# Patient Record
Sex: Female | Born: 1965 | Race: White | Hispanic: No | Marital: Single | Smoking: Never smoker
Health system: Southern US, Community
[De-identification: ages and names within clinical notes are randomized; demographics above are authoritative.]

## PROBLEM LIST (undated history)

## (undated) DIAGNOSIS — C50919 Malignant neoplasm of unspecified site of unspecified female breast: Secondary | ICD-10-CM

## (undated) DIAGNOSIS — Z9289 Personal history of other medical treatment: Secondary | ICD-10-CM

## (undated) DIAGNOSIS — E039 Hypothyroidism, unspecified: Secondary | ICD-10-CM

## (undated) DIAGNOSIS — M7120 Synovial cyst of popliteal space [Baker], unspecified knee: Secondary | ICD-10-CM

## (undated) DIAGNOSIS — Z923 Personal history of irradiation: Secondary | ICD-10-CM

## (undated) DIAGNOSIS — Z9221 Personal history of antineoplastic chemotherapy: Secondary | ICD-10-CM

## (undated) HISTORY — DX: Personal history of other medical treatment: Z92.89

## (undated) HISTORY — DX: Synovial cyst of popliteal space (Baker), unspecified knee: M71.20

## (undated) HISTORY — PX: BREAST LUMPECTOMY: SHX2

## (undated) HISTORY — PX: REDUCTION MAMMAPLASTY: SUR839

## (undated) HISTORY — PX: BREAST BIOPSY: SHX20

## (undated) HISTORY — PX: TONSILLECTOMY: SUR1361

---

## 2004-11-30 ENCOUNTER — Ambulatory Visit: Payer: Self-pay | Admitting: Internal Medicine

## 2004-12-07 ENCOUNTER — Ambulatory Visit: Payer: Self-pay | Admitting: Internal Medicine

## 2005-09-16 ENCOUNTER — Ambulatory Visit: Payer: Self-pay | Admitting: Internal Medicine

## 2005-09-23 ENCOUNTER — Ambulatory Visit: Payer: Self-pay | Admitting: Internal Medicine

## 2005-11-21 ENCOUNTER — Ambulatory Visit: Payer: Self-pay | Admitting: Internal Medicine

## 2006-01-23 ENCOUNTER — Ambulatory Visit: Payer: Self-pay | Admitting: Internal Medicine

## 2006-05-08 ENCOUNTER — Ambulatory Visit: Payer: Self-pay | Admitting: Internal Medicine

## 2006-05-09 ENCOUNTER — Encounter: Admission: RE | Admit: 2006-05-09 | Discharge: 2006-05-09 | Payer: Self-pay | Admitting: Internal Medicine

## 2007-04-16 ENCOUNTER — Ambulatory Visit: Payer: Self-pay | Admitting: Internal Medicine

## 2007-04-17 LAB — CONVERTED CEMR LAB
CO2: 26 meq/L (ref 19–32)
Chloride: 111 meq/L (ref 96–112)
Creatinine, Ser: 0.8 mg/dL (ref 0.4–1.2)
GFR calc non Af Amer: 84 mL/min
Glucose, Bld: 91 mg/dL (ref 70–99)
Sodium: 141 meq/L (ref 135–145)

## 2007-05-23 ENCOUNTER — Ambulatory Visit: Payer: Self-pay | Admitting: Endocrinology

## 2007-06-08 ENCOUNTER — Encounter: Admission: RE | Admit: 2007-06-08 | Discharge: 2007-06-08 | Payer: Self-pay | Admitting: Endocrinology

## 2007-06-14 ENCOUNTER — Telehealth: Payer: Self-pay | Admitting: *Deleted

## 2009-01-29 ENCOUNTER — Ambulatory Visit (HOSPITAL_COMMUNITY): Admission: RE | Admit: 2009-01-29 | Discharge: 2009-01-29 | Payer: Self-pay | Admitting: Obstetrics and Gynecology

## 2010-12-05 ENCOUNTER — Encounter: Payer: Self-pay | Admitting: Internal Medicine

## 2011-03-29 NOTE — Consult Note (Signed)
[facility]                          ENDOCRINOLOGY CONSULTATION   Janet King, Janet King                      MRN:          [id number]  DATE:05/23/2007                            DOB:          05-10-1966    ENDOCRINOLOGY CONSULTATION REPORT:   REFERRING PHYSICIAN:  Dr. Neta Mends. Panosh.   REASON FOR REFERRAL:  This is a 45 year old woman who reports a 3-year  history of hypothyroidism.  Her Synthroid has been slowly increased to  125 mcg a day.  In June of 2008, she had a normal TSH despite missing  Synthroid about 10 times over the previous month.  Since then, she  states she has taken it daily, as prescribed.  When she missed her  Synthroid, she had several weeks of slight fatigue and associated slight  tremor of the hands, but these symptoms have now resolved.   PAST MEDICAL HISTORY:  Otherwise healthy.  No medications.   SOCIAL HISTORY:  She is single.  She works as an TEFL teacher.   FAMILY HISTORY:  Multiple family members have thyroid problems.   REVIEW OF SYSTEMS:  Denies palpitations and she denies weight gain or  weight loss.   PHYSICAL EXAMINATION:  VITAL SIGNS:  Blood pressure 137/89, heart rate  is 90, temperature is 99 degrees.  Weight is 252.  GENERAL:  No distress.  SKIN:  No rash. Not diaphoretic.  HEENT:  Eyes - no proptosis.  NECK:  Thyroid is two times normal size on the left, and three times  normal size on the right.  CHEST:  Clear to auscultation, no respiratory distress.  CARDIOVASCULAR:  No JVD, no edema, regular rate and rhythm, no murmur.  NEUROLOGIC:  Alert and oriented, does not appear anxious nor depressed  and there is no tremor.   LABORATORY STUDIES FORWARD BY DR. PANOSH:  On April 17, 2007 TSH 3.77.  Thyroid ultrasound on May 09, 2006 shows a small multinodular goiter.  TSH on May 23, 2007 is normal at 1.85.   IMPRESSION:  1. 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  The fact that      her TSH is  normal consistently taking this dosage of Synthroid      suggests that this should be her dosage for now.  2. Multinodular goiter.  It seems much bigger on examination today      than is described on the ultrasound last year, especially the right      lobe.  Symptoms of fatigue and tremor, not thyroid related.   PLAN:  1. Check thyroid ultrasound.  2. Same Synthroid.  3. If there is no significant change in thyroid, I have asked her to      return in a year.     Sean A. Everardo All, MD  Electronically Signed    SAE/MedQ  DD: 05/24/2007  DT: 05/25/2007  Job #: [id number]   cc:   Neta Mends. Fabian Sharp, MD

## 2012-04-11 ENCOUNTER — Other Ambulatory Visit (HOSPITAL_COMMUNITY): Payer: Self-pay | Admitting: Obstetrics and Gynecology

## 2012-04-11 DIAGNOSIS — E049 Nontoxic goiter, unspecified: Secondary | ICD-10-CM

## 2012-04-13 ENCOUNTER — Ambulatory Visit (HOSPITAL_COMMUNITY)
Admission: RE | Admit: 2012-04-13 | Discharge: 2012-04-13 | Disposition: A | Discharge status: Home / Self Care | Payer: BC Managed Care – PPO | Source: Ambulatory Visit | Attending: Obstetrics and Gynecology | Admitting: Obstetrics and Gynecology

## 2012-04-13 DIAGNOSIS — E049 Nontoxic goiter, unspecified: Secondary | ICD-10-CM | POA: Insufficient documentation

## 2012-06-05 ENCOUNTER — Other Ambulatory Visit: Payer: Self-pay | Admitting: Endocrinology

## 2012-06-05 DIAGNOSIS — E049 Nontoxic goiter, unspecified: Secondary | ICD-10-CM

## 2012-11-19 ENCOUNTER — Ambulatory Visit
Admission: RE | Admit: 2012-11-19 | Discharge: 2012-11-19 | Disposition: A | Discharge status: Home / Self Care | Payer: BC Managed Care – PPO | Source: Ambulatory Visit | Attending: Endocrinology | Admitting: Endocrinology

## 2012-11-19 DIAGNOSIS — E049 Nontoxic goiter, unspecified: Secondary | ICD-10-CM

## 2013-06-20 ENCOUNTER — Other Ambulatory Visit: Payer: Self-pay | Admitting: Endocrinology

## 2013-06-20 DIAGNOSIS — E010 Iodine-deficiency related diffuse (endemic) goiter: Secondary | ICD-10-CM

## 2013-11-20 ENCOUNTER — Ambulatory Visit
Admission: RE | Admit: 2013-11-20 | Discharge: 2013-11-20 | Disposition: A | Discharge status: Home / Self Care | Payer: Commercial Managed Care - PPO | Source: Ambulatory Visit | Attending: Endocrinology | Admitting: Endocrinology

## 2013-11-20 DIAGNOSIS — E010 Iodine-deficiency related diffuse (endemic) goiter: Secondary | ICD-10-CM

## 2014-12-10 ENCOUNTER — Other Ambulatory Visit: Payer: Self-pay | Admitting: Endocrinology

## 2014-12-10 DIAGNOSIS — E049 Nontoxic goiter, unspecified: Secondary | ICD-10-CM

## 2015-01-05 ENCOUNTER — Ambulatory Visit
Admission: RE | Admit: 2015-01-05 | Discharge: 2015-01-05 | Disposition: A | Discharge status: Home / Self Care | Payer: Commercial Managed Care - PPO | Source: Ambulatory Visit | Attending: Endocrinology | Admitting: Endocrinology

## 2015-01-05 DIAGNOSIS — E049 Nontoxic goiter, unspecified: Secondary | ICD-10-CM

## 2015-12-28 ENCOUNTER — Other Ambulatory Visit: Payer: Self-pay | Admitting: Obstetrics and Gynecology

## 2015-12-28 DIAGNOSIS — R928 Other abnormal and inconclusive findings on diagnostic imaging of breast: Secondary | ICD-10-CM

## 2016-01-18 ENCOUNTER — Other Ambulatory Visit: Payer: Self-pay | Admitting: Obstetrics and Gynecology

## 2016-01-18 ENCOUNTER — Ambulatory Visit
Admission: RE | Admit: 2016-01-18 | Discharge: 2016-01-18 | Disposition: A | Discharge status: Home / Self Care | Payer: Commercial Managed Care - PPO | Source: Ambulatory Visit | Attending: Obstetrics and Gynecology | Admitting: Obstetrics and Gynecology

## 2016-01-18 DIAGNOSIS — N6310 Unspecified lump in the right breast, unspecified quadrant: Secondary | ICD-10-CM

## 2016-01-18 DIAGNOSIS — R928 Other abnormal and inconclusive findings on diagnostic imaging of breast: Secondary | ICD-10-CM

## 2016-01-21 ENCOUNTER — Ambulatory Visit
Admission: RE | Admit: 2016-01-21 | Discharge: 2016-01-21 | Disposition: A | Discharge status: Home / Self Care | Payer: Commercial Managed Care - PPO | Source: Ambulatory Visit | Attending: Obstetrics and Gynecology | Admitting: Obstetrics and Gynecology

## 2016-01-21 ENCOUNTER — Other Ambulatory Visit: Payer: Self-pay | Admitting: Obstetrics and Gynecology

## 2016-01-21 DIAGNOSIS — N6310 Unspecified lump in the right breast, unspecified quadrant: Secondary | ICD-10-CM

## 2016-01-29 ENCOUNTER — Ambulatory Visit
Admission: RE | Admit: 2016-01-29 | Discharge: 2016-01-29 | Disposition: A | Discharge status: Home / Self Care | Payer: Commercial Managed Care - PPO | Source: Ambulatory Visit | Attending: General Surgery | Admitting: General Surgery

## 2016-01-29 ENCOUNTER — Other Ambulatory Visit: Payer: Self-pay | Admitting: General Surgery

## 2016-01-29 DIAGNOSIS — C50211 Malignant neoplasm of upper-inner quadrant of right female breast: Secondary | ICD-10-CM

## 2016-01-29 MED ORDER — GADOBENATE DIMEGLUMINE 529 MG/ML IV SOLN
20.0000 mL | Freq: Once | INTRAVENOUS | Status: AC | PRN
  Administered 2016-01-29: 20 mL via INTRAVENOUS

## 2016-02-01 ENCOUNTER — Telehealth: Payer: Self-pay | Admitting: Hematology and Oncology

## 2016-02-01 NOTE — Telephone Encounter (Signed)
new breast appt-patient scheduled for 03/21 @ 3:45 w/Dr. Lindi Adie. Staff message sent to [place] of appt d/t.  Referral scan.

## 2016-02-02 ENCOUNTER — Encounter: Payer: Self-pay | Admitting: Hematology and Oncology

## 2016-02-02 ENCOUNTER — Encounter: Payer: Self-pay | Admitting: *Deleted

## 2016-02-02 ENCOUNTER — Ambulatory Visit (HOSPITAL_BASED_OUTPATIENT_CLINIC_OR_DEPARTMENT_OTHER): Payer: Self-pay | Admitting: Genetic Counselor

## 2016-02-02 ENCOUNTER — Other Ambulatory Visit: Payer: Commercial Managed Care - PPO

## 2016-02-02 ENCOUNTER — Ambulatory Visit (HOSPITAL_BASED_OUTPATIENT_CLINIC_OR_DEPARTMENT_OTHER): Payer: Commercial Managed Care - PPO | Admitting: Hematology and Oncology

## 2016-02-02 VITALS — BP 127/87 | HR 106 | Temp 98.3°F | Resp 18 | Wt 258.6 lb

## 2016-02-02 DIAGNOSIS — Z800 Family history of malignant neoplasm of digestive organs: Secondary | ICD-10-CM

## 2016-02-02 DIAGNOSIS — Z809 Family history of malignant neoplasm, unspecified: Secondary | ICD-10-CM

## 2016-02-02 DIAGNOSIS — Z170 Estrogen receptor positive status [ER+]: Secondary | ICD-10-CM | POA: Diagnosis not present

## 2016-02-02 DIAGNOSIS — C50411 Malignant neoplasm of upper-outer quadrant of right female breast: Secondary | ICD-10-CM

## 2016-02-02 DIAGNOSIS — Z803 Family history of malignant neoplasm of breast: Secondary | ICD-10-CM

## 2016-02-02 DIAGNOSIS — Z315 Encounter for genetic counseling: Secondary | ICD-10-CM

## 2016-02-02 DIAGNOSIS — C50211 Malignant neoplasm of upper-inner quadrant of right female breast: Secondary | ICD-10-CM | POA: Insufficient documentation

## 2016-02-02 NOTE — Assessment & Plan Note (Signed)
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 T2 N0 stage II a clinical stage Mammogram 01/14/2016: 3 x 4 x 5 mm area of irregular mass right breast 1:00 position  Pathology and radiology review:Discussed with the patient, the details of pathology including the type of breast cancer,the clinical staging, the significance of ER, PR and HER-2/neu receptors and the implications for treatment. After reviewing the pathology in detail, we proceeded to discuss the different treatment options between surgery, radiation, chemotherapy, antiestrogen therapies.  Recommendation: 1. Breast conserving surgery 2. Followed by adjuvant systemic chemotherapy with Herceptin. Depending on the final tumor size, she may receive Abraxane Herceptin weekly 12 followed by Herceptin maintenance or she may get TCH 6 cycles followed by Herceptin maintenance or she may also get TCH Perjeta 6 cycles followed by Herceptin maintenance (the final tumor size greater than 2 cm) 3. Followed by adjuvant radiation therapy 4. Followed by adjuvant antiestrogen therapy  Chemotherapy counseling: I briefly discussed the risks and benefits of chemotherapy not going to do much detail because it is unclear what the final chemotherapy would be a cause of the variability in the tumor size with a mammogram to the breast MRI. We will finalize a treatment plan based upon the final pathology report.  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 This would require significant changes in her lifestyle to go through these treatments. Our goal is to make sure that she can maintain her employment throughout her treatment  plan. Return to clinic after surgery to discuss the final pathology report and to finalize adjuvant treatment plan. I discussed with her that I would request Dr. Wakefield to place a port.     

## 2016-02-02 NOTE — Progress Notes (Signed)
[place] NOTE  Consult requested by Dr. Donne Hazel CHIEF COMPLAINTS/PURPOSE OF CONSULTATION:  Newly diagnosed breast cancer  HISTORY OF PRESENTING ILLNESS:  Janet King 50 y.o. female is here because of recent diagnosis of right breast cancer. She had routine screening mammogram that revealed an abnormality in the right breast measuring 5 mm in size. She subsequently underwent ultrasound and ultrasound-guided biopsy. Final pathology came back as invasive lobular cancer that was ER positive PR positive and HER-2 positive. She saw Dr. Donne Hazel who obtained a breast MRI on 02/01/2016. MRI revealed a 2.5 cm area of postbiopsy hematoma. It is unclear how much of that is truly breast cancer. 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 She is single and does not have any children. She is here today by herself.  I reviewed her records extensively and collaborated the history with the patient.  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 T2 N0 stage II a clinical stage    In terms of breast cancer risk profile:  She menarched at early age of 18  She had no pregnancies  She has not received birth control pills.  She was never exposed to fertility medications or hormone replacement therapy.  She has  family history of Breast/GYN/GI cancer She has a mother age 75 with breast cancer.  MEDICAL HISTORY: Hypothyroidism and Baker's cyst of the knee  SURGICAL HISTORY: Tonsillectomy  SOCIAL HISTORY: Denies any tobacco alcohol or recreational drug use.  She has not been married. 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  FAMILY HISTORY: Mother breast cancer at 71  ALLERGIES:  is allergic to amoxicillin and penicillins.  MEDICATIONS: Synthroid 112 g daily, vitamin D 50,000 weekly, fish oil occasionally  REVIEW OF SYSTEMS:   Constitutional: Denies fevers, chills or abnormal night sweats Eyes: Denies blurriness of vision, double vision or watery eyes Ears, nose, mouth, throat, and face: Denies mucositis or sore throat Respiratory: Denies cough, dyspnea or wheezes Cardiovascular: Denies palpitation, chest discomfort or lower extremity swelling Gastrointestinal:  Denies nausea, heartburn or change in bowel habits Skin: Denies abnormal skin rashes Lymphatics: Denies new lymphadenopathy or easy bruising Neurological:Denies numbness, tingling or new weaknesses Behavioral/Psych: Mood is stable, no new changes  Breast:  Denies any palpable lumps or discharge All other systems were reviewed with the patient and are negative.  PHYSICAL EXAMINATION: ECOG PERFORMANCE STATUS: 0 - Asymptomatic  Filed Vitals:   02/02/16 1600  BP: 127/87  Pulse: 106  Temp: 98.3 F (36.8 C)  Resp: 18   Filed Weights   02/02/16 1600  Weight: 258 lb 9.6 oz (117.3 kg)    GENERAL:alert, no distress and comfortable SKIN: skin color, texture, turgor are normal, no rashes or significant lesions EYES: normal, conjunctiva are pink and non-injected, sclera clear OROPHARYNX:no exudate, no erythema and lips, buccal mucosa, and tongue normal  NECK: supple, thyroid normal size, non-tender, without nodularity LYMPH:  no palpable lymphadenopathy in the cervical, axillary or inguinal LUNGS: clear to auscultation and percussion with normal breathing effort HEART: regular  rate & rhythm and no murmurs and no lower extremity edema ABDOMEN:abdomen soft, non-tender and normal bowel  sounds Musculoskeletal:no cyanosis of digits and no clubbing  PSYCH: alert & oriented x 3 with fluent speech NEURO: no focal motor/sensory deficits BREAST: No palpable nodules in breast. 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 T2 N0 stage II a clinical stage Mammogram 01/14/2016: 3 x 4 x 5 mm area of irregular mass right breast 1:00 position  Pathology and radiology review:Discussed with the patient, the details of pathology including the type of breast cancer,the clinical staging, the significance of ER, PR and HER-2/neu receptors and the implications for treatment. After reviewing the pathology in detail, we proceeded to discuss the different treatment options between surgery, radiation, chemotherapy, antiestrogen therapies.  Recommendation: 1. Breast conserving surgery 2. Followed by adjuvant systemic chemotherapy with Herceptin. Depending on the final tumor size, she may receive Abraxane Herceptin weekly 12 followed by Herceptin maintenance or she may get Enders 6 cycles followed by Herceptin maintenance or she may also get [facility] Perjeta 6 cycles followed by Herceptin maintenance (the final tumor size greater than 2 cm) 3. Followed by adjuvant radiation therapy 4. Followed by adjuvant antiestrogen therapy  Chemotherapy counseling: I briefly discussed the risks and  benefits of chemotherapy not going to do much detail because it is unclear what the final chemotherapy would be a cause of the variability in the tumor size with a mammogram to the breast MRI. We will finalize a treatment plan based upon the final pathology report.  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 This would require significant changes in her lifestyle to go through these treatments. Our goal is to make sure that she can maintain her employment throughout her treatment plan.  Return to clinic after surgery to discuss the final pathology report and to finalize adjuvant treatment plan. I discussed with her that I would request Dr. Donne Hazel to place a port.  All questions were answered. The patient knows to call the clinic with any problems, questions or concerns.    Rulon Eisenmenger, MD 02/02/2016

## 2016-02-03 ENCOUNTER — Encounter: Payer: Self-pay | Admitting: Radiation Oncology

## 2016-02-03 ENCOUNTER — Encounter: Payer: Self-pay | Admitting: Genetic Counselor

## 2016-02-03 DIAGNOSIS — Z803 Family history of malignant neoplasm of breast: Secondary | ICD-10-CM | POA: Insufficient documentation

## 2016-02-03 DIAGNOSIS — Z800 Family history of malignant neoplasm of digestive organs: Secondary | ICD-10-CM | POA: Insufficient documentation

## 2016-02-03 NOTE — Progress Notes (Signed)
Location of Breast Cancer: right breast cancer  Histology per Pathology Report:   01/21/16 Diagnosis Breast, right, needle core biopsy, medial central breast INVASIVE LOBULAR CARCINOMA  Receptor Status: ER(95%), PR (95%), Her2-neu (positive)  Did patient present with symptoms (if so, please note symptoms) or was this found on screening mammography?: screening mammography  Past/Anticipated interventions by surgeon, if any: will be seeing Dr. Donne Hazel tomorrow morning.  Past/Anticipated interventions by medical oncology, if any: Dr. Lindi Adie is recommending "1. Breast conserving surgery 2. Followed by adjuvant systemic chemotherapy with Herceptin. Depending on the final tumor size, she may receive Abraxane Herceptin weekly 12 followed by Herceptin maintenance or she may get Natalia 6 cycles followed by Herceptin maintenance or she may also get [facility] Perjeta 6 cycles followed by Herceptin maintenance (the final tumor size greater than 2 cm) 3. Followed by adjuvant radiation therapy 4. Followed by adjuvant antiestrogen therapy."  Lymphedema issues, if any:  no  Pain issues, if any:  no   OB GYN history: She menarched at age of 56. She had no pregnancies. She has not received birth control pills. She was never exposed to fertility medications or hormone replacement therapy. She has family history of Breast/GYN/GI cancer She has a mother age 24 with breast cancer.  SAFETY ISSUES:  Prior radiation? no  Pacemaker/ICD? no  Possible current pregnancy?no  Is the patient on methotrexate? no  Current Complaints / other details:   BP 131/82 mmHg  Pulse 101  Temp(Src) 98.2 F (36.8 C) (Oral)  Resp 16  Ht 5' 6.5" (1.689 m)  Wt 258 lb 11.2 oz (117.346 kg)  BMI 41.13 kg/m2  LMP 01/25/2016    Janet King, Craige Cotta, RN 02/03/2016,9:19 AM

## 2016-02-03 NOTE — Progress Notes (Signed)
REFERRING PROVIDER: Rolm Bookbinder, MD  OTHER PROVIDER(S): Nicholas Lose, MD  PRIMARY PROVIDER:  No primary care provider on file.  PRIMARY REASON FOR VISIT:  1. Breast cancer of upper-outer quadrant of right female breast ([place])   2. Family history of breast cancer in mother   57. Family history of colon cancer   4. Family history of cancer      HISTORY OF PRESENT ILLNESS:   Janet King, a 50 y.o. female, was seen for a [place] cancer genetics consultation at the request of Dr. Donne Hazel due to a personal history of breast cancer at 75 and family history of breast, colon, and other cancers.  Janet King presents to clinic today to discuss the possibility of a hereditary predisposition to cancer, genetic testing, and to further clarify her future cancer risks, as well as potential cancer risks for family members.   In March 2017, at the age of 10, Janet King was diagnosed with invasive lobular carcinoma of the right breast. Hormone receptor status was triple positive.  Genetic testing will help inform surgical and treatment decisions.    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 T2 N0 stage II a clinical stage     HORMONAL RISK FACTORS:  Menarche was at age 72.  First live birth - no children.  OCP use for approximately 0 years.  Ovaries intact: yes.  Hysterectomy: no.  Menopausal status: premenopausal.  HRT use: 0 years. Colonoscopy: n/a; not examined. Mammogram within the last year: yes. Number of breast biopsies: 1. Up to date with pelvic exams:  yes. Any excessive radiation exposure in the past:  no, but does report a history of secondhand smoke  exposure when she was younger  Past Medical History  Diagnosis Date  . Breast cancer ([place])     right    History reviewed. No pertinent past surgical history.  Social History   Social History  . Marital Status: Single    Spouse Name: N/A  . Number of Children: N/A  . Years of Education: N/A   Social History Main Topics  . Smoking status: Never Smoker   . Smokeless tobacco: Never Used  . Alcohol Use: Yes     Comment: occ - once every 3 mos  . Drug Use: None  . Sexual Activity: Not Asked   Other Topics Concern  . None   Social History Narrative     FAMILY HISTORY:  We obtained a detailed, 4-generation family history.  Significant diagnoses are listed below: Family History  Problem Relation Age of Onset  . Breast cancer Mother 93  . Heart disease Maternal Grandfather   . Colon cancer Paternal Grandmother     dx. 50s  . Cancer Other     maternal great aunt (MGM's sister) dx. with NOS cancer at older age  . Breast cancer Other     maternal great aunt (MGF's sister) dx. in her 84s; s/p mastectomy  . Cancer Other     paternal great aunt (PGM's sister) dx. with NOS cancer at older age    Janet King has on full sister who is currently 75 and who has never had cancer.  Her sister also has  no children.  Janet King mother is currently 63; she has a history of breast cancer which was diagnosed at 16.  Janet King father is currently 42 and he has never had cancer.  Janet King mother is an only child.  Janet King maternal grandmother died in her 28s-80s, but never had cancer.  She had two full sisters; one of these sisters had a history of an unspecified type of cancer diagnosed at an "older" age.  Janet King maternal grandfather died of heart disease in his 10s.  He had one full brother and sister; his sister died at a later age, but had a history of breast cancer in her 31s for which she underwent a mastectomy.    Janet King father has one full sister who is  currently 60 and has never had cancer.  She has no children.  Janet King paternal grandmother died of colon cancer in her 87s.  She had two full sisters and one full brother; one sister was diagnosed with an unspecified type of cancer at an "older" age.  Janet King paternal grandfather died in his 1s.  Janet King reports not additional known familial cancer history.  She is unaware of any previous familial genetic testing for hereditary cancers.  Patient's maternal ancestors are of Caucasian descent, and paternal ancestors are of Caucasian/German descent. There is no reported Ashkenazi Jewish ancestry. There is no known consanguinity.  GENETIC COUNSELING ASSESSMENT: Janet King is a 50 y.o. female with a personal and family history of breast cancer which is somewhat suggestive of a hereditary breast cancer syndrome and predisposition to cancer. We, therefore, discussed and recommended the following at today's visit.   DISCUSSION: We reviewed the characteristics, features and inheritance patterns of hereditary cancer syndromes, particularly those caused by mutations within the BRCA1/2, CHEK2, and PALB2 genes. We also discussed genetic testing, including the appropriate family members to test, the process of testing, insurance coverage and turn-around-time for results. We discussed the implications of a negative, positive and/or variant of uncertain significant result. We recommended Janet King pursue genetic testing for the 20-gene Breast/Ovarian Cancer Panel through [facility].  The Breast/Ovarian Cancer Panel offered by [facility] Hope Pigeon, MD) includes sequencing and deletion/duplication analysis for the following 19 genes:  ATM, BARD1, BRCA1, BRCA2, BRIP1, CDH1, CHEK2, FANCC, MLH1, MSH2, MSH6, NBN, PALB2, PMS2, PTEN, RAD51C, RAD51D, TP53, and XRCC2.  This panel also includes deletion/duplication analysis (without sequencing) for one gene, EPCAM.   Based on Janet King's  personal and family history of cancer, she meets medical criteria for genetic testing. Despite that she meets criteria, she may still have an out of pocket cost. We discussed that if her out of pocket cost for testing is over $100, the laboratory will call and confirm whether she wants to proceed with testing.  If the out of pocket cost of testing is less than $100 she will be billed by the genetic testing laboratory.   PLAN: After considering the risks, benefits, and limitations, Janet King  provided informed consent to pursue genetic testing and the blood sample was sent to [facility] for analysis of the 20-gene Breast/Ovarian Cancer Panel. Results should be available within approximately 2 weeks' time, at which point they will be disclosed by telephone to Ms. Stallings, as will any additional recommendations warranted by these results. Ms. Karn will receive a summary of her genetic counseling visit and a copy of her results once available. This information will also be available in Epic. We encouraged  Ms. Rinke to remain in contact with cancer genetics annually so that we can continuously update the family history and inform her of any changes in cancer genetics and testing that may be of benefit for her family. Ms. Reinhold questions were answered to her satisfaction today. Our contact information was provided should additional questions or concerns arise.  Thank you for the referral and allowing [place] to share in the care of your patient.   Jeanine Luz, MS, [facility] Certified Genetic Counselor [email or web address] Phone: [phone]  The patient was seen for a total of 50 minutes in face-to-face genetic counseling.  This patient was discussed with Drs. Magrinat, Lindi Adie and/or Burr Medico who agrees with the above.    _______________________________________________________________________ For Office Staff:  Number of people involved in session: 1 Was an Intern/ student involved with case:  no

## 2016-02-04 ENCOUNTER — Encounter: Payer: Self-pay | Admitting: Radiation Oncology

## 2016-02-04 ENCOUNTER — Ambulatory Visit
Admission: RE | Admit: 2016-02-04 | Discharge: 2016-02-04 | Disposition: A | Discharge status: Home / Self Care | Payer: Commercial Managed Care - PPO | Source: Ambulatory Visit | Attending: Radiation Oncology | Admitting: Radiation Oncology

## 2016-02-04 VITALS — BP 131/82 | HR 101 | Temp 98.2°F | Resp 16 | Ht 66.5 in | Wt 258.7 lb

## 2016-02-04 DIAGNOSIS — C50411 Malignant neoplasm of upper-outer quadrant of right female breast: Secondary | ICD-10-CM

## 2016-02-04 DIAGNOSIS — C50211 Malignant neoplasm of upper-inner quadrant of right female breast: Secondary | ICD-10-CM

## 2016-02-04 DIAGNOSIS — Z170 Estrogen receptor positive status [ER+]: Secondary | ICD-10-CM | POA: Insufficient documentation

## 2016-02-04 DIAGNOSIS — E039 Hypothyroidism, unspecified: Secondary | ICD-10-CM | POA: Diagnosis not present

## 2016-02-04 DIAGNOSIS — C50911 Malignant neoplasm of unspecified site of right female breast: Secondary | ICD-10-CM | POA: Insufficient documentation

## 2016-02-04 HISTORY — DX: Hypothyroidism, unspecified: E03.9

## 2016-02-04 HISTORY — DX: Malignant neoplasm of unspecified site of unspecified female breast: C50.919

## 2016-02-04 NOTE — Progress Notes (Signed)
Please see the Nurse Progress Note in the MD Initial Consult Encounter for this patient. 

## 2016-02-04 NOTE — Progress Notes (Signed)
Radiation Oncology         [phone] ________________________________  Initial Outpatient Consultation  Name: Janet King MRN: [id number]  Date: 02/04/2016  DOB: 1966-09-26  CC:No primary care provider on file.  Rolm Bookbinder, MD   REFERRING PHYSICIAN: Rolm Bookbinder, MD  DIAGNOSIS: T2 N0 stage II a clinical stage Invasive lobular carcinoma of the right breast    Breast cancer of upper-inner quadrant of right female breast ([place])   01/21/2016 Initial Diagnosis Right breast biopsy: Invasive lobular cancer, ER 95%, PR 95%, Ki-67 5%, HER-2 positive ratio 2.52   02/01/2016 Breast MRI Right breast: Post biopsy hematoma measuring 1.9 x 1.3 x 2.5 cm surrounding ring of reactive enhancement, no lymphadenopathy. T2 N0 stage II a clinical stage   HISTORY OF PRESENT ILLNESS:: Janet King is a 50 y.o. female who is seen Out of the courtesy of Dr. Donne Hazel  for an initial consultation visit regarding the patient's diagnosis of breast cancer.  The patient was found to have suspicious findings within the right breast on initial mammogram. The patient has not had symptoms prior to this study, this was found on screening mammogram. A diagnostic mammogram and breast ultrasound confirmed this finding. On ultrasound, the tumor measured 5 mm and was present in the upper inner quadrant.  A biopsy was performed. This revealed invasive lobular carcinoma. Receptors studies were completed and indicate that the tumor is estrogen receptor positive, progesterone receptor positive, and Her-2/neu positive.   MRI scan of the breasts was performed on 01/29/16, this confirmed invasive lobular carcinoma present in the posterior upper inner right breast. There was post-biopsy hematoma noted. No evidence of multifocal disease, no disease found in the left breast  OB GYN History: She menarched at age of 75. She had no pregnancies. She has not received birth control pills. She was never exposed to fertility  medications or hormone replacement therapy. She has family history of Breast/GYN/GI cancer  She has a mother age 56 with breast cancer.  PREVIOUS RADIATION THERAPY: No  PAST MEDICAL HISTORY:  has a past medical history of Breast cancer ([place]) and Hypothyroidism.    PAST SURGICAL HISTORY:No past surgical history on file.  FAMILY HISTORY: family history includes Breast cancer in her other; Breast cancer (age of onset: 5) in her mother; Cancer in her other and other; Colon cancer in her paternal grandmother; Heart disease in her maternal grandfather.  SOCIAL HISTORY:  reports that she has never smoked. She has never used smokeless tobacco. She reports that she drinks alcohol.  ALLERGIES: Amoxicillin and Penicillins  MEDICATIONS:  Current Outpatient Prescriptions  Medication Sig Dispense Refill  . SYNTHROID 112 MCG tablet   0  . Vitamin D, Ergocalciferol, (DRISDOL) 50000 units CAPS capsule Take 50,000 Units by mouth once a week.  0   No current facility-administered medications for this encounter.    REVIEW OF SYSTEMS:  A 15 point review of systems is documented in the electronic medical record. This was obtained by the nursing staff. However, I reviewed this with the patient to discuss relevant findings and make appropriate changes.  Pertinent items are noted in HPI.   PHYSICAL EXAM:  height is 5' 6.5" (1.689 m) and weight is 258 lb 11.2 oz (117.346 kg). Her oral temperature is 98.2 F (36.8 C). Her blood pressure is 131/82 and her pulse is 101. Her respiration is 16.   General: Alert and oriented, in no acute distress HEENT: Head is normocephalic. Extraocular movements are intact. Oropharynx is clear.  Neck: Neck is supple, no palpable cervical or supraclavicular lymphadenopathy.  Heart: Regular in rate and rhythm with no murmurs, rubs, or gallops. Chest: Clear to auscultation bilaterally, with no rhonchi, wheezes, or rales. Abdomen: Soft, nontender, nondistended, with no rigidity or  guarding. Extremities: No cyanosis or edema. Lymphatics: see Neck Exam Skin: No concerning lesions. Musculoskeletal: symmetric strength and muscle tone throughout. Neurologic: Cranial nerves II through XII are grossly intact. No obvious focalities. Speech is fluent. Coordination is intact. Psychiatric: Judgment and insight are intact. Affect is appropriate. Breast: An area of bruising in the upper inner quadrant of the right breast. No palpable mass appreciated. No nipple discharge or bleeding. Left breast is unremarkable. Both breasts are large and pendulous  ECOG = 0  LABORATORY DATA:  No results found for: WBC, HGB, HCT, MCV, PLT, NEUTROABS Lab Results  Component Value Date   NA 141 04/17/2007   K 4.1 04/17/2007   CL 111 04/17/2007   CO2 26 04/17/2007   GLUCOSE 91 04/17/2007   CREATININE 0.8 04/17/2007   CALCIUM 9.1 04/17/2007      RADIOGRAPHY: Mr Breast Bilateral W Wo Contrast  02/01/2016  CLINICAL DATA:  Newly diagnosed invasive lobular carcinoma of the upper inner right breast. Staging exam. LABS:  Creatinine, 0.8 mg/dl drawn on 01/27/2016 EXAM: BILATERAL BREAST MRI WITH AND WITHOUT CONTRAST TECHNIQUE: Multiplanar, multisequence MR images of both breasts were obtained prior to and following the intravenous administration of 20 ml of MultiHance. THREE-DIMENSIONAL MR IMAGE RENDERING ON INDEPENDENT WORKSTATION: Three-dimensional MR images were rendered by post-processing of the original MR data on an independent workstation. The three-dimensional MR images were interpreted, and findings are reported in the following complete MRI report for this study. Three dimensional images were evaluated at the independent DynaCad workstation COMPARISON:  Previous mammograms and breast ultrasound. FINDINGS: Breast composition: c. Heterogeneous fibroglandular tissue. Background parenchymal enhancement: Marked Right breast: There is a post biopsy hematoma in the posterior upper outer right breast  measuring 19 x 13 x 25 mm. There is a surrounding ring of reactive enhancement. Clip susceptibility artifact lies along the posterior margin of the biopsy hematoma. There are no masses or other areas of abnormal enhancement within the right breast. Left breast: No mass or abnormal enhancement. Lymph nodes: No abnormal appearing lymph nodes. Ancillary findings:  None. IMPRESSION: 1. Newly diagnosed invasive lobular carcinoma of the posterior upper inner right breast reflected by a post biopsy hematoma with surrounding reactive enhancement. 2. No evidence of multifocal or multicentric disease. No mass or abnormal enhancement in the left breast to suggest contralateral disease. RECOMMENDATION: Treatment as planned for the known right breast carcinoma. BI-RADS CATEGORY  6: Known biopsy-proven malignancy. Electronically Signed   By: Lajean Manes M.D.   On: 02/01/2016 09:29   US Breast Ltd Uni Right Inc Axilla  01/18/2016  CLINICAL DATA:  Patient was called back from screening mammogram for a right breast mass. Family history of breast cancer. The patient mother was diagnosed with breast cancer at the age of 65. EXAM: DIGITAL DIAGNOSTIC RIGHT MAMMOGRAM WITH 3D TOMOSYNTHESIS ULTRASOUND RIGHT BREAST COMPARISON:  Previous exam(s). ACR Breast Density Category c: The breast tissue is heterogeneously dense, which may obscure small masses. FINDINGS: Spot compression views of the upper-inner quadrant of the right breast was performed. There is persistence of a 5 mm spiculated mass in the posterior third of the upper inner quadrant. On physical exam, I do not palpate a mass in the upper-inner quadrant of the left breast. Targeted ultrasound  is performed, showing a hypoechoic irregular mass in the right breast 1 o'clock 7 cm from the nipple measuring 3 x 4 x 5 mm. Sonographic evaluation the right axilla does not show any enlarged adenopathy. IMPRESSION: Suspicious mass in the upper-inner quadrant of the right breast.  RECOMMENDATION: Right breast mass better seen mammographically. Stereotactic biopsy is recommended. This will be scheduled at the patient's convenience. I have discussed the findings and recommendations with the patient. Results were also provided in writing at the conclusion of the visit. If applicable, a reminder letter will be sent to the patient regarding the next appointment. BI-RADS CATEGORY  4: Suspicious. Electronically Signed   By: Lillia Mountain M.D.   On: 01/18/2016 10:36   Mm Diag Breast Tomo Uni Right  01/21/2016  CLINICAL DATA:  Evaluate clip placement. EXAM: DIAGNOSTIC RIGHT MAMMOGRAM POST STEREOTACTIC BIOPSY COMPARISON:  Previous exam(s). FINDINGS: Mammographic images were obtained following stereotactic guided biopsy of a medial right breast mass. The X shaped biopsy clip migrated 3.9 cm superior to the expected location of the mass. IMPRESSION: Biopsy clip placement as described above. 3.9 cm of superior migration identified. Final Assessment: Post Procedure Mammograms for Marker Placement Electronically Signed   By: Dorise Bullion III M.D   On: 01/21/2016 10:37   Mm Diag Breast Tomo Uni Right  01/18/2016  CLINICAL DATA:  Patient was called back from screening mammogram for a right breast mass. Family history of breast cancer. The patient mother was diagnosed with breast cancer at the age of 2. EXAM: DIGITAL DIAGNOSTIC RIGHT MAMMOGRAM WITH 3D TOMOSYNTHESIS ULTRASOUND RIGHT BREAST COMPARISON:  Previous exam(s). ACR Breast Density Category c: The breast tissue is heterogeneously dense, which may obscure small masses. FINDINGS: Spot compression views of the upper-inner quadrant of the right breast was performed. There is persistence of a 5 mm spiculated mass in the posterior third of the upper inner quadrant. On physical exam, I do not palpate a mass in the upper-inner quadrant of the left breast. Targeted ultrasound is performed, showing a hypoechoic irregular mass in the right breast 1 o'clock 7  cm from the nipple measuring 3 x 4 x 5 mm. Sonographic evaluation the right axilla does not show any enlarged adenopathy. IMPRESSION: Suspicious mass in the upper-inner quadrant of the right breast. RECOMMENDATION: Right breast mass better seen mammographically. Stereotactic biopsy is recommended. This will be scheduled at the patient's convenience. I have discussed the findings and recommendations with the patient. Results were also provided in writing at the conclusion of the visit. If applicable, a reminder letter will be sent to the patient regarding the next appointment. BI-RADS CATEGORY  4: Suspicious. Electronically Signed   By: Lillia Mountain M.D.   On: 01/18/2016 10:36   Mm Rt Breast Bx W Loc Dev 1st Lesion Image Bx Spec Stereo Guide  01/25/2016  ADDENDUM REPORT: 01/25/2016 14:33 ADDENDUM: Pathology revealed invasive lobular carcinoma in the right breast. This was found to be concordant by Dr. Dorise Bullion. Pathology results were discussed with the patient by telephone. The patient reported doing well after the biopsy with tenderness at the site. Post biopsy instructions and care were reviewed and questions were answered. The patient was encouraged to call [facility] for any additional concerns. Surgical consultation has been arranged with Dr. Rolm Bookbinder at [facility] on January 29, 2016. The patient was encouraged to come to [facility] for educational materials. Bilateral breast MRI could be useful with  the diagnosis of invasive lobular carcinoma and will be left to the discretion of the surgeon. Pathology results reported by Susa Raring RN, BSN on 01/25/2016. Electronically Signed   By: Dorise Bullion III M.D   On: 01/25/2016 14:33  01/25/2016  CLINICAL DATA:  Medial right breast mass EXAM: RIGHT BREAST STEREOTACTIC CORE NEEDLE BIOPSY COMPARISON:  Previous exams. FINDINGS: The patient and I discussed the procedure  of stereotactic-guided biopsy including benefits and alternatives. We discussed the high likelihood of a successful procedure. We discussed the risks of the procedure including infection, bleeding, tissue injury, clip migration, and inadequate sampling. Informed written consent was given. The usual time out protocol was performed immediately prior to the procedure. Using sterile technique and 1% Lidocaine as local anesthetic, under stereotactic guidance, a 9 gauge vacuum assisted device was used to perform core needle biopsy of a mass in the medial central right breast using a superior approach. No specimen radiograph was obtained. At the conclusion of the procedure, a X shaped tissue marker clip was deployed into the biopsy cavity. Follow-up 2-view mammogram was performed and dictated separately. IMPRESSION: Stereotactic-guided biopsy of a medial right breast mass. No apparent complications. Electronically Signed: By: Dorise Bullion III M.D On: 01/21/2016 10:36   IMPRESSION: The patient has been diagnosed with T2 N0 stage II a clinical stage invasive lobular carcinoma of the right breast. The patient would be a good candidate to receive radiation treatment to the right breast as part of breast conservation therapy. She is unsure at this time whether she wants a lumpectomy or mastectomy. She is meeting with Dr.Wakefield tomorrow. The patient's mother did have bilateral mastectomies as part of the management of her breast cancer and the patient is aware of whats involved with this surgery and implications of the mastectomy.  PLAN: We discussed the possible side effects and risks of treatment in addition to the possible benefits of treatment. We discussed the protocol for radiation treatment.  All of the patient's questions were answered.   Recommendations: 1. Breast conserving surgery or mastectomy if the patient's so chooses 2 Adjuvant systemic chemotherapy with Herceptin  per Dr. Geralyn Flash  recommendation 3. Followed by adjuvant radiation therapy if breast conserving surgery 4. Follow by adjuvant antiestrogen therapy     ------------------------------------------------  Blair Promise, PhD, MD  This document serves as a record of services personally performed by Gery Pray, MD. It was created on his behalf by Derek Mound, a trained medical scribe. The creation of this record is based on the scribe's personal observations and the provider's statements to them. This document has been checked and approved by the attending provider.

## 2016-02-09 ENCOUNTER — Other Ambulatory Visit: Payer: Self-pay | Admitting: General Surgery

## 2016-02-09 DIAGNOSIS — C50211 Malignant neoplasm of upper-inner quadrant of right female breast: Secondary | ICD-10-CM

## 2016-02-11 ENCOUNTER — Telehealth: Payer: Self-pay | Admitting: Genetic Counselor

## 2016-02-11 ENCOUNTER — Other Ambulatory Visit: Payer: Self-pay | Admitting: General Surgery

## 2016-02-11 DIAGNOSIS — C50211 Malignant neoplasm of upper-inner quadrant of right female breast: Secondary | ICD-10-CM

## 2016-02-11 NOTE — Telephone Encounter (Signed)
Ordered genetic testing in two steps (High/Moderate Risk breast genes first) with testing of remaining 12 breast cancer risk related genes second, because we had thought that Janet King's surgery would be on March 31st.   Luckily her surgery is not until 4/18, but the first part of her test result (8 genes including the BRCA1/2 genes) is back and is negative for any pathogenic mutations or uncertain changes.  Discussed that we may still get a positive result in one of the remaining genes, but that this is reassuring news so far.  I will call Janet King as soon as the final test result is back, and that result should definitely be back prior to her surgery on 4/18.  She is welcome to call me with any questions in the meantime.

## 2016-02-12 ENCOUNTER — Telehealth: Payer: Self-pay | Admitting: Hematology and Oncology

## 2016-02-12 ENCOUNTER — Encounter: Payer: Self-pay | Admitting: *Deleted

## 2016-02-12 NOTE — Telephone Encounter (Signed)
Left message to inform patient of fu visit 4/25 per 3/31 pof

## 2016-02-19 ENCOUNTER — Encounter (HOSPITAL_BASED_OUTPATIENT_CLINIC_OR_DEPARTMENT_OTHER): Payer: Self-pay | Admitting: *Deleted

## 2016-02-19 NOTE — H&P (Signed)
  Subjective:    Patient ID: Janet King is a 50 y.o. female.  HPI  Patient of Drs. Neta Ehlers, and Kinard here for consultation for breast reconstruction. Presented following screening MMG that with an abnormality in the right breast measuring 5 mm in size. US showed an irregular mass in the right breast 1 o'clock 7 cm from the nipple measuring 3 x 4 x 5 mm. Axilla normal. Biopsy showed an invasive lobular cancer ER/PR +, HER-2 +. MRI oevealed a 2.5 cm area of postbiopsy hematoma. Breast conservation planned as well as port placement. Referred for evaluation oncoplastic reconstruction. States though she has large breasts with associated shoulder and neck pain, would never have considered reduction as she is nervous/afraid of surgery.  Genetics pending, so far negative but complete panel results pending. Mother with history breast cancer and underwent bilateral mastectomies with implant reconstruction, reports she had several problems with multiple surgeries.  Current 42 D/DD Wt stable  Review of Systems     Objective:   Physical Exam  Constitutional: She is oriented to person, place, and time.  Cardiovascular: Normal rate.  Pulmonary/Chest: Effort normal.  Lymphadenopathy:  She has no axillary adenopathy.  Neurological: She is alert and oriented to person, place, and time.  Skin:  Fitzpatrick 2   R>L volume, grade 3 ptosis on right, grade 2 on left   SN to nipple R38 L 35 cm BW R 20 L 17 cm Nipple to IMF R 15 L 14 cm  Assessment:     Right breast cancer    Plan:     Plan oncoplastic reconstruction on right with left breast reduction for symmetry. I have discussed case with Dr. Donne Hazel and he feels given size, appropriate for single stage surgery. Reviewed reduction with anchor type scars, drains, post operative visits and limitations, recovery. She has active job with lots of lifting and recommend at least 3 weeks off, states she can do some work from  home. Diminished sensation nipple and breast skin, risk of nipple loss, wound healing problems, asymmetry. She will require XRT and smaller breast size may aid with this. Discussed will have some contraction of breast volume and increased firmness with radiation, less ptosis with aging. Discussed changes with wt gain, loss, aging. If she pursues partial mastectomy, highly encourage her to pursue breast reduction prior to XRT as risk of complications post would be very high. Counseled I cannot assure her bra size.  Reviewed [employer]. Pictures taken. Plan to make tentative surgery schedule but plan to wait for result of genetics. If genetics with abnormality, she will need to return to discuss post mastectomy reconstruction. Reviewed in absence of genetic finding, no proven benefit prophylactic mastectomy.  Irene Limbo, MD [facility] Plastic & Reconstructive Surgery [phone]

## 2016-02-22 ENCOUNTER — Telehealth: Payer: Self-pay | Admitting: Genetic Counselor

## 2016-02-26 ENCOUNTER — Other Ambulatory Visit: Payer: Self-pay | Admitting: General Surgery

## 2016-02-26 ENCOUNTER — Ambulatory Visit
Admission: RE | Admit: 2016-02-26 | Discharge: 2016-02-26 | Disposition: A | Discharge status: Home / Self Care | Payer: Commercial Managed Care - PPO | Source: Ambulatory Visit | Attending: General Surgery | Admitting: General Surgery

## 2016-02-26 ENCOUNTER — Ambulatory Visit: Payer: Self-pay | Admitting: Genetic Counselor

## 2016-02-26 DIAGNOSIS — C50211 Malignant neoplasm of upper-inner quadrant of right female breast: Secondary | ICD-10-CM

## 2016-02-26 DIAGNOSIS — Z800 Family history of malignant neoplasm of digestive organs: Secondary | ICD-10-CM

## 2016-02-26 DIAGNOSIS — Z1379 Encounter for other screening for genetic and chromosomal anomalies: Secondary | ICD-10-CM

## 2016-02-26 DIAGNOSIS — Z803 Family history of malignant neoplasm of breast: Secondary | ICD-10-CM

## 2016-02-26 NOTE — Telephone Encounter (Signed)
Discussed with Janet King that her genetic test result was negative for any known pathogenic mutations within any of 20 genes on the Breast/Ovarian Cancer Panel that would increase her genetic risk for breast, ovarian, or other related cancers.  One uncertain change was found in one copy of the PMS2 gene we discussed that we treat this just like a negative result and reviewed why we do that.  Encouraged Janet King to keep her phone number up-to-date with [place], so that we can call and let her know if this result gets updated in the future.  Discussed that women int the immediate family are at an increased risk for breast cancer based on the history.  Janet King sister may be eligible for additional breast cancer screening, since now her mother and her sister have both had breast cancer.  She should speak with her primary doctor about this option.  Janet King knows she is welcome to call me with any further questions.

## 2016-03-01 ENCOUNTER — Ambulatory Visit (HOSPITAL_BASED_OUTPATIENT_CLINIC_OR_DEPARTMENT_OTHER): Payer: Commercial Managed Care - PPO | Admitting: Anesthesiology

## 2016-03-01 ENCOUNTER — Ambulatory Visit (HOSPITAL_COMMUNITY): Payer: Commercial Managed Care - PPO

## 2016-03-01 ENCOUNTER — Ambulatory Visit
Admission: RE | Admit: 2016-03-01 | Discharge: 2016-03-01 | Disposition: A | Discharge status: Home / Self Care | Payer: Commercial Managed Care - PPO | Source: Ambulatory Visit | Attending: General Surgery | Admitting: General Surgery

## 2016-03-01 ENCOUNTER — Ambulatory Visit (HOSPITAL_COMMUNITY)
Admission: RE | Admit: 2016-03-01 | Discharge: 2016-03-01 | Disposition: A | Discharge status: Home / Self Care | Payer: Commercial Managed Care - PPO | Source: Ambulatory Visit | Attending: General Surgery | Admitting: General Surgery

## 2016-03-01 ENCOUNTER — Ambulatory Visit (HOSPITAL_BASED_OUTPATIENT_CLINIC_OR_DEPARTMENT_OTHER)
Admission: RE | Admit: 2016-03-01 | Discharge: 2016-03-01 | Disposition: A | Discharge status: Home / Self Care | Payer: Commercial Managed Care - PPO | Source: Ambulatory Visit | Attending: General Surgery | Admitting: General Surgery

## 2016-03-01 ENCOUNTER — Encounter (HOSPITAL_BASED_OUTPATIENT_CLINIC_OR_DEPARTMENT_OTHER): Payer: Self-pay | Admitting: *Deleted

## 2016-03-01 ENCOUNTER — Encounter (HOSPITAL_BASED_OUTPATIENT_CLINIC_OR_DEPARTMENT_OTHER)
Admission: RE | Disposition: A | Discharge status: Home / Self Care | Payer: Self-pay | Source: Ambulatory Visit | Attending: General Surgery

## 2016-03-01 DIAGNOSIS — N651 Disproportion of reconstructed breast: Secondary | ICD-10-CM | POA: Insufficient documentation

## 2016-03-01 DIAGNOSIS — Z6841 Body Mass Index (BMI) 40.0 and over, adult: Secondary | ICD-10-CM | POA: Insufficient documentation

## 2016-03-01 DIAGNOSIS — E039 Hypothyroidism, unspecified: Secondary | ICD-10-CM | POA: Diagnosis not present

## 2016-03-01 DIAGNOSIS — C50911 Malignant neoplasm of unspecified site of right female breast: Secondary | ICD-10-CM | POA: Insufficient documentation

## 2016-03-01 DIAGNOSIS — Z95828 Presence of other vascular implants and grafts: Secondary | ICD-10-CM

## 2016-03-01 DIAGNOSIS — C50211 Malignant neoplasm of upper-inner quadrant of right female breast: Secondary | ICD-10-CM

## 2016-03-01 HISTORY — PX: PERIPHERAL VASCULAR CATHETERIZATION: SHX172C

## 2016-03-01 HISTORY — PX: RADIOACTIVE SEED GUIDED PARTIAL MASTECTOMY WITH AXILLARY SENTINEL LYMPH NODE BIOPSY: SHX6520

## 2016-03-01 HISTORY — PX: BREAST RECONSTRUCTION: SHX9

## 2016-03-01 SURGERY — RADIOACTIVE SEED GUIDED PARTIAL MASTECTOMY WITH AXILLARY SENTINEL LYMPH NODE BIOPSY
Anesthesia: General | Site: Chest | Laterality: Right

## 2016-03-01 MED ORDER — BUPIVACAINE HCL (PF) 0.25 % IJ SOLN
INTRAMUSCULAR | Status: AC
  Filled 2016-03-01: qty 30

## 2016-03-01 MED ORDER — FENTANYL CITRATE (PF) 100 MCG/2ML IJ SOLN
INTRAMUSCULAR | Status: AC
  Filled 2016-03-01: qty 2

## 2016-03-01 MED ORDER — HYDROMORPHONE HCL 1 MG/ML IJ SOLN
0.2500 mg | INTRAMUSCULAR | Status: DC | PRN
  Administered 2016-03-01 (×2): 0.5 mg via INTRAVENOUS

## 2016-03-01 MED ORDER — FENTANYL CITRATE (PF) 100 MCG/2ML IJ SOLN
50.0000 ug | INTRAMUSCULAR | Status: AC | PRN
  Administered 2016-03-01 (×4): 50 ug via INTRAVENOUS
  Administered 2016-03-01: 25 ug via INTRAVENOUS
  Administered 2016-03-01 (×4): 50 ug via INTRAVENOUS
  Administered 2016-03-01 (×2): 25 ug via INTRAVENOUS
  Administered 2016-03-01: 100 ug via INTRAVENOUS
  Administered 2016-03-01: 25 ug via INTRAVENOUS

## 2016-03-01 MED ORDER — CIPROFLOXACIN IN D5W 400 MG/200ML IV SOLN
INTRAVENOUS | Status: AC
  Filled 2016-03-01: qty 200

## 2016-03-01 MED ORDER — OXYCODONE HCL 5 MG PO TABS: 5.0000 mg | ORAL_TABLET | ORAL | Status: DC | PRN

## 2016-03-01 MED ORDER — HYDROMORPHONE HCL 1 MG/ML IJ SOLN
INTRAMUSCULAR | Status: AC
  Filled 2016-03-01: qty 1

## 2016-03-01 MED ORDER — BUPIVACAINE-EPINEPHRINE (PF) 0.25% -1:200000 IJ SOLN
INTRAMUSCULAR | Status: AC
  Filled 2016-03-01: qty 30

## 2016-03-01 MED ORDER — SODIUM CHLORIDE 0.9 % IJ SOLN
INTRAMUSCULAR | Status: AC
  Filled 2016-03-01: qty 10

## 2016-03-01 MED ORDER — MIDAZOLAM HCL 2 MG/2ML IJ SOLN
1.0000 mg | INTRAMUSCULAR | Status: DC | PRN
Start: 2016-03-01 — End: 2016-03-01
  Administered 2016-03-01: 2 mg via INTRAVENOUS

## 2016-03-01 MED ORDER — BUPIVACAINE HCL (PF) 0.5 % IJ SOLN
INTRAMUSCULAR | Status: DC | PRN
  Administered 2016-03-01: 30 mL

## 2016-03-01 MED ORDER — ACETAMINOPHEN 10 MG/ML IV SOLN
INTRAVENOUS | Status: DC | PRN
  Administered 2016-03-01: 1000 mg via INTRAVENOUS

## 2016-03-01 MED ORDER — LACTATED RINGERS IV SOLN
INTRAVENOUS | Status: DC
  Administered 2016-03-01 (×4): via INTRAVENOUS

## 2016-03-01 MED ORDER — HEPARIN SOD (PORK) LOCK FLUSH 100 UNIT/ML IV SOLN
INTRAVENOUS | Status: AC
  Filled 2016-03-01: qty 5

## 2016-03-01 MED ORDER — ONDANSETRON HCL 4 MG/2ML IJ SOLN
INTRAMUSCULAR | Status: AC
  Filled 2016-03-01: qty 2

## 2016-03-01 MED ORDER — OXYCODONE HCL 5 MG PO TABS: 5.0000 mg | ORAL_TABLET | Freq: Once | ORAL | Status: DC | PRN

## 2016-03-01 MED ORDER — HEPARIN (PORCINE) IN NACL 2-0.9 UNIT/ML-% IJ SOLN
INTRAMUSCULAR | Status: DC | PRN
  Administered 2016-03-01: 1 via INTRAVENOUS

## 2016-03-01 MED ORDER — METHYLENE BLUE 0.5 % INJ SOLN
INTRAVENOUS | Status: AC
  Filled 2016-03-01: qty 10

## 2016-03-01 MED ORDER — SCOPOLAMINE 1 MG/3DAYS TD PT72: 1.0000 | MEDICATED_PATCH | Freq: Once | TRANSDERMAL | Status: DC | PRN

## 2016-03-01 MED ORDER — SUCCINYLCHOLINE CHLORIDE 20 MG/ML IJ SOLN
INTRAMUSCULAR | Status: DC | PRN
  Administered 2016-03-01: 100 mg via INTRAVENOUS

## 2016-03-01 MED ORDER — PROPOFOL 10 MG/ML IV BOLUS
INTRAVENOUS | Status: DC | PRN
  Administered 2016-03-01: 100 mg via INTRAVENOUS
  Administered 2016-03-01: 200 mg via INTRAVENOUS
  Administered 2016-03-01: 80 mg via INTRAVENOUS

## 2016-03-01 MED ORDER — PHENYLEPHRINE HCL 10 MG/ML IJ SOLN
INTRAMUSCULAR | Status: DC | PRN
  Administered 2016-03-01 (×2): 80 ug via INTRAVENOUS

## 2016-03-01 MED ORDER — HEPARIN SOD (PORK) LOCK FLUSH 100 UNIT/ML IV SOLN
INTRAVENOUS | Status: DC | PRN
  Administered 2016-03-01: 500 [IU] via INTRAVENOUS

## 2016-03-01 MED ORDER — MIDAZOLAM HCL 2 MG/2ML IJ SOLN
INTRAMUSCULAR | Status: AC
  Filled 2016-03-01: qty 2

## 2016-03-01 MED ORDER — GLYCOPYRROLATE 0.2 MG/ML IJ SOLN: 0.2000 mg | Freq: Once | INTRAMUSCULAR | Status: DC | PRN

## 2016-03-01 MED ORDER — BUPIVACAINE HCL (PF) 0.25 % IJ SOLN
INTRAMUSCULAR | Status: DC | PRN
  Administered 2016-03-01: 26 mL

## 2016-03-01 MED ORDER — HEPARIN (PORCINE) IN NACL 2-0.9 UNIT/ML-% IJ SOLN
INTRAMUSCULAR | Status: AC
  Filled 2016-03-01: qty 500

## 2016-03-01 MED ORDER — LIDOCAINE HCL (CARDIAC) 20 MG/ML IV SOLN
INTRAVENOUS | Status: DC | PRN
  Administered 2016-03-01: 70 mg via INTRAVENOUS

## 2016-03-01 MED ORDER — ACETAMINOPHEN 10 MG/ML IV SOLN
INTRAVENOUS | Status: AC
  Filled 2016-03-01: qty 100

## 2016-03-01 MED ORDER — 0.9 % SODIUM CHLORIDE (POUR BTL) OPTIME
TOPICAL | Status: DC | PRN
  Administered 2016-03-01: 1000 mL

## 2016-03-01 MED ORDER — OXYCODONE HCL 5 MG/5ML PO SOLN: 5.0000 mg | Freq: Once | ORAL | Status: DC | PRN

## 2016-03-01 MED ORDER — DEXAMETHASONE SODIUM PHOSPHATE 10 MG/ML IJ SOLN
INTRAMUSCULAR | Status: AC
  Filled 2016-03-01: qty 1

## 2016-03-01 MED ORDER — EPHEDRINE SULFATE 50 MG/ML IJ SOLN
INTRAMUSCULAR | Status: DC | PRN
  Administered 2016-03-01: 10 mg via INTRAVENOUS

## 2016-03-01 MED ORDER — ONDANSETRON HCL 4 MG/2ML IJ SOLN
INTRAMUSCULAR | Status: DC | PRN
  Administered 2016-03-01: 4 mg via INTRAVENOUS

## 2016-03-01 MED ORDER — TECHNETIUM TC 99M SULFUR COLLOID FILTERED
1.0000 | Freq: Once | INTRAVENOUS | Status: AC | PRN
  Administered 2016-03-01: 1 via INTRADERMAL

## 2016-03-01 MED ORDER — CIPROFLOXACIN IN D5W 400 MG/200ML IV SOLN
400.0000 mg | INTRAVENOUS | Status: AC
  Administered 2016-03-01: 400 mg via INTRAVENOUS

## 2016-03-01 MED ORDER — PROMETHAZINE HCL 25 MG/ML IJ SOLN: 6.2500 mg | INTRAMUSCULAR | Status: DC | PRN

## 2016-03-01 MED ORDER — DEXAMETHASONE SODIUM PHOSPHATE 4 MG/ML IJ SOLN
INTRAMUSCULAR | Status: DC | PRN
  Administered 2016-03-01: 10 mg via INTRAVENOUS

## 2016-03-01 SURGICAL SUPPLY — 77 items
APPLIER CLIP 9.375 MED OPEN (MISCELLANEOUS) ×5
APR CLP MED 9.3 20 MLT OPN (MISCELLANEOUS) ×3
BAG DECANTER FOR FLEXI CONT (MISCELLANEOUS) ×5 IMPLANT
BANDAGE ACE 6X5 VEL STRL LF (GAUZE/BANDAGES/DRESSINGS) IMPLANT
BINDER BREAST XXLRG (GAUZE/BANDAGES/DRESSINGS) ×2 IMPLANT
BLADE SURG 10 STRL SS (BLADE) ×22 IMPLANT
BLADE SURG 11 STRL SS (BLADE) ×2 IMPLANT
BLADE SURG 15 STRL LF DISP TIS (BLADE) ×3 IMPLANT
BLADE SURG 15 STRL SS (BLADE) ×10
BNDG GAUZE ELAST 4 BULKY (GAUZE/BANDAGES/DRESSINGS) ×10 IMPLANT
CANISTER SUCT 1200ML W/VALVE (MISCELLANEOUS) ×5 IMPLANT
CHLORAPREP W/TINT 26ML (MISCELLANEOUS) ×10 IMPLANT
CLIP APPLIE 9.375 MED OPEN (MISCELLANEOUS) ×3 IMPLANT
CLOSURE WOUND 1/2 X4 (GAUZE/BANDAGES/DRESSINGS) ×1
COVER BACK TABLE 60X90IN (DRAPES) ×5 IMPLANT
COVER MAYO STAND STRL (DRAPES) ×8 IMPLANT
COVER PROBE W GEL 5X96 (DRAPES) ×5 IMPLANT
DEVICE DUBIN W/COMP PLATE 8390 (MISCELLANEOUS) ×5 IMPLANT
DRAIN CHANNEL 15F RND FF W/TCR (WOUND CARE) ×7 IMPLANT
DRAPE C-ARM 42X72 X-RAY (DRAPES) ×2 IMPLANT
DRAPE LAPAROSCOPIC ABDOMINAL (DRAPES) ×5 IMPLANT
DRAPE U-SHAPE 76X120 STRL (DRAPES) ×4 IMPLANT
DRAPE UTILITY XL STRL (DRAPES) ×13 IMPLANT
DRSG PAD ABDOMINAL 8X10 ST (GAUZE/BANDAGES/DRESSINGS) ×16 IMPLANT
ELECT BLADE 4.0 EZ CLEAN MEGAD (MISCELLANEOUS)
ELECT COATED BLADE 2.86 ST (ELECTRODE) ×10 IMPLANT
ELECT REM PT RETURN 9FT ADLT (ELECTROSURGICAL) ×5
ELECTRODE BLDE 4.0 EZ CLN MEGD (MISCELLANEOUS) ×3 IMPLANT
ELECTRODE REM PT RTRN 9FT ADLT (ELECTROSURGICAL) ×6 IMPLANT
EVACUATOR SILICONE 100CC (DRAIN) ×8 IMPLANT
GLOVE BIO SURGEON STRL SZ 6 (GLOVE) ×14 IMPLANT
GLOVE BIO SURGEON STRL SZ 6.5 (GLOVE) ×2 IMPLANT
GLOVE BIO SURGEON STRL SZ7 (GLOVE) ×10 IMPLANT
GLOVE BIO SURGEONS STRL SZ 6.5 (GLOVE) ×2
GLOVE BIOGEL PI IND STRL 7.5 (GLOVE) ×3 IMPLANT
GLOVE BIOGEL PI INDICATOR 7.5 (GLOVE) ×2
GOWN STRL REUS W/ TWL LRG LVL3 (GOWN DISPOSABLE) ×12 IMPLANT
GOWN STRL REUS W/TWL LRG LVL3 (GOWN DISPOSABLE) ×20
KIT MARKER MARGIN INK (KITS) ×5 IMPLANT
KIT PORT POWER 8FR ISP CVUE (Catheter) ×2 IMPLANT
LIQUID BAND (GAUZE/BANDAGES/DRESSINGS) ×12 IMPLANT
NDL HYPO 25X1 1.5 SAFETY (NEEDLE) ×2 IMPLANT
NEEDLE HYPO 25X1 1.5 SAFETY (NEEDLE) ×5 IMPLANT
NS IRRIG 1000ML POUR BTL (IV SOLUTION) ×2 IMPLANT
PACK BASIN DAY SURGERY FS (CUSTOM PROCEDURE TRAY) ×7 IMPLANT
PEN SKIN MARKING BROAD TIP (MISCELLANEOUS) ×2 IMPLANT
PENCIL BUTTON HOLSTER BLD 10FT (ELECTRODE) ×10 IMPLANT
PIN SAFETY STERILE (MISCELLANEOUS) ×5 IMPLANT
SHEET MEDIUM DRAPE 40X70 STRL (DRAPES) ×3 IMPLANT
SLEEVE SCD COMPRESS KNEE MED (MISCELLANEOUS) ×8 IMPLANT
SPONGE LAP 18X18 X RAY DECT (DISPOSABLE) ×18 IMPLANT
SPONGE LAP 4X18 X RAY DECT (DISPOSABLE) ×5 IMPLANT
STAPLER VISISTAT 35W (STAPLE) ×7 IMPLANT
STRIP CLOSURE SKIN 1/2X4 (GAUZE/BANDAGES/DRESSINGS) ×4 IMPLANT
SUT ETHILON 2 0 FS 18 (SUTURE) ×7 IMPLANT
SUT MNCRL AB 4-0 PS2 18 (SUTURE) ×22 IMPLANT
SUT PROLENE 2 0 SH DA (SUTURE) ×2 IMPLANT
SUT SILK 2 0 SH (SUTURE) ×2 IMPLANT
SUT VIC AB 2-0 SH 27 (SUTURE) ×15
SUT VIC AB 2-0 SH 27XBRD (SUTURE) ×3 IMPLANT
SUT VIC AB 3-0 PS1 18 (SUTURE) ×60
SUT VIC AB 3-0 PS1 18XBRD (SUTURE) ×20 IMPLANT
SUT VIC AB 3-0 SH 27 (SUTURE) ×25
SUT VIC AB 3-0 SH 27X BRD (SUTURE) ×7 IMPLANT
SUT VICRYL 4-0 PS2 18IN ABS (SUTURE) ×11 IMPLANT
SYR 50ML LL SCALE MARK (SYRINGE) IMPLANT
SYR 5ML LUER SLIP (SYRINGE) ×3 IMPLANT
SYR BULB IRRIGATION 50ML (SYRINGE) ×3 IMPLANT
SYR CONTROL 10ML LL (SYRINGE) ×5 IMPLANT
TAPE MEASURE VINYL STERILE (MISCELLANEOUS) ×2 IMPLANT
TOWEL OR 17X24 6PK STRL BLUE (TOWEL DISPOSABLE) ×15 IMPLANT
TOWEL OR NON WOVEN STRL DISP B (DISPOSABLE) ×5 IMPLANT
TRAY FOLEY CATH SILVER 16FR (SET/KITS/TRAYS/PACK) ×2 IMPLANT
TUBE CONNECTING 20'X1/4 (TUBING) ×1
TUBE CONNECTING 20X1/4 (TUBING) ×4 IMPLANT
UNDERPAD 30X30 (UNDERPADS AND DIAPERS) ×16 IMPLANT
YANKAUER SUCT BULB TIP NO VENT (SUCTIONS) ×7 IMPLANT

## 2016-03-01 NOTE — Anesthesia Preprocedure Evaluation (Signed)
Anesthesia Evaluation  Patient identified by MRN, date of birth, ID band Patient awake    Reviewed: Allergy & Precautions, NPO status , Patient's Chart, lab work & pertinent test results  Airway Mallampati: II  TM Distance: <3 FB Neck ROM: Full    Dental no notable dental hx.    Pulmonary neg pulmonary ROS,    Pulmonary exam normal breath sounds clear to auscultation       Cardiovascular negative cardio ROS Normal cardiovascular exam Rhythm:Regular Rate:Normal     Neuro/Psych negative neurological ROS  negative psych ROS   GI/Hepatic negative GI ROS, Neg liver ROS,   Endo/Other  Hypothyroidism Morbid obesity  Renal/GU negative Renal ROS  negative genitourinary   Musculoskeletal negative musculoskeletal ROS (+)   Abdominal   Peds negative pediatric ROS (+)  Hematology negative hematology ROS (+)   Anesthesia Other Findings   Reproductive/Obstetrics negative OB ROS                             Anesthesia Physical Anesthesia Plan  ASA: II  Anesthesia Plan: General   Post-op Pain Management: GA combined w/ Regional for post-op pain   Induction: Intravenous  Airway Management Planned: LMA  Additional Equipment:   Intra-op Plan:   Post-operative Plan: Extubation in OR  Informed Consent: I have reviewed the patients History and Physical, chart, labs and discussed the procedure including the risks, benefits and alternatives for the proposed anesthesia with the patient or authorized representative who has indicated his/her understanding and acceptance.   Dental advisory given  Plan Discussed with: CRNA and Surgeon  Anesthesia Plan Comments:         Anesthesia Quick Evaluation

## 2016-03-01 NOTE — Op Note (Signed)
Operative Note   DATE OF OPERATION: 4.18.17  LOCATION: [place] outpatient  SURGICAL DIVISION: Plastic Surgery  PREOPERATIVE DIAGNOSES:  1. Right breast cancer  POSTOPERATIVE DIAGNOSES:  same  PROCEDURE:  1. Right oncoplastic breast reconstruction 2. Left breast reduction for symmetry  SURGEON: Irene Limbo MD MBA  ASSISTANT: none  ANESTHESIA:  General.   EBL: 300 ml for entire case  COMPLICATIONS: None immediate.   INDICATIONS FOR PROCEDURE:  The patient, Janet King, is a 50 y.o. female born on [birth year], is here for right lumpectomy and sentinel node. She will undergo immediate reconstruction with breast reduction.    FINDINGS: right lumpectomy 60 g, right breast reduction 490 g, left breast reduction 490 g  DESCRIPTION OF PROCEDURE:  The patient was marked standing in the preoperative area to mark sternal notch, chest midline, anterior axillary lines, inframammary folds. The location of new nipple areolar complex was marked at level of on inframammary fold on anterior surface breast by palpation. This was marked symmetric over bilateral breasts. With aid of Wise pattern marker, location of new nipple areolar complex and vertical limbs (8 cm) were marked. The patient was taken to the operating room. SCDs were placed and IV antibiotics were given. The patient's operative site was prepped and draped in a sterile fashion. A time out was performed and all information was confirmed to be correct.   Over left breast, superomedial pedicle marked and nipple areolar complex marked with 50 mm diameter marker. Pedicle deepithlialized and developedto 5-6 cm thickness and dissected toward chest wall until tension free rotation of pedicle achieved. Inferior pole breast tissue resected as well as in area of planne inset of nipple areola superiorly. Medial and lateral flaps developed. Breast tailor tacked closed.  I then directed attention to right breast where superomedial  pedicle designed. Lumpectomy cavity was over upper inner quadrant down to muscle. The pedicle was deepithelialized and developed to similar thickness. Inferior pole breast tissue excised. TSkin and soft tissue flaps developed until able to be redraped over pedicle without tension. Patient brought to upright sitting position and assessed for symmetry. Patent returned to supine position and breast cavities irrigated and hemostasis obtained. 15 Fr JP placed in each breast and secured with 2-0 nylon. Additional tissue marked by tailor tacking over over lateral chest and breast excised. Over right breast the distal extent of pedicle was rotated superomedially into lumpectomy cavity defect and secured to chest wall and surrounding superficial fascia with 3-0 vicryl. Closure completed with 3-0 vicryl to approximate dermis along inframammary fold and vertical limb. NAC inset with 4-0 vicryl in dermis. Skin closure completed with 4-0 monocryl subcuticular throughout.Tissue adhesive applied.  The patient was allowed to wake from anesthesia, extubated and taken to the recovery room in satisfactory condition.   SPECIMENS: Right and left breast reduction  DRAINS: 15 Fr JP in right and left breast  Irene Limbo, MD [facility] Plastic & Reconstructive Surgery [phone]

## 2016-03-01 NOTE — Transfer of Care (Signed)
Immediate Anesthesia Transfer of Care Note  Patient: Janet King  Procedure(s) Performed: Procedure(s): RADIOACTIVE SEED GUIDED PARTIAL MASTECTOMY WITH AXILLARY SENTINEL LYMPH NODE BIOPSY (Right) RIGHT ONCOPLASTIC BREAST RECONSTRUCTION WITH LEFT REDUCTION FOR SYMMETRY (Bilateral) PORTA CATH INSERTION (Right)  Patient Location: PACU  Anesthesia Type:GA combined with regional for post-op pain  Level of Consciousness: sedated  Airway & Oxygen Therapy: Patient Spontanous Breathing and Patient connected to face mask oxygen  Post-op Assessment: Report given to RN and Post -op Vital signs reviewed and stable  Post vital signs: Reviewed and stable  Last Vitals:  Filed Vitals:   03/01/16 1135 03/01/16 1140  BP: 130/78 131/81  Pulse: 79 81  Temp:    Resp: 17 12    Complications: No apparent anesthesia complications

## 2016-03-01 NOTE — Anesthesia Procedure Notes (Addendum)
Anesthesia Regional Block:  Pectoralis block  Pre-Anesthetic Checklist: ,, timeout performed, Correct Patient, Correct Site, Correct Laterality, Correct Procedure, Correct Position, site marked, Risks and benefits discussed,  Surgical consent,  Pre-op evaluation,  At surgeon's request and post-op pain management  Laterality: Right  Prep: chloraprep       Needles:  Injection technique: Single-shot  Needle Type: Echogenic Needle     Needle Length: 9cm 9 cm Needle Gauge: 21 and 21 G    Additional Needles:  Procedures: ultrasound guided (picture in chart) Pectoralis block Narrative:  Injection made incrementally with aspirations every 5 mL.  Performed by: Personally   Additional Notes: Patient tolerated the procedure well without complications   Procedure Name: LMA Insertion Date/Time: 03/01/2016 11:54 AM Performed by: Maryella Shivers Pre-anesthesia Checklist: Patient identified, Emergency Drugs available, Suction available and Patient being monitored Patient Re-evaluated:Patient Re-evaluated prior to inductionOxygen Delivery Method: Circle System Utilized Preoxygenation: Pre-oxygenation with 100% oxygen Intubation Type: IV induction Ventilation: Mask ventilation without difficulty LMA: LMA inserted LMA Size: 4.0 Number of attempts: 1 Airway Equipment and Method: Bite block Placement Confirmation: positive ETCO2 Tube secured with: Tape Dental Injury: Teeth and Oropharynx as per pre-operative assessment     Procedure Name: Intubation Date/Time: 03/01/2016 1:57 PM Performed by: Maryella Shivers Pre-anesthesia Checklist: Patient identified, Emergency Drugs available, Suction available and Patient being monitored Patient Re-evaluated:Patient Re-evaluated prior to inductionOxygen Delivery Method: Circle System Utilized Preoxygenation: Pre-oxygenation with 100% oxygen Intubation Type: IV induction Ventilation: Mask ventilation without difficulty Laryngoscope Size: Mac  and 3 Grade View: Grade II Tube type: Oral Tube size: 7.0 mm Number of attempts: 1 Airway Equipment and Method: Stylet and Oral airway Placement Confirmation: ETT inserted through vocal cords under direct vision,  positive ETCO2 and breath sounds checked- equal and bilateral Secured at: 20 cm Tube secured with: Tape Dental Injury: Teeth and Oropharynx as per pre-operative assessment

## 2016-03-01 NOTE — Discharge Instructions (Signed)
About my Jackson-Pratt Bulb Drain ° °What is a Jackson-Pratt bulb? °A Jackson-Pratt is a soft, round device used to collect drainage. It is connected to a long, thin drainage catheter, which is held in place by one or two small stiches near your surgical incision site. When the bulb is squeezed, it forms a vacuum, forcing the drainage to empty into the bulb. ° °Emptying the Jackson-Pratt bulb- °To empty the bulb: °1. Release the plug on the top of the bulb. °2. Pour the bulb's contents into a measuring container which your nurse will provide. °3. Record the time emptied and amount of drainage. Empty the drain(s) as often as your     doctor or nurse recommends. ° °Date                  Time                    Amount (Drain 1)                 Amount (Drain 2) ° °_____________________________________________________________________ ° °_____________________________________________________________________ ° °_____________________________________________________________________ ° °_____________________________________________________________________ ° °_____________________________________________________________________ ° °_____________________________________________________________________ ° °_____________________________________________________________________ ° °_____________________________________________________________________ ° °Squeezing the Jackson-Pratt Bulb- °To squeeze the bulb: °1. Make sure the plug at the top of the bulb is open. °2. Squeeze the bulb tightly in your fist. You will hear air squeezing from the bulb. °3. Replace the plug while the bulb is squeezed. °4. Use a safety pin to attach the bulb to your clothing. This will keep the catheter from     pulling at the bulb insertion site. ° °When to call your doctor- °Call your doctor if: °· Drain site becomes red, swollen or hot. °· You have a fever greater than 101 degrees F. °· There is oozing at the drain site. °· Drain falls out (apply a guaze  bandage over the drain hole and secure it with tape). °· Drainage increases daily not related to activity patterns. (You will usually have more drainage when you are active than when you are resting.) °· Drainage has a bad odor. ° ° °Post Anesthesia Home Care Instructions ° °Activity: °Get plenty of rest for the remainder of the day. A responsible adult should stay with you for 24 hours following the procedure.  °For the next 24 hours, DO NOT: °-Drive a car °-Operate machinery °-Drink alcoholic beverages °-Take any medication unless instructed by your physician °-Make any legal decisions or sign important papers. ° °Meals: °Start with liquid foods such as gelatin or soup. Progress to regular foods as tolerated. Avoid greasy, spicy, heavy foods. If nausea and/or vomiting occur, drink only clear liquids until the nausea and/or vomiting subsides. Call your physician if vomiting continues. ° °Special Instructions/Symptoms: °Your throat may feel dry or sore from the anesthesia or the breathing tube placed in your throat during surgery. If this causes discomfort, gargle with warm salt water. The discomfort should disappear within 24 hours. ° °If you had a scopolamine patch placed behind your ear for the management of post- operative nausea and/or vomiting: ° °1. The medication in the patch is effective for 72 hours, after which it should be removed.  Wrap patch in a tissue and discard in the trash. Wash hands thoroughly with soap and water. °2. You may remove the patch earlier than 72 hours if you experience unpleasant side effects which may include dry mouth, dizziness or visual disturbances. °3. Avoid touching the patch. Wash your hands with soap and water after contact with the   patch. °  ° ° °

## 2016-03-01 NOTE — Interval H&P Note (Signed)
History and Physical Interval Note:  03/01/2016 6:51 AM  Janet King  has presented today for surgery, with the diagnosis of right breast cancer  The various methods of treatment have been discussed with the patient and family. After consideration of risks, benefits and other options for treatment, the patient has consented to  Procedure(s): RADIOACTIVE SEED GUIDED PARTIAL MASTECTOMY WITH AXILLARY SENTINEL LYMPH NODE BIOPSY (Right) RIGHT ONCOPLASTIC BREAST RECONSTRUCTION WITH LEFT REDUCTION FOR SYMMETRY (Bilateral) as a surgical intervention .  The patient's history has been reviewed, patient examined, no change in status, stable for surgery.  I have reviewed the patient's chart and labs.  Questions were answered to the patient's satisfaction.     [name]

## 2016-03-01 NOTE — Progress Notes (Signed)
Nuc med staff performed injection, pt tol well (see VS), no additional sedation required. Updated family (in lobby). Pt wishes to go to OR now rather than have family back for quick visit.

## 2016-03-01 NOTE — Progress Notes (Signed)
Assisted Dr. Rose with right, ultrasound guided, pectoralis block. Side rails up, monitors on throughout procedure. See vital signs in flow sheet. Tolerated Procedure well. 

## 2016-03-01 NOTE — Interval H&P Note (Signed)
History and Physical Interval Note:  03/01/2016 11:26 AM  [name]  has presented today for surgery, with the diagnosis of right breast cancer  The various methods of treatment have been discussed with the patient and family. After consideration of risks, benefits and other options for treatment, the patient has consented to  Procedure(s): RADIOACTIVE SEED GUIDED PARTIAL MASTECTOMY WITH AXILLARY SENTINEL LYMPH NODE BIOPSY (Right) RIGHT ONCOPLASTIC BREAST RECONSTRUCTION WITH LEFT REDUCTION FOR SYMMETRY (Bilateral) as a surgical intervention .  The patient's history has been reviewed, patient examined, no change in status, stable for surgery.  I have reviewed the patient's chart and labs.  Questions were answered to the patient's satisfaction.     Azie Mcconahy

## 2016-03-01 NOTE — Op Note (Signed)
Preoperative diagnosis: clinical stage I right breast cancer, her 2 positive Postoperative diagnosis: same as above Procedure: right ij US guided powerport insertion, right breast radioactive seed guided lumpectomy, right axillary sentinel node biopsy Surgeon: Dr Serita Grammes EBL: 50 cc Anes: general  Specimens right breast tissue marked with paint, additional posterior and superior margins marked short superior, long lateral, double deep, right axillary sentinel nodes with highest count 163 Complications none Drains none Sponge count correct Dispo to pacu stable  Indications: This is a 59 yof with newly diagnosed clinical stage I right breast cancer that is her2 positive. She was seen in multidisciplinary clinic and elected to undergo bct. She will also undergo reduction at same time.  We discussed port placement as well.   Procedure: After informed consent was obtained the patient was taken to the operating room. She was given antibiotics. Sequential compression devices were on her legs. She was then placed under general anesthesia with an LMA. Then she was then prepped and draped in the standard sterile surgical fashion. Surgical timeout was then performed.  I used the ultrasound to identify the right internal jugular vein. I then accessed the vein using the ultrasound. This aspirated blood. I then placed the wire.this was confirmed by fluoroscopy to be in the correct position. I then made a pocket below her clavicle on the right side and I tunneled the line between the 2 sites. I then dilated the tract and placed the dilator assembly with the sheath. This was done under fluoroscopy. I then removed the sheath and dilator. The wire was also removed. The line was then pulled back to be in the distal cava. I hooked this up to the port. I sutured this into place with 2-0 Prolene in 2 places. Fluoroscopy then showed this appeared a little deep so I pulled back some more and resutured. This  aspirated blood and flushed easily.This was confirmed with a final fluoroscopy. I then closed this with 2-0 Vicryl and 4-0 Monocryl. Dermabond was placed on both the incisions.. This aspirated blood and I flushed with heparin.  I then made an incision after patient was marked by Dr Iran Planas for her reduction. The seed was located and I made an incision in superio breast.   I then removed the seed with an attempt to get a clear margin.  I did confirm removal of the clip and seed with mammography.I removed some additional posterior and superior tissue to clear this margin as it appeared close.The posterior margin is the muscle. I placed clips in the cavity. I then packed this for Dr Iran Planas. I made an axillary incision.  I went through the axillary fascia. I was able to locate what appeared to be a sentinel node with highest count listed above. There were no more palpable or radioactive nodes present. I obtained hemostasis. I then closed the fascia with 2-0 vicryl The skin was closed with 3-0 vicryl and 4-0 monocryl. I then turned the case over to Dr Iran Planas for completion.

## 2016-03-01 NOTE — Anesthesia Postprocedure Evaluation (Signed)
Anesthesia Post Note  Patient: Janet King  Procedure(s) Performed: Procedure(s) (LRB): RADIOACTIVE SEED GUIDED PARTIAL MASTECTOMY WITH AXILLARY SENTINEL LYMPH NODE BIOPSY (Right) RIGHT ONCOPLASTIC BREAST RECONSTRUCTION WITH LEFT REDUCTION FOR SYMMETRY (Bilateral) PORTA CATH INSERTION (Right)  Patient location during evaluation: PACU Anesthesia Type: General and Regional Level of consciousness: awake and alert Pain management: pain level controlled Vital Signs Assessment: post-procedure vital signs reviewed and stable Respiratory status: spontaneous breathing, nonlabored ventilation, respiratory function stable and patient connected to nasal cannula oxygen Cardiovascular status: blood pressure returned to baseline and stable Postop Assessment: no signs of nausea or vomiting Anesthetic complications: no    Last Vitals:  Filed Vitals:   03/01/16 1624 03/01/16 1630  BP: 149/73 150/90  Pulse: 135 114  Temp:    Resp: 14 18    Last Pain: There were no vitals filed for this visit.               Zenaida Deed

## 2016-03-01 NOTE — H&P (Signed)
Janet King is an 50 y.o. female.   Chief Complaint: breast cancer HPI: 73 yof who has a family history of breast cancer underwent a screening mm that shows density c breasts. she has a right upper inner quadrant mass that is 5 mm spiculated. a targeted US shows a 3x4x5 mm mass in right breast at 1 oclock 7 cm from the nipple. [place] axilla is negative. the biopsy clip is 3.9 cm superior to the expected location of the mass. a right breast stereo core biopsy was done. the pathology is an invasive lobular carcinoma. er and pr positive at 95%. Ki67 is 5%. her 2 is positive. Genetics is negative. she also has mri that shows only hematoma at biopsy site, no other abnormalities. the size of the cancer on core is 7 mm   Past Medical History  Diagnosis Date  . Breast cancer ([facility])     right  . Hypothyroidism   . Baker's cyst of knee   . H/O bone density study     Past Surgical History  Procedure Laterality Date  . Tonsillectomy      Family History  Problem Relation Age of Onset  . Breast cancer Mother 67  . Heart disease Maternal Grandfather   . Colon cancer Paternal Grandmother     dx. 57s  . Cancer Other     maternal great aunt (MGM's sister) dx. with NOS cancer at older age  . Breast cancer Other     maternal great aunt (MGF's sister) dx. in her 77s; s/p mastectomy  . Cancer Other     paternal great aunt (PGM's sister) dx. with NOS cancer at older age   Social History:  reports that she has never smoked. She has never used smokeless tobacco. She reports that she drinks alcohol. She reports that she does not use illicit drugs.  Allergies: No Known Allergies  No prescriptions prior to admission    No results found for this or any previous visit (from the past 48 hour(s)). No results found.  ROS Negative  Height 5' 6.5" (1.689 m), weight 117.028 kg (258 lb), last menstrual period 01/25/2016. Physical Exam  Vitals Weight: 261 lb Height: 66in Body Surface Area:  2.24 m Body Mass Index: 42.13 kg/m  Temp.: 13F(Temporal)  Pulse: 79 (Regular)  BP: 130/80 (Sitting, Left Arm, Standard) Physical Exam  General Mental Status-Alert. Orientation-Oriented X3. Chest and Lung Exam Chest and lung exam reveals -on auscultation, normal breath sounds, no adventitious sounds and normal vocal resonance. Breast Nipples-No Discharge. Breast Lump-No Palpable Breast Mass. Cardiovascular Cardiovascular examination reveals -normal heart sounds, regular rate and rhythm with no murmurs. Lymphatic Head & Neck General Head & Neck Lymphatics: Bilateral - Description - Normal. Axillary General Axillary Region: Bilateral - Description - Normal. Note: no [place] adenopathy  Assessment/Plan Assessment & Plan Rolm Bookbinder MD; 02/05/2016 8:56 AM) BREAST CANCER OF UPPER-INNER QUADRANT OF RIGHT FEMALE BREAST (C50.211) we discussed options again today. we have decided to proceed with port placement (her 2 positive 7 mm tumor), sentinel node biopsy. we discussed all options for breast tumor and we have decided to consider reduction lumpectomy along with sentinel node biopsy  Chavela Justiniano, MD 03/01/2016, 7:14 AM

## 2016-03-02 ENCOUNTER — Encounter (HOSPITAL_BASED_OUTPATIENT_CLINIC_OR_DEPARTMENT_OTHER): Payer: Self-pay | Admitting: General Surgery

## 2016-03-07 ENCOUNTER — Other Ambulatory Visit: Payer: Self-pay | Admitting: General Surgery

## 2016-03-08 ENCOUNTER — Ambulatory Visit (HOSPITAL_BASED_OUTPATIENT_CLINIC_OR_DEPARTMENT_OTHER): Payer: Commercial Managed Care - PPO | Admitting: Hematology and Oncology

## 2016-03-08 ENCOUNTER — Encounter (HOSPITAL_BASED_OUTPATIENT_CLINIC_OR_DEPARTMENT_OTHER): Payer: Self-pay | Admitting: *Deleted

## 2016-03-08 ENCOUNTER — Encounter: Payer: Self-pay | Admitting: Hematology and Oncology

## 2016-03-08 ENCOUNTER — Telehealth: Payer: Self-pay | Admitting: Hematology and Oncology

## 2016-03-08 VITALS — BP 134/81 | HR 123 | Temp 97.9°F | Resp 17 | Wt 252.0 lb

## 2016-03-08 DIAGNOSIS — C50211 Malignant neoplasm of upper-inner quadrant of right female breast: Secondary | ICD-10-CM | POA: Diagnosis not present

## 2016-03-08 NOTE — Assessment & Plan Note (Signed)
Right lumpectomy 03/01/2016: ILC, grade 2, 0.8 cm, ALH, additional superior margin : ILC grade to 0.3 cm , positive superior margin , 0/1 LN neg, mammoplasty bil : benign, ER 95%, PR 95%, HER-2 pos on biopsy and negative on final pathology ratio 1.55, gene copy number is 2.25, Ki-67 5%, T1bN0 stage IA  HER-2: I discussed with the patient in the final HER-2 testing did not show any amplification. We will once again review the biopsy HER-2 and a final HER-2 at the tumor conference. I strongly suspect that the breast cancer is truly HER-2 negative.  Recommendation: 1. Reexcision of the superior margin 2. Oncotype DX testing to determine if chemotherapy would be of any benefit 3. Adjuvant radiation therapy followed by 4. Adjuvant antiestrogen therapy  We may remove the port if Oncotype DX testing shows low risk.

## 2016-03-08 NOTE — Telephone Encounter (Signed)
appt made and avs printed °

## 2016-03-08 NOTE — Progress Notes (Signed)
No care team member to display  SUMMARY OF ONCOLOGIC HISTORY:   Breast cancer of upper-inner quadrant of right female breast ([place])   01/21/2016 Initial Diagnosis Right breast biopsy: Invasive lobular cancer, ER 95%, PR 95%, Ki-67 5%, HER-2 positive ratio 2.52   02/01/2016 Breast MRI Right breast: Post biopsy hematoma measuring 1.9 x 1.3 x 2.5 cm surrounding ring of reactive enhancement, no lymphadenopathy. T2 N0 stage II a clinical stage   03/01/2016 Surgery Right lumpectomy: ILC, grade 2, 0.8 cm, ALH, additional superior margin : ILC grade to 0.3 cm , positive superior margin , 0/1 LN neg, mammoplasty bil : benign, ER 95%, PR 95%, HER-2 pos, Ki-67 5%, T1bN0 stage IA    CHIEF COMPLIANT: Follow-up after right lumpectomy  INTERVAL HISTORY: Janet King is a 50 year old with above-mentioned history of right breast invasive lobular cancer who underwent lumpectomy and is here today to discuss pathology report. She is recovering very well from surgery and does not have any complaints or concerns. She is here today accompanied by her mother.  REVIEW OF SYSTEMS:   Constitutional: Denies fevers, chills or abnormal weight loss Eyes: Denies blurriness of vision Ears, nose, mouth, throat, and face: Denies mucositis or sore throat Respiratory: Denies cough, dyspnea or wheezes Cardiovascular: Denies palpitation, chest discomfort Gastrointestinal:  Denies nausea, heartburn or change in bowel habits Skin: Denies abnormal skin rashes Lymphatics: Denies new lymphadenopathy or easy bruising Neurological:Denies numbness, tingling or new weaknesses Behavioral/Psych: Mood is stable, no new changes  Extremities: No lower extremity edema Breast: Recent breast surgery All other systems were reviewed with the patient and are negative.  I have reviewed the past medical history, past surgical history, social history and family history with the patient and they are unchanged from previous note.  ALLERGIES:   has No Known Allergies.  MEDICATIONS:  Current Outpatient Prescriptions  Medication Sig Dispense Refill  . Omega-3 Fatty Acids (OMEGA 3 PO) Take by mouth.    . oxyCODONE (ROXICODONE) 5 MG immediate release tablet Take 1-2 tablets (5-10 mg total) by mouth every 4 (four) hours as needed for severe pain. 40 tablet 0  . SYNTHROID 112 MCG tablet   0  . Vitamin D, Ergocalciferol, (DRISDOL) 50000 units CAPS capsule Take 50,000 Units by mouth once a week.  0   No current facility-administered medications for this visit.    PHYSICAL EXAMINATION: ECOG PERFORMANCE STATUS: 1 - Symptomatic but completely ambulatory  Filed Vitals:   03/08/16 1147  BP: 134/81  Pulse: 123  Temp: 97.9 F (36.6 C)  Resp: 17   Filed Weights   03/08/16 1147  Weight: 252 lb (114.306 kg)    GENERAL:alert, no distress and comfortable SKIN: skin color, texture, turgor are normal, no rashes or significant lesions EYES: normal, Conjunctiva are pink and non-injected, sclera clear OROPHARYNX:no exudate, no erythema and lips, buccal mucosa, and tongue normal  NECK: supple, thyroid normal size, non-tender, without nodularity LYMPH:  no palpable lymphadenopathy in the cervical, axillary or inguinal LUNGS: clear to auscultation and percussion with normal breathing effort HEART: regular rate & rhythm and no murmurs and no lower extremity edema ABDOMEN:abdomen soft, non-tender and normal bowel sounds MUSCULOSKELETAL:no cyanosis of digits and no clubbing  NEURO: alert & oriented x 3 with fluent speech, no focal motor/sensory deficits EXTREMITIES: No lower extremity edema  LABORATORY DATA:  I have reviewed the data as listed   Chemistry      Component Value Date/Time   NA 141 04/17/2007 0925   K  4.1 04/17/2007 0925   CL 111 04/17/2007 0925   CO2 26 04/17/2007 0925   BUN 12 04/17/2007 0925   CREATININE 0.8 04/17/2007 0925      Component Value Date/Time   CALCIUM 9.1 04/17/2007 0925       No results found for:  WBC, HGB, HCT, MCV, PLT, NEUTROABS   ASSESSMENT & PLAN:  Breast cancer of upper-inner quadrant of right female breast ([place]) Right lumpectomy 03/01/2016: ILC, grade 2, 0.8 cm, ALH, additional superior margin : ILC grade to 0.3 cm , positive superior margin , 0/1 LN neg, mammoplasty bil : benign, ER 95%, PR 95%, HER-2 pos on biopsy and negative on final pathology ratio 1.55, gene copy number is 2.25, Ki-67 5%, T1bN0 stage IA  HER-2: I discussed with the patient in the final HER-2 testing did not show any amplification. We will once again review the biopsy HER-2 and a final HER-2 at the tumor conference. I strongly suspect that the breast cancer is truly HER-2 negative.  Recommendation: 1. Reexcision of the superior margin 2. Oncotype DX testing to determine if chemotherapy would be of any benefit 3. Adjuvant radiation therapy followed by 4. Adjuvant antiestrogen therapy  No orders of the defined types were placed in this encounter.   The patient has a good understanding of the overall plan. she agrees with it. she will call with any problems that may develop before the next visit here.   Rulon Eisenmenger, MD 03/08/2016

## 2016-03-09 ENCOUNTER — Telehealth: Payer: Self-pay | Admitting: Hematology and Oncology

## 2016-03-09 ENCOUNTER — Ambulatory Visit (HOSPITAL_BASED_OUTPATIENT_CLINIC_OR_DEPARTMENT_OTHER): Payer: Commercial Managed Care - PPO | Admitting: Hematology and Oncology

## 2016-03-09 ENCOUNTER — Encounter: Payer: Self-pay | Admitting: Hematology and Oncology

## 2016-03-09 DIAGNOSIS — C50211 Malignant neoplasm of upper-inner quadrant of right female breast: Secondary | ICD-10-CM | POA: Diagnosis not present

## 2016-03-09 NOTE — Telephone Encounter (Signed)
appts made and avs printed °

## 2016-03-09 NOTE — Progress Notes (Signed)
Unable to get in to exam room prior to MD.  No assessment performed.  

## 2016-03-09 NOTE — Progress Notes (Signed)
No care team member to display  SUMMARY OF ONCOLOGIC HISTORY:   Breast cancer of upper-inner quadrant of right female breast ([place])   01/21/2016 Initial Diagnosis Right breast biopsy: Invasive lobular cancer, ER 95%, PR 95%, Ki-67 5%, HER-2 positive ratio 2.52   02/01/2016 Breast MRI Right breast: Post biopsy hematoma measuring 1.9 x 1.3 x 2.5 cm surrounding ring of reactive enhancement, no lymphadenopathy. T2 N0 stage II a clinical stage   03/01/2016 Surgery Right lumpectomy: ILC, grade 2, 0.8 cm, ALH, additional superior margin : ILC grade to 0.3 cm , positive superior margin , 0/1 LN neg, mammoplasty bil : benign, ER 95%, PR 95%, HER-2 pos, Ki-67 5%, T1bN0 stage IA    CHIEF COMPLIANT: Patient is here to follow-up on discussion regarding the HER-2 receptors   INTERVAL HISTORY: CLEMIE GENERAL is a 50 year old with above-mentioned history of right breast cancer underwent lumpectomy and I've seen her yesterday to discuss the pathology report and she is back here today to discuss the report and some additional detail with some information that was missing yesterday. Originally I felt that the HER-2 was negative on the primary tumor you in the biopsy was HER-2 positive. It appears that the HER-2 negative result was off of the second tumor that was resected during the surgery. The primary tumor itself is still HER-2 positive.  REVIEW OF SYSTEMS:   Constitutional: Denies fevers, chills or abnormal weight loss Eyes: Denies blurriness of vision Ears, nose, mouth, throat, and face: Denies mucositis or sore throat Respiratory: Denies cough, dyspnea or wheezes Cardiovascular: Denies palpitation, chest discomfort Gastrointestinal:  Denies nausea, heartburn or change in bowel habits Skin: Denies abnormal skin rashes Lymphatics: Denies new lymphadenopathy or easy bruising Neurological:Denies numbness, tingling or new weaknesses Behavioral/Psych: Mood is stable, no new changes  Extremities: No lower  extremity edema Breast: Healed very well from recent surgery  All other systems were reviewed with the patient and are negative.  I have reviewed the past medical history, past surgical history, social history and family history with the patient and they are unchanged from previous note.  ALLERGIES:  has No Known Allergies.  MEDICATIONS:  Current Outpatient Prescriptions  Medication Sig Dispense Refill  . Omega-3 Fatty Acids (OMEGA 3 PO) Take by mouth.    . oxyCODONE (ROXICODONE) 5 MG immediate release tablet Take 1-2 tablets (5-10 mg total) by mouth every 4 (four) hours as needed for severe pain. 40 tablet 0  . SYNTHROID 112 MCG tablet   0  . Vitamin D, Ergocalciferol, (DRISDOL) 50000 units CAPS capsule Take 50,000 Units by mouth once a week.  0   No current facility-administered medications for this visit.    PHYSICAL EXAMINATION: ECOG PERFORMANCE STATUS: 0 - Asymptomatic  Filed Vitals:   03/09/16 1304  BP: 127/91  Pulse: 111  Temp: 98.3 F (36.8 C)  Resp: 18   Filed Weights   03/09/16 1304  Weight: 252 lb 3.2 oz (114.397 kg)    GENERAL:alert, no distress and comfortable SKIN: skin color, texture, turgor are normal, no rashes or significant lesions EYES: normal, Conjunctiva are pink and non-injected, sclera clear OROPHARYNX:no exudate, no erythema and lips, buccal mucosa, and tongue normal  NECK: supple, thyroid normal size, non-tender, without nodularity LYMPH:  no palpable lymphadenopathy in the cervical, axillary or inguinal LUNGS: clear to auscultation and percussion with normal breathing effort HEART: regular rate & rhythm and no murmurs and no lower extremity edema ABDOMEN:abdomen soft, non-tender and normal bowel sounds MUSCULOSKELETAL:no cyanosis of  digits and no clubbing  NEURO: alert & oriented x 3 with fluent speech, no focal motor/sensory deficits EXTREMITIES: No lower extremity edema  LABORATORY DATA:  I have reviewed the data as listed   Chemistry       Component Value Date/Time   NA 141 04/17/2007 0925   K 4.1 04/17/2007 0925   CL 111 04/17/2007 0925   CO2 26 04/17/2007 0925   BUN 12 04/17/2007 0925   CREATININE 0.8 04/17/2007 0925      Component Value Date/Time   CALCIUM 9.1 04/17/2007 0925     No results found for: WBC, HGB, HCT, MCV, PLT, NEUTROABS   ASSESSMENT & PLAN:  Breast cancer of upper-inner quadrant of right female breast ([place]) Right lumpectomy 03/01/2016: ILC, grade 2, 0.8 cm, ALH, additional superior margin : ILC grade to 0.3 cm , positive superior margin , 0/1 LN neg, mammoplasty bil : benign, ER 95%, PR 95%, HER-2 positive on primary tumor and negative on secondary mass which had a ratio of 1.55, gene copy number is 2.25, Ki-67 5%, T1bN0 stage IA  HER-2: I discussed with the patient that the HER-2 was positive in the original tumor and the HER-2 on the second excision was negative. I was able to get this clarification from pathology. So the treatment plan is once again changed.  Recommendation: 1. Reexcision of superior margin 2. Adjuvant therapy with Taxol and Herceptin weekly 12 followed by Herceptin maintenance every 3 weeks 1 year 3. Follow-up adjuvant radiation followed by 4. Antiestrogen therapy  Chemotherapy counseling: I discussed with some benefits of chemotherapy including the risk of hair loss, nausea, decrease in blood counts, allergy reaction to Taxol, risk of infection, the risk of neuropathy, and the risk of cardiac dysfunction related Herceptin. Patient understands his risks and is willing to proceed.  We will need an echocardiogram, chemotherapy class prior to starting treatment. I plan to start chemotherapy on May 17. This would be 2 weeks after her surgery. Return to clinic on May 17 for follow-up.   Orders Placed This Encounter  Procedures  . ECHOCARDIOGRAM COMPLETE    Standing Status: Future     Number of Occurrences:      Standing Expiration Date: 06/08/2017    Order Specific  Question:  Where should this test be performed    Answer:  [facility]    Order Specific Question:  Complete or Limited study?    Answer:  Complete    Order Specific Question:  With Image Enhancing Agent or without Image Enhancing Agent?    Answer:  With Image Enhancing Agent    Order Specific Question:  Reason for exam-Echo    Answer:  Chemo  V67.2 / Z09   The patient has a good understanding of the overall plan. she agrees with it. she will call with any problems that may develop before the next visit here.   Rulon Eisenmenger, MD 03/09/2016

## 2016-03-09 NOTE — Assessment & Plan Note (Signed)
Right lumpectomy 03/01/2016: ILC, grade 2, 0.8 cm, ALH, additional superior margin : ILC grade to 0.3 cm , positive superior margin , 0/1 LN neg, mammoplasty bil : benign, ER 95%, PR 95%, HER-2 positive on primary tumor and negative on secondary mass which had a ratio of 1.55, gene copy number is 2.25, Ki-67 5%, T1bN0 stage IA  HER-2: I discussed with the patient that the HER-2 was positive in the original tumor and the HER-2 on the second excision was negative. I was able to get this clarification from pathology. So the treatment plan is once again changed.  Recommendation: 1. Reexcision of superior margin 2. Adjuvant therapy with Taxol and Herceptin weekly 12 followed by Herceptin maintenance every 3 weeks 1 year 3. Follow-up adjuvant radiation followed by 4. Antiestrogen therapy  We will need an echocardiogram, chemotherapy class prior to starting treatment. I plan to start chemotherapy on May 17. This would be 2 weeks after her surgery. Return to clinic on May 17 for follow-up.

## 2016-03-09 NOTE — Telephone Encounter (Signed)
appt made and avs printed °

## 2016-03-10 ENCOUNTER — Other Ambulatory Visit: Payer: Self-pay

## 2016-03-10 DIAGNOSIS — C50211 Malignant neoplasm of upper-inner quadrant of right female breast: Secondary | ICD-10-CM

## 2016-03-10 NOTE — Progress Notes (Signed)
Echo appt cancelled with CV and order cancelled.  Echo order entered to be performed at [facility].  Inbasket sent to [facility] requesting referral MD and notifying her of the echo order.   Pt notified 4/28 appt cancelled and we will call her with new Echo appt.  Pt voiced understanding.

## 2016-03-11 ENCOUNTER — Other Ambulatory Visit: Payer: Commercial Managed Care - PPO

## 2016-03-11 ENCOUNTER — Encounter: Payer: Self-pay | Admitting: *Deleted

## 2016-03-11 ENCOUNTER — Other Ambulatory Visit (HOSPITAL_COMMUNITY): Payer: Commercial Managed Care - PPO

## 2016-03-15 ENCOUNTER — Encounter (HOSPITAL_BASED_OUTPATIENT_CLINIC_OR_DEPARTMENT_OTHER): Payer: Self-pay | Admitting: *Deleted

## 2016-03-15 ENCOUNTER — Ambulatory Visit (HOSPITAL_BASED_OUTPATIENT_CLINIC_OR_DEPARTMENT_OTHER): Payer: Commercial Managed Care - PPO | Admitting: Anesthesiology

## 2016-03-15 ENCOUNTER — Ambulatory Visit (HOSPITAL_BASED_OUTPATIENT_CLINIC_OR_DEPARTMENT_OTHER)
Admission: RE | Admit: 2016-03-15 | Discharge: 2016-03-15 | Disposition: A | Discharge status: Home / Self Care | Payer: Commercial Managed Care - PPO | Source: Ambulatory Visit | Attending: General Surgery | Admitting: General Surgery

## 2016-03-15 ENCOUNTER — Encounter (HOSPITAL_BASED_OUTPATIENT_CLINIC_OR_DEPARTMENT_OTHER)
Admission: RE | Disposition: A | Discharge status: Home / Self Care | Payer: Self-pay | Source: Ambulatory Visit | Attending: General Surgery

## 2016-03-15 DIAGNOSIS — Z170 Estrogen receptor positive status [ER+]: Secondary | ICD-10-CM | POA: Diagnosis not present

## 2016-03-15 DIAGNOSIS — Z803 Family history of malignant neoplasm of breast: Secondary | ICD-10-CM | POA: Insufficient documentation

## 2016-03-15 DIAGNOSIS — C50911 Malignant neoplasm of unspecified site of right female breast: Secondary | ICD-10-CM | POA: Diagnosis present

## 2016-03-15 DIAGNOSIS — C50211 Malignant neoplasm of upper-inner quadrant of right female breast: Secondary | ICD-10-CM | POA: Insufficient documentation

## 2016-03-15 DIAGNOSIS — Z6841 Body Mass Index (BMI) 40.0 and over, adult: Secondary | ICD-10-CM | POA: Insufficient documentation

## 2016-03-15 DIAGNOSIS — E039 Hypothyroidism, unspecified: Secondary | ICD-10-CM | POA: Diagnosis not present

## 2016-03-15 HISTORY — PX: RE-EXCISION OF BREAST CANCER,SUPERIOR MARGINS: SHX6047

## 2016-03-15 SURGERY — RE-EXCISION OF BREAST CANCER,SUPERIOR MARGINS
Anesthesia: General | Site: Breast | Laterality: Right

## 2016-03-15 MED ORDER — DEXAMETHASONE SODIUM PHOSPHATE 4 MG/ML IJ SOLN
INTRAMUSCULAR | Status: DC | PRN
  Administered 2016-03-15: 10 mg via INTRAVENOUS

## 2016-03-15 MED ORDER — BUPIVACAINE HCL (PF) 0.25 % IJ SOLN
INTRAMUSCULAR | Status: DC | PRN
  Administered 2016-03-15: 10 mL

## 2016-03-15 MED ORDER — PROPOFOL 10 MG/ML IV BOLUS
INTRAVENOUS | Status: AC
  Filled 2016-03-15: qty 20

## 2016-03-15 MED ORDER — ONDANSETRON HCL 4 MG/2ML IJ SOLN: 4.0000 mg | Freq: Once | INTRAMUSCULAR | Status: DC | PRN

## 2016-03-15 MED ORDER — DEXAMETHASONE SODIUM PHOSPHATE 10 MG/ML IJ SOLN
INTRAMUSCULAR | Status: AC
  Filled 2016-03-15: qty 1

## 2016-03-15 MED ORDER — BUPIVACAINE HCL (PF) 0.25 % IJ SOLN
INTRAMUSCULAR | Status: AC
  Filled 2016-03-15: qty 150

## 2016-03-15 MED ORDER — OXYCODONE HCL 5 MG/5ML PO SOLN: 5.0000 mg | Freq: Once | ORAL | Status: DC | PRN

## 2016-03-15 MED ORDER — METHYLENE BLUE 0.5 % INJ SOLN
INTRAVENOUS | Status: AC
  Filled 2016-03-15: qty 10

## 2016-03-15 MED ORDER — FENTANYL CITRATE (PF) 100 MCG/2ML IJ SOLN
INTRAMUSCULAR | Status: AC
  Filled 2016-03-15: qty 2

## 2016-03-15 MED ORDER — FENTANYL CITRATE (PF) 100 MCG/2ML IJ SOLN
50.0000 ug | INTRAMUSCULAR | Status: AC | PRN
  Administered 2016-03-15 (×4): 50 ug via INTRAVENOUS

## 2016-03-15 MED ORDER — LACTATED RINGERS IV SOLN
INTRAVENOUS | Status: DC
  Administered 2016-03-15: 10:00:00 via INTRAVENOUS

## 2016-03-15 MED ORDER — BUPIVACAINE HCL (PF) 0.25 % IJ SOLN
INTRAMUSCULAR | Status: AC
  Filled 2016-03-15: qty 30

## 2016-03-15 MED ORDER — ONDANSETRON HCL 4 MG/2ML IJ SOLN
INTRAMUSCULAR | Status: DC | PRN
  Administered 2016-03-15: 4 mg via INTRAVENOUS

## 2016-03-15 MED ORDER — LIDOCAINE 2% (20 MG/ML) 5 ML SYRINGE
INTRAMUSCULAR | Status: AC
  Filled 2016-03-15: qty 5

## 2016-03-15 MED ORDER — CEFAZOLIN SODIUM-DEXTROSE 2-4 GM/100ML-% IV SOLN
INTRAVENOUS | Status: AC
  Filled 2016-03-15: qty 100

## 2016-03-15 MED ORDER — MIDAZOLAM HCL 2 MG/2ML IJ SOLN
INTRAMUSCULAR | Status: AC
  Filled 2016-03-15: qty 2

## 2016-03-15 MED ORDER — GLYCOPYRROLATE 0.2 MG/ML IJ SOLN: 0.2000 mg | Freq: Once | INTRAMUSCULAR | Status: DC | PRN

## 2016-03-15 MED ORDER — PROPOFOL 10 MG/ML IV BOLUS
INTRAVENOUS | Status: DC | PRN
  Administered 2016-03-15: 50 mg via INTRAVENOUS
  Administered 2016-03-15: 200 mg via INTRAVENOUS

## 2016-03-15 MED ORDER — KETOROLAC TROMETHAMINE 30 MG/ML IJ SOLN
INTRAMUSCULAR | Status: DC | PRN
  Administered 2016-03-15: 30 mg via INTRAVENOUS

## 2016-03-15 MED ORDER — KETOROLAC TROMETHAMINE 30 MG/ML IJ SOLN
INTRAMUSCULAR | Status: AC
  Filled 2016-03-15: qty 1

## 2016-03-15 MED ORDER — CEFAZOLIN SODIUM-DEXTROSE 2-4 GM/100ML-% IV SOLN: 2.0000 g | INTRAVENOUS | Status: DC

## 2016-03-15 MED ORDER — ONDANSETRON HCL 4 MG/2ML IJ SOLN
INTRAMUSCULAR | Status: AC
  Filled 2016-03-15: qty 2

## 2016-03-15 MED ORDER — SCOPOLAMINE 1 MG/3DAYS TD PT72: 1.0000 | MEDICATED_PATCH | Freq: Once | TRANSDERMAL | Status: DC | PRN

## 2016-03-15 MED ORDER — OXYCODONE HCL 5 MG PO TABS: 5.0000 mg | ORAL_TABLET | Freq: Once | ORAL | Status: DC | PRN

## 2016-03-15 MED ORDER — LIDOCAINE HCL (CARDIAC) 20 MG/ML IV SOLN
INTRAVENOUS | Status: DC | PRN
  Administered 2016-03-15: 80 mg via INTRAVENOUS

## 2016-03-15 MED ORDER — SODIUM CHLORIDE 0.9 % IJ SOLN
INTRAMUSCULAR | Status: AC
  Filled 2016-03-15: qty 10

## 2016-03-15 MED ORDER — MIDAZOLAM HCL 2 MG/2ML IJ SOLN
1.0000 mg | INTRAMUSCULAR | Status: DC | PRN
  Administered 2016-03-15: 2 mg via INTRAVENOUS

## 2016-03-15 MED ORDER — FENTANYL CITRATE (PF) 100 MCG/2ML IJ SOLN: 25.0000 ug | INTRAMUSCULAR | Status: DC | PRN

## 2016-03-15 SURGICAL SUPPLY — 67 items
APPLIER CLIP 9.375 MED OPEN (MISCELLANEOUS) ×3
APR CLP MED 9.3 20 MLT OPN (MISCELLANEOUS) ×1
BINDER BREAST LRG (GAUZE/BANDAGES/DRESSINGS) IMPLANT
BINDER BREAST MEDIUM (GAUZE/BANDAGES/DRESSINGS) IMPLANT
BINDER BREAST XLRG (GAUZE/BANDAGES/DRESSINGS) IMPLANT
BINDER BREAST XXLRG (GAUZE/BANDAGES/DRESSINGS) ×2 IMPLANT
BLADE SURG 15 STRL LF DISP TIS (BLADE) ×1 IMPLANT
BLADE SURG 15 STRL SS (BLADE) ×3
CANISTER SUCT 1200ML W/VALVE (MISCELLANEOUS) ×3 IMPLANT
CHLORAPREP W/TINT 26ML (MISCELLANEOUS) ×3 IMPLANT
CLIP APPLIE 9.375 MED OPEN (MISCELLANEOUS) IMPLANT
CLOSURE WOUND 1/2 X4 (GAUZE/BANDAGES/DRESSINGS) ×1
COVER BACK TABLE 60X90IN (DRAPES) ×3 IMPLANT
COVER MAYO STAND STRL (DRAPES) ×3 IMPLANT
DECANTER SPIKE VIAL GLASS SM (MISCELLANEOUS) IMPLANT
DEVICE DUBIN W/COMP PLATE 8390 (MISCELLANEOUS) IMPLANT
DRAPE LAPAROSCOPIC ABDOMINAL (DRAPES) ×3 IMPLANT
DRAPE UTILITY XL STRL (DRAPES) ×2 IMPLANT
DRSG PAD ABDOMINAL 8X10 ST (GAUZE/BANDAGES/DRESSINGS) ×4 IMPLANT
DRSG TEGADERM 4X4.75 (GAUZE/BANDAGES/DRESSINGS) ×1 IMPLANT
ELECT BLADE 4.0 EZ CLEAN MEGAD (MISCELLANEOUS) ×3
ELECT COATED BLADE 2.86 ST (ELECTRODE) ×3 IMPLANT
ELECT REM PT RETURN 9FT ADLT (ELECTROSURGICAL) ×3
ELECTRODE BLDE 4.0 EZ CLN MEGD (MISCELLANEOUS) IMPLANT
ELECTRODE REM PT RTRN 9FT ADLT (ELECTROSURGICAL) ×1 IMPLANT
GLOVE BIO SURGEON STRL SZ 6 (GLOVE) ×2 IMPLANT
GLOVE BIO SURGEON STRL SZ7 (GLOVE) ×3 IMPLANT
GLOVE BIOGEL PI IND STRL 7.0 (GLOVE) IMPLANT
GLOVE BIOGEL PI IND STRL 7.5 (GLOVE) ×1 IMPLANT
GLOVE BIOGEL PI INDICATOR 7.0 (GLOVE) ×2
GLOVE BIOGEL PI INDICATOR 7.5 (GLOVE) ×2
GLOVE ECLIPSE 6.5 STRL STRAW (GLOVE) ×2 IMPLANT
GLOVE EXAM NITRILE EXT CUFF MD (GLOVE) ×2 IMPLANT
GOWN STRL REUS W/ TWL LRG LVL3 (GOWN DISPOSABLE) ×3 IMPLANT
GOWN STRL REUS W/TWL LRG LVL3 (GOWN DISPOSABLE) ×9
ILLUMINATOR WAVEGUIDE N/F (MISCELLANEOUS) ×2 IMPLANT
LIGHT WAVEGUIDE WIDE FLAT (MISCELLANEOUS) IMPLANT
LIQUID BAND (GAUZE/BANDAGES/DRESSINGS) IMPLANT
NDL HYPO 25X1 1.5 SAFETY (NEEDLE) ×1 IMPLANT
NEEDLE HYPO 25X1 1.5 SAFETY (NEEDLE) ×3 IMPLANT
NS IRRIG 1000ML POUR BTL (IV SOLUTION) ×2 IMPLANT
PACK BASIN DAY SURGERY FS (CUSTOM PROCEDURE TRAY) ×3 IMPLANT
PENCIL BUTTON HOLSTER BLD 10FT (ELECTRODE) ×3 IMPLANT
SLEEVE SCD COMPRESS KNEE MED (MISCELLANEOUS) ×3 IMPLANT
SPONGE GAUZE 4X4 12PLY STER LF (GAUZE/BANDAGES/DRESSINGS) ×1 IMPLANT
SPONGE LAP 18X18 X RAY DECT (DISPOSABLE) ×6 IMPLANT
SPONGE LAP 4X18 X RAY DECT (DISPOSABLE) ×1 IMPLANT
STRIP CLOSURE SKIN 1/2X4 (GAUZE/BANDAGES/DRESSINGS) ×1 IMPLANT
SUT ETHILON 4 0 PS 2 18 (SUTURE) ×6 IMPLANT
SUT MNCRL AB 3-0 PS2 18 (SUTURE) IMPLANT
SUT MNCRL AB 4-0 PS2 18 (SUTURE) IMPLANT
SUT MON AB 5-0 PS2 18 (SUTURE) ×1 IMPLANT
SUT SILK 2 0 SH (SUTURE) ×3 IMPLANT
SUT VIC AB 2-0 SH 27 (SUTURE) ×3
SUT VIC AB 2-0 SH 27XBRD (SUTURE) ×1 IMPLANT
SUT VIC AB 3-0 SH 27 (SUTURE) ×3
SUT VIC AB 3-0 SH 27X BRD (SUTURE) ×1 IMPLANT
SUT VIC AB 5-0 PS2 18 (SUTURE) IMPLANT
SUT VICRYL 4-0 PS2 18IN ABS (SUTURE) ×2 IMPLANT
SUT VICRYL AB 3 0 TIES (SUTURE) IMPLANT
SYR BULB IRRIGATION 50ML (SYRINGE) ×2 IMPLANT
SYR CONTROL 10ML LL (SYRINGE) ×3 IMPLANT
TOWEL OR 17X24 6PK STRL BLUE (TOWEL DISPOSABLE) ×3 IMPLANT
TOWEL OR NON WOVEN STRL DISP B (DISPOSABLE) ×1 IMPLANT
TUBE CONNECTING 20'X1/4 (TUBING) ×1
TUBE CONNECTING 20X1/4 (TUBING) ×1 IMPLANT
YANKAUER SUCT BULB TIP NO VENT (SUCTIONS) ×2 IMPLANT

## 2016-03-15 NOTE — Transfer of Care (Signed)
Immediate Anesthesia Transfer of Care Note  Patient: Janet King  Procedure(s) Performed: Procedure(s): RE-EXCISION OF BREAST CANCER,SUPERIOR MARGINS (Right)  Patient Location: PACU  Anesthesia Type:General  Level of Consciousness: awake and oriented  Airway & Oxygen Therapy: Patient Spontanous Breathing and Patient connected to face mask oxygen  Post-op Assessment: Report given to RN  Post vital signs: Reviewed and stable  Last Vitals: 114/71, 106, 16, 100% t 98.8  Filed Vitals:   03/15/16 0959  BP: 141/87  Pulse: 109  Temp: 36.7 C  Resp: 18    Last Pain:  Filed Vitals:   03/15/16 1002  PainSc: 0-No pain      Patients Stated Pain Goal: 0 ([id number])  Complications: No apparent anesthesia complications

## 2016-03-15 NOTE — Interval H&P Note (Signed)
History and Physical Interval Note:  03/15/2016 11:28 AM  [name]  has presented today for surgery, with the diagnosis of RIGHT BREAST CANCER  The various methods of treatment have been discussed with the patient and family. After consideration of risks, benefits and other options for treatment, the patient has consented to  Procedure(s): RE-EXCISION OF BREAST CANCER,SUPERIOR MARGINS (Right) as a surgical intervention .  The patient's history has been reviewed, patient examined, no change in status, stable for surgery.  I have reviewed the patient's chart and labs.  Questions were answered to the patient's satisfaction.     Janet King

## 2016-03-15 NOTE — Anesthesia Preprocedure Evaluation (Addendum)
Anesthesia Evaluation  Patient identified by MRN, date of birth, ID band Patient awake    Reviewed: Allergy & Precautions, NPO status , Patient's Chart, lab work & pertinent test results  Airway Mallampati: II  TM Distance: <3 FB Neck ROM: Full    Dental no notable dental hx.    Pulmonary neg pulmonary ROS,    Pulmonary exam normal breath sounds clear to auscultation       Cardiovascular negative cardio ROS Normal cardiovascular exam Rhythm:Regular Rate:Normal     Neuro/Psych negative neurological ROS  negative psych ROS   GI/Hepatic negative GI ROS, Neg liver ROS,   Endo/Other  Hypothyroidism Morbid obesity  Renal/GU negative Renal ROS  negative genitourinary   Musculoskeletal negative musculoskeletal ROS (+)   Abdominal   Peds negative pediatric ROS (+)  Hematology negative hematology ROS (+)   Anesthesia Other Findings   Reproductive/Obstetrics negative OB ROS                             Anesthesia Physical  Anesthesia Plan  ASA: III  Anesthesia Plan: General   Post-op Pain Management:    Induction: Intravenous  Airway Management Planned: Oral ETT  Additional Equipment:   Intra-op Plan:   Post-operative Plan: Extubation in OR  Informed Consent: I have reviewed the patients History and Physical, chart, labs and discussed the procedure including the risks, benefits and alternatives for the proposed anesthesia with the patient or authorized representative who has indicated his/her understanding and acceptance.   Dental advisory given  Plan Discussed with: CRNA, Surgeon and Anesthesiologist  Anesthesia Plan Comments:        Anesthesia Quick Evaluation

## 2016-03-15 NOTE — Discharge Instructions (Signed)
[facility] °Office Phone Number [phone] ° ° POST OP INSTRUCTIONS ° °Always review your discharge instruction sheet given to you by the facility where your surgery was performed. ° °IF YOU HAVE DISABILITY OR FAMILY LEAVE FORMS, YOU MUST BRING THEM TO THE OFFICE FOR PROCESSING.  DO NOT GIVE THEM TO YOUR DOCTOR. ° °1. A prescription for pain medication may be given to you upon discharge.  Take your pain medication as prescribed, if needed.  If narcotic pain medicine is not needed, then you may take acetaminophen (Tylenol), naprosyn (Alleve) or ibuprofen (Advil) as needed. °2. Take your usually prescribed medications unless otherwise directed °3. If you need a refill on your pain medication, please contact your pharmacy.  They will contact our office to request authorization.  Prescriptions will not be filled after 5pm or on week-ends. °4. You should eat very light the first 24 hours after surgery, such as soup, crackers, pudding, etc.  Resume your normal diet the day after surgery. °5. Most patients will experience some swelling and bruising in the breast.  Ice packs and a good support bra will help.  Wear the breast binder provided or a sports bra for 72 hours day and night.  After that wear a sports bra during the day until you return to the office. Swelling and bruising can take several days to resolve.  °6. It is common to experience some constipation if taking pain medication after surgery.  Increasing fluid intake and taking a stool softener will usually help or prevent this problem from occurring.  A mild laxative (Milk of Magnesia or Miralax) should be taken according to package directions if there are no bowel movements after 48 hours. °7. Unless discharge instructions indicate otherwise, you may remove your bandages 48 hours after surgery and you may shower at that time.  You may have steri-strips (small skin tapes) in place directly over the incision.  These strips should be left on the  skin for 7-10 days and will come off on their own.  If your surgeon used skin glue on the incision, you may shower in 24 hours.  The glue will flake off over the next 2-3 weeks.  Any sutures or staples will be removed at the office during your follow-up visit. °8. ACTIVITIES:  You may resume regular daily activities (gradually increasing) beginning the next day.  Wearing a good support bra or sports bra minimizes pain and swelling.  You may have sexual intercourse when it is comfortable. °a. You may drive when you no longer are taking prescription pain medication, you can comfortably wear a seatbelt, and you can safely maneuver your car and apply brakes. °b. RETURN TO WORK:  ______________________________________________________________________________________ °9. You should see your doctor in the office for a follow-up appointment approximately two weeks after your surgery.  Your doctor’s nurse will typically make your follow-up appointment when she calls you with your pathology report.  Expect your pathology report 3-4 business days after your surgery.  You may call to check if you do not hear from us after three days. °10. OTHER INSTRUCTIONS: _______________________________________________________________________________________________ _____________________________________________________________________________________________________________________________________ °_____________________________________________________________________________________________________________________________________ °_____________________________________________________________________________________________________________________________________ ° °WHEN TO CALL DR WAKEFIELD: °1. Fever over 101.0 °2. Nausea and/or vomiting. °3. Extreme swelling or bruising. °4. Continued bleeding from incision. °5. Increased pain, redness, or drainage from the incision. ° °The clinic staff is available to answer your questions during regular  business hours.  Please don’t hesitate to call and ask to speak to one of the nurses for clinical concerns.    If you have a medical emergency, go to the nearest emergency room or call 911.  A surgeon from [facility] is always on call at the hospital. ° °For further questions, please visit [email or web address]  ° ° °Post Anesthesia Home Care Instructions ° °Activity: °Get plenty of rest for the remainder of the day. A responsible adult should stay with you for 24 hours following the procedure.  °For the next 24 hours, DO NOT: °-Drive a car °-Operate machinery °-Drink alcoholic beverages °-Take any medication unless instructed by your physician °-Make any legal decisions or sign important papers. ° °Meals: °Start with liquid foods such as gelatin or soup. Progress to regular foods as tolerated. Avoid greasy, spicy, heavy foods. If nausea and/or vomiting occur, drink only clear liquids until the nausea and/or vomiting subsides. Call your physician if vomiting continues. ° °Special Instructions/Symptoms: °Your throat may feel dry or sore from the anesthesia or the breathing tube placed in your throat during surgery. If this causes discomfort, gargle with warm salt water. The discomfort should disappear within 24 hours. ° °If you had a scopolamine patch placed behind your ear for the management of post- operative nausea and/or vomiting: ° °1. The medication in the patch is effective for 72 hours, after which it should be removed.  Wrap patch in a tissue and discard in the trash. Wash hands thoroughly with soap and water. °2. You may remove the patch earlier than 72 hours if you experience unpleasant side effects which may include dry mouth, dizziness or visual disturbances. °3. Avoid touching the patch. Wash your hands with soap and water after contact with the patch. °  °Call your surgeon if you experience:  ° °1.  Fever over 101.0. °2.  Inability to urinate. °3.  Nausea and/or vomiting. °4.   Extreme swelling or bruising at the surgical site. °5.  Continued bleeding from the incision. °6.  Increased pain, redness or drainage from the incision. °7.  Problems related to your pain medication. °8.  Any problems and/or concerns ° °

## 2016-03-15 NOTE — Anesthesia Postprocedure Evaluation (Signed)
Anesthesia Post Note  Patient: Janet King  Procedure(s) Performed: Procedure(s) (LRB): RE-EXCISION OF BREAST CANCER, SUPERIOR MARGINS (Right)  Patient location during evaluation: PACU Anesthesia Type: General Level of consciousness: awake and alert and oriented Pain management: pain level controlled Vital Signs Assessment: post-procedure vital signs reviewed and stable Respiratory status: spontaneous breathing, nonlabored ventilation and respiratory function stable Cardiovascular status: blood pressure returned to baseline and stable Postop Assessment: no signs of nausea or vomiting Anesthetic complications: no    Last Vitals:  Filed Vitals:   03/15/16 1307 03/15/16 1315  BP:  122/84  Pulse:  95  Temp: 37.1 C   Resp:  14    Last Pain:  Filed Vitals:   03/15/16 1323  PainSc: 0-No pain                 Puja Caffey A.

## 2016-03-15 NOTE — H&P (View-Only) (Signed)
Janet King is an 50 y.o. female.   Chief Complaint: breast cancer HPI: 37 yof who has a family history of breast cancer underwent a screening mm that shows density c breasts. she has a right upper inner quadrant mass that is 5 mm spiculated. a targeted US shows a 3x4x5 mm mass in right breast at 1 oclock 7 cm from the nipple. [place] axilla is negative. the biopsy clip is 3.9 cm superior to the expected location of the mass. a right breast stereo core biopsy was done. the pathology is an invasive lobular carcinoma. er and pr positive at 95%. Ki67 is 5%. her 2 is positive. Genetics is negative. she also has mri that shows only hematoma at biopsy site, no other abnormalities. the size of the cancer on core is 7 mm   Past Medical History  Diagnosis Date  . Breast cancer ([place])     right  . Hypothyroidism   . Baker's cyst of knee   . H/O bone density study     Past Surgical History  Procedure Laterality Date  . Tonsillectomy      Family History  Problem Relation Age of Onset  . Breast cancer Mother 17  . Heart disease Maternal Grandfather   . Colon cancer Paternal Grandmother     dx. 26s  . Cancer Other     maternal great aunt (MGM's sister) dx. with NOS cancer at older age  . Breast cancer Other     maternal great aunt (MGF's sister) dx. in her 46s; s/p mastectomy  . Cancer Other     paternal great aunt (PGM's sister) dx. with NOS cancer at older age   Social History:  reports that she has never smoked. She has never used smokeless tobacco. She reports that she drinks alcohol. She reports that she does not use illicit drugs.  Allergies: No Known Allergies  No prescriptions prior to admission    No results found for this or any previous visit (from the past 48 hour(s)). No results found.  ROS Negative  Height 5' 6.5" (1.689 m), weight 117.028 kg (258 lb), last menstrual period 01/25/2016. Physical Exam  Vitals Weight: 261 lb Height: 66in Body Surface Area:  2.24 m Body Mass Index: 42.13 kg/m  Temp.: 58F(Temporal)  Pulse: 79 (Regular)  BP: 130/80 (Sitting, Left Arm, Standard) Physical Exam  General Mental Status-Alert. Orientation-Oriented X3. Chest and Lung Exam Chest and lung exam reveals -on auscultation, normal breath sounds, no adventitious sounds and normal vocal resonance. Breast Nipples-No Discharge. Breast Lump-No Palpable Breast Mass. Cardiovascular Cardiovascular examination reveals -normal heart sounds, regular rate and rhythm with no murmurs. Lymphatic Head & Neck General Head & Neck Lymphatics: Bilateral - Description - Normal. Axillary General Axillary Region: Bilateral - Description - Normal. Note: no [place] adenopathy  Assessment/Plan Assessment & Plan Rolm Bookbinder MD; 02/05/2016 8:56 AM) BREAST CANCER OF UPPER-INNER QUADRANT OF RIGHT FEMALE BREAST (C50.211) we discussed options again today. we have decided to proceed with port placement (her 2 positive 7 mm tumor), sentinel node biopsy. we discussed all options for breast tumor and we have decided to consider reduction lumpectomy along with sentinel node biopsy  Shelina Luo, MD 03/01/2016, 7:14 AM

## 2016-03-15 NOTE — Op Note (Signed)
Preoperative diagnosis: clinical stage I right breast cancer, her 2 positive, superior margin positive on initial excision Postoperative diagnosis: same as above Procedure: re-excision right superior lumpectomy margin Surgeon: Dr Serita Grammes Asst: Dr Irene Limbo EBL: minimal Anes: general  Specimens right breast superior margin marked short superior long lateral double deep Complications none Drains none Sponge count correct Dispo to pacu stable  Indications: This is a 58 yof with newly diagnosed clinical stage I right breast cancer that is her2 positive. She was seen in multidisciplinary clinic and elected to undergo bct. She underwent reduction lumpectomy and has small satellite tumor at superior margin that is positive. We discussed options and have elected to excise the superior margin.  Will do this in conjunction with Dr Iran Planas due to reduction.   Procedure: After informed consent was obtained the patient was taken to the operating room. She was given antibiotics. Sequential compression devices were on her legs. She was then placed under general anesthesia with an LMA. Then she was then prepped and draped in the standard sterile surgical fashion. Surgical timeout was then performed.  Dr Iran Planas reentered old incision and opened tissue to identify prior superior margin. My clip was identifiable and I excised the entire superior margin again down to muscle where I had done the prior lumpectomy. This was marked as above.  I placed a new clip at superior margin. Case was then turned back over to Dr Iran Planas for closure.

## 2016-03-15 NOTE — Anesthesia Procedure Notes (Signed)
Procedure Name: LMA Insertion Date/Time: 03/15/2016 12:07 PM Performed by: Bethena Roys T Pre-anesthesia Checklist: Patient identified, Emergency Drugs available, Suction available and Patient being monitored Patient Re-evaluated:Patient Re-evaluated prior to inductionOxygen Delivery Method: Circle System Utilized Preoxygenation: Pre-oxygenation with 100% oxygen Intubation Type: IV induction Ventilation: Mask ventilation without difficulty LMA: LMA inserted LMA Size: 5.0 Number of attempts: 1 Airway Equipment and Method: Bite block Placement Confirmation: positive ETCO2 Tube secured with: Tape Dental Injury: Teeth and Oropharynx as per pre-operative assessment

## 2016-03-16 ENCOUNTER — Encounter (HOSPITAL_BASED_OUTPATIENT_CLINIC_OR_DEPARTMENT_OTHER): Payer: Self-pay | Admitting: General Surgery

## 2016-03-21 NOTE — Addendum Note (Signed)
Addendum  created 03/21/16 1716 by Josephine Igo, MD   Modules edited: Clinical Notes   Clinical Notes:  File: [id number]

## 2016-03-23 ENCOUNTER — Other Ambulatory Visit: Payer: Self-pay | Admitting: Hematology and Oncology

## 2016-03-24 ENCOUNTER — Encounter (HOSPITAL_COMMUNITY): Payer: Self-pay | Admitting: Internal Medicine

## 2016-03-24 ENCOUNTER — Ambulatory Visit (HOSPITAL_COMMUNITY)
Admission: RE | Admit: 2016-03-24 | Discharge: 2016-03-24 | Disposition: A | Discharge status: Home / Self Care | Payer: Commercial Managed Care - PPO | Source: Ambulatory Visit | Attending: Hematology and Oncology | Admitting: Hematology and Oncology

## 2016-03-24 ENCOUNTER — Ambulatory Visit (HOSPITAL_BASED_OUTPATIENT_CLINIC_OR_DEPARTMENT_OTHER)
Admission: RE | Admit: 2016-03-24 | Discharge: 2016-03-24 | Disposition: A | Discharge status: Home / Self Care | Payer: Commercial Managed Care - PPO | Source: Ambulatory Visit | Attending: Internal Medicine | Admitting: Internal Medicine

## 2016-03-24 VITALS — BP 134/82 | HR 113 | Wt 252.8 lb

## 2016-03-24 DIAGNOSIS — C50211 Malignant neoplasm of upper-inner quadrant of right female breast: Secondary | ICD-10-CM | POA: Insufficient documentation

## 2016-03-24 DIAGNOSIS — Z803 Family history of malignant neoplasm of breast: Secondary | ICD-10-CM | POA: Diagnosis not present

## 2016-03-24 DIAGNOSIS — Z880 Allergy status to penicillin: Secondary | ICD-10-CM | POA: Insufficient documentation

## 2016-03-24 DIAGNOSIS — Z170 Estrogen receptor positive status [ER+]: Secondary | ICD-10-CM | POA: Diagnosis not present

## 2016-03-24 DIAGNOSIS — E039 Hypothyroidism, unspecified: Secondary | ICD-10-CM | POA: Diagnosis not present

## 2016-03-24 DIAGNOSIS — Z79899 Other long term (current) drug therapy: Secondary | ICD-10-CM | POA: Insufficient documentation

## 2016-03-24 NOTE — Progress Notes (Signed)
Patient ID: Janet King, female   DOB: [birth year], 50 y.o.   MRN: [id number]    Cardio-Oncology Clinic Consult Note   Referring Physician: Dr Lindi Adie Primary Care: Primary Cardiologist: New   HPI:  Janet King is a 50 y.o. female with cancer of the right female breast diagnosed 3/17 referred by Dr. Lindi Adie for enrollment into the cardio-oncology clinic.   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 T2 N0 stage II a clinical stage  03/01/2016 Surgery Right lumpectomy: ILC, grade 2, 0.8 cm, ALH, additional superior margin : ILC grade to 0.3 cm , positive superior margin , 0/1 LN neg, mammoplasty bil : benign, ER 95%, PR 95%, HER-2 pos, Ki-67 5%, T1bN0 stage IA   Denies any known cardiac history. Still slightly sore from her recent surgery.  No SOB, CP, lightheadedness, peripheral edema at her baseline.  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 No other complaints apart from soreness at excision site. Wants to know if she can keep up her current level of activity.   Echo 03/24/16 LVEF 65-70%, Ls' 12.0, GLS -19.3  Review of Systems: [y] = yes, '[ ]'  = no   General: Weight gain '[ ]' ; Weight loss '[ ]' ; Anorexia '[ ]' ; Fatigue '[ ]' ; Fever '[ ]' ; Chills '[ ]' ; Weakness '[ ]'   Cardiac: Chest pain/pressure '[ ]' ; Resting SOB '[ ]' ; Exertional SOB '[ ]' ; Orthopnea '[ ]' ; Pedal Edema '[ ]' ; Palpitations '[ ]' ; Syncope '[ ]' ; Presyncope '[ ]' ; Paroxysmal nocturnal dyspnea'[ ]'   Pulmonary: Cough '[ ]' ; Wheezing'[ ]' ; Hemoptysis'[ ]' ; Sputum '[ ]' ; Snoring '[ ]'   GI: Vomiting'[ ]' ; Dysphagia'[ ]' ; Melena'[ ]' ; Hematochezia '[ ]' ; Heartburn'[ ]' ; Abdominal pain '[ ]' ; Constipation '[ ]' ; Diarrhea '[ ]' ; BRBPR '[ ]'   GU: Hematuria'[ ]' ; Dysuria '[ ]' ; Nocturia'[ ]'   Vascular:  Pain in legs with walking '[ ]' ; Pain in feet with lying flat '[ ]' ; Non-healing sores '[ ]' ; Stroke '[ ]' ; TIA '[ ]' ; Slurred speech '[ ]' ;  Neuro: Headaches'[ ]' ; Vertigo'[ ]' ; Seizures'[ ]' ; Paresthesias'[ ]' ;Blurred vision '[ ]' ; Diplopia '[ ]' ; Vision changes '[ ]'   Ortho/Skin: Arthritis '[ ]' ; Joint pain '[ ]' ; Muscle pain '[ ]' ; Joint swelling '[ ]' ; Back Pain '[ ]' ; Rash '[ ]'   Psych: Depression'[ ]' ; Anxiety'[ ]'   Heme: Bleeding problems '[ ]' ; Clotting disorders '[ ]' ; Anemia '[ ]'   Endocrine: Diabetes '[ ]' ; Thyroid dysfunction[y]   Past Medical History  Diagnosis Date  . Breast cancer ([place])     right  . Hypothyroidism   . Baker's cyst of knee   . H/O bone density study     Current Outpatient Prescriptions  Medication Sig Dispense Refill  . SYNTHROID 112 MCG tablet   0   No current facility-administered medications for this encounter.    Allergies  Allergen Reactions  . Penicillin G Rash      Social History   Social History  . Marital Status: Single    Spouse Name: N/A  . Number of Children: 0  . Years of Education: N/A   Occupational History  . 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 Smoking status: Never Smoker   . Smokeless tobacco: Never Used  .  Alcohol Use: 0.0 oz/week    0 Standard drinks or equivalent per week     Comment: occ - once every 3 mos  . Drug Use: No  . Sexual Activity: Not Currently   Other Topics Concern  . Not on file   Social History Narrative      Family History  Problem Relation Age of Onset  . Breast cancer Mother 56  . Heart disease Maternal Grandfather   . Colon cancer Paternal Grandmother     dx. 27s  . Cancer Other     maternal great aunt (MGM's sister) dx. with NOS cancer at older age  . Breast cancer Other     maternal great aunt (MGF's sister) dx. in her 62s; s/p mastectomy  . Cancer Other     paternal great aunt (PGM's sister) dx. with NOS cancer at older age    81:   03/24/16 1515  BP: 134/82  Pulse: 113    Weight: 252 lb 12 oz (114.647 kg)  SpO2: 97%    PHYSICAL EXAM: General:  Well appearing. No respiratory difficulty HEENT: normal Neck: supple. no JVD. Carotids 2+ bilat; no bruits. No lymphadenopathy or thryomegaly appreciated. Cor: PMI nondisplaced. Regular rate & rhythm. No rubs, gallops or murmurs. Lungs: clear Abdomen: Obese, soft, nontender, nondistended. No hepatosplenomegaly. No bruits or masses. Good bowel sounds. Extremities: no cyanosis, clubbing, rash, edema Neuro: alert & oriented x 3, cranial nerves grossly intact. moves all 4 extremities w/o difficulty. Affect pleasant.   Assessment/ Plan   1. Breast cancer of upper-inner quadrant of right female breast - s/p breast cancer excision 03/15/16  - To start adjuvant therapy May 17th with Taxol and Herceptin weekly x 12 followed by Herceptin maintenance every 3 weeks 1 year - Echo parameters stable as above.  Follow up 3 months after May 17th with repeat Echo.  Legrand Como [address]" [place], PA-C 03/24/2016 4:06 PM   Patient seen and examined with Oda Kilts, PA-C. We discussed all aspects of the encounter. I agree with the assessment and plan as stated above.   Explained incidence of Herceptin cardiotoxicity and role of Cardio-oncology clinic at length. Echo images reviewed personally. All parameters stable. Reviewed signs and symptoms of HF to look for. Continue Herceptin. Follow-up with echo in 3 months.  Bensimhon, Daniel,MD 3:34 PM

## 2016-03-24 NOTE — Progress Notes (Signed)
  Echocardiogram 2D Echocardiogram has been performed.  Janet King 03/24/2016, 12:02 PM

## 2016-03-24 NOTE — Patient Instructions (Signed)
Follow up and echo in 3 months with Dr.Bensimhon  

## 2016-03-28 ENCOUNTER — Telehealth: Payer: Self-pay | Admitting: Hematology and Oncology

## 2016-03-28 ENCOUNTER — Other Ambulatory Visit: Payer: Self-pay | Admitting: Hematology and Oncology

## 2016-03-28 DIAGNOSIS — Z1379 Encounter for other screening for genetic and chromosomal anomalies: Secondary | ICD-10-CM | POA: Insufficient documentation

## 2016-03-28 NOTE — Progress Notes (Signed)
GENETIC TEST RESULT  HPI: [facility]. Chronis [facility] due to a personal history of breast cancer at 89, family history of breast cancer and other cancers and concerns regarding a hereditary predisposition to cancer. Please refer to our prior cancer genetics clinic note from February 02, 2016 for more information regarding Ms. Panebianco's medical, social and family histories, and our assessment and recommendations, at the time. Ms. Mace recent genetic test results were disclosed to her, as were recommendations warranted by these results. These results and recommendations are discussed in more detail below.  GENETIC TEST RESULTS: At the time of Ms. Wissink's visit on 07/01/28, we recommended she pursue genetic testing of the 8-gene Breast High/Moderate Risk Panel with the 12-gene Custom Panel (essentially the 20-gene Breast/Ovarian Cancer Panel, broken up in order to receive most pertinent results more quickly) through [employer]. The Breast High/Moderate Risk gene panel offered by [facility] includes sequencing and deletion/duplication analysis of the following 8 genes: ATM, BRCA1, BRCA2, CDH1, CHEK2, PALB2, PTEN, and TP53.  The Custom Panel performed by [facility] Hope Pigeon, MD) includes sequencing and/or deletion/duplication analysis for the following 12 genes:  BARD1, BRIP1, EPCAM, FANCC, MLH1, MSH2, MSH6, NBN, PMS2, RAD51C, RAD51D, and XRCC2.  All results are now back, the report dates for which are February 11, 2016 and February 19, 2016, respectively.  Genetic testing was normal, and did not reveal a deleterious mutation in these genes.  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  The test report will be scanned into EPIC and will be located under the Results Review tab in the Pathology>Molecular Pathology section.   Genetic testing did identify a variant of uncertain significance (VUS) called "c.1417G>A (p.Glu473Lys)" in  one copy of the PMS2 gene. At this time, it is unknown if this VUS is associated with an increased risk for cancer or if this is a normal finding. Since this VUS result is uncertain, it cannot help guide screening recommendations, and family members should not be tested for this VUS to help define their own cancer risks.  Also, we all have variants within our genes that make [place] unique individuals--most of these variants are benign.  Thus, we treat this VUS as a negative result.   With time, we suspect the lab will reclassify this variant and when they do, we will try to re-contact Ms. Judon to discuss the reclassification further.  We also encouraged Ms. Zinger to contact us in a year or two to obtain an update on the status of this VUS.  We discussed with Ms. Paster that since the current genetic testing is not perfect, it is possible there may be a gene mutation in one of these genes that current testing cannot detect, but that chance is small. We also discussed, that it is possible that another gene that has not yet been discovered, or that we have not yet tested, is responsible for the cancer diagnoses in the family, and it is, therefore, important to remain in touch with cancer genetics in the future so that we can continue to offer Ms. Trueba the most up to date genetic testing.   CANCER SCREENING RECOMMENDATIONS: While we still do not have an explanation for the personal and family history of breast cancer, this result is reassuring and indicates that Ms. Fosnaugh likely does not have an increased risk for a future cancer due to a mutation in one of these genes. This normal test also  suggests that Ms. Gronau's cancer was most likely not due to an inherited predisposition associated with one of these genes.  Most cancers happen by chance and this negative test suggests that her cancer falls into this category.  We, therefore, recommended she continue to follow the cancer management and screening guidelines  provided by her oncology and primary healthcare providers.   RECOMMENDATIONS FOR FAMILY MEMBERS: Women in this family might be at some increased risk of developing cancer, over the general population risk, simply due to the family history of cancer. We recommended women in this family have a yearly mammogram beginning at age 3, or 42 years younger than the earliest onset of cancer, an an annual clinical breast exam, and perform monthly breast self-exams. Women in this family should also have a gynecological exam as recommended by their primary provider. All family members should have a colonoscopy by age 32.   FOLLOW-UP: Lastly, we discussed with Ms. Gaetano that cancer genetics is a rapidly advancing field and it is possible that new genetic tests will be appropriate for her and/or her family members in the future. We encouraged her to remain in contact with cancer genetics on an annual basis so we can update her personal and family histories and let her know of advances in cancer genetics that may benefit this family.   Our contact number was provided. Ms. Hoopingarner questions were answered to her satisfaction, and she knows she is welcome to call us at anytime with additional questions or concerns.   Jeanine Luz, MS, [facility] Certified Genetic Counselor [email or web address] Phone: [phone]

## 2016-03-28 NOTE — Telephone Encounter (Signed)
Spoke with patient to confirm 5/31 appt date/time

## 2016-03-30 ENCOUNTER — Other Ambulatory Visit: Payer: Commercial Managed Care - PPO

## 2016-03-30 ENCOUNTER — Ambulatory Visit: Payer: Commercial Managed Care - PPO

## 2016-03-30 ENCOUNTER — Ambulatory Visit: Payer: Commercial Managed Care - PPO | Admitting: Hematology and Oncology

## 2016-04-06 ENCOUNTER — Ambulatory Visit: Payer: Commercial Managed Care - PPO

## 2016-04-06 ENCOUNTER — Other Ambulatory Visit: Payer: Commercial Managed Care - PPO

## 2016-04-07 ENCOUNTER — Telehealth: Payer: Self-pay | Admitting: *Deleted

## 2016-04-07 NOTE — Telephone Encounter (Signed)
  Oncology Nurse Navigator Documentation    Navigator Encounter Type: Telephone (04/07/16 1400) Telephone: Lahoma Crocker Call;Appt Confirmation/Clarification (04/07/16 1400)  New chemo start date of 04/20/16                                      Time Spent with Patient: 15 (04/07/16 1400)

## 2016-04-07 NOTE — Telephone Encounter (Signed)
Left vm for pt to return call to discuss moving 1st chemo to 6/7 and cancelling chemo on 5/31. Contact information provided.

## 2016-04-08 ENCOUNTER — Telehealth: Payer: Self-pay | Admitting: *Deleted

## 2016-04-08 ENCOUNTER — Other Ambulatory Visit: Payer: Self-pay | Admitting: *Deleted

## 2016-04-08 DIAGNOSIS — C50211 Malignant neoplasm of upper-inner quadrant of right female breast: Secondary | ICD-10-CM

## 2016-04-08 MED ORDER — LIDOCAINE-PRILOCAINE 2.5-2.5 % EX CREA: TOPICAL_CREAM | CUTANEOUS | Status: DC

## 2016-04-08 MED ORDER — ONDANSETRON HCL 8 MG PO TABS: 8.0000 mg | ORAL_TABLET | Freq: Two times a day (BID) | ORAL | Status: DC | PRN

## 2016-04-08 MED ORDER — PROCHLORPERAZINE MALEATE 10 MG PO TABS: 10.0000 mg | ORAL_TABLET | Freq: Four times a day (QID) | ORAL | Status: DC | PRN

## 2016-04-08 MED ORDER — LORAZEPAM 0.5 MG PO TABS: 0.5000 mg | ORAL_TABLET | Freq: Every day | ORAL | Status: DC

## 2016-04-08 NOTE — Telephone Encounter (Signed)
Patient called asking for prescription for wig as well as her emla cream. Prescription left at cancer center front desk, premeds released to pharmacy.

## 2016-04-13 ENCOUNTER — Ambulatory Visit: Payer: Commercial Managed Care - PPO

## 2016-04-13 ENCOUNTER — Other Ambulatory Visit: Payer: Commercial Managed Care - PPO

## 2016-04-13 ENCOUNTER — Ambulatory Visit: Payer: Commercial Managed Care - PPO | Admitting: Hematology and Oncology

## 2016-04-19 ENCOUNTER — Ambulatory Visit: Payer: Commercial Managed Care - PPO | Attending: General Surgery | Admitting: Physical Therapy

## 2016-04-19 ENCOUNTER — Other Ambulatory Visit: Payer: Self-pay

## 2016-04-19 ENCOUNTER — Encounter (HOSPITAL_COMMUNITY): Payer: Self-pay

## 2016-04-19 DIAGNOSIS — Z483 Aftercare following surgery for neoplasm: Secondary | ICD-10-CM

## 2016-04-19 DIAGNOSIS — C50211 Malignant neoplasm of upper-inner quadrant of right female breast: Secondary | ICD-10-CM

## 2016-04-19 NOTE — Therapy (Signed)
[place], [place], [address] Phone: [phone]   Fax:  [phone]  Physical Therapy Evaluation  Patient Details  Name: Janet King MRN: [id number] Date of Birth: 09/13/1966 Referring Provider: Dr. Donne Hazel   Encounter Date: 04/19/2016      PT End of Session - 04/19/16 1542    Visit Number 1   PT Start Time 1300   PT Stop Time [id number]   PT Time Calculation (min) 53 min   Activity Tolerance Patient tolerated treatment well   Behavior During Therapy [facility] for tasks assessed/performed      Past Medical History  Diagnosis Date  . Breast cancer ([place])     right  . Hypothyroidism   . Baker's cyst of knee   . H/O bone density study     Past Surgical History  Procedure Laterality Date  . Tonsillectomy    . Radioactive seed guided mastectomy with axillary sentinel lymph node biopsy Right 03/01/2016    Procedure: RADIOACTIVE SEED GUIDED PARTIAL MASTECTOMY WITH AXILLARY SENTINEL LYMPH NODE BIOPSY;  Surgeon: Rolm Bookbinder, MD;  Location: [facility];  Service: General;  Laterality: Right;  . Peripheral vascular catheterization Right 03/01/2016    Procedure: PORTA CATH INSERTION;  Surgeon: Rolm Bookbinder, MD;  Location: [facility];  Service: General;  Laterality: Right;  . Breast reconstruction Bilateral 03/01/2016    Procedure: RIGHT ONCOPLASTIC BREAST RECONSTRUCTION WITH LEFT REDUCTION FOR SYMMETRY;  Surgeon: Irene Limbo, MD;  Location: [facility];  Service: Plastics;  Laterality: Bilateral;  . Re-excision of breast cancer,superior margins Right 03/15/2016    Procedure: RE-EXCISION OF BREAST CANCER, SUPERIOR MARGINS;  Surgeon: Rolm Bookbinder, MD;  Location: [facility];  Service: General;  Laterality: Right;    There were no vitals filed for this visit.       Subjective Assessment - 04/19/16 1319    Subjective Pt reports she has  had some trouble with incisions healing.  She feels that she will have limited acivity until incsions heal.  She has used multiple things to try to get it to heal    Pertinent History remote past histroy of tenditis in both arms that is fully recovered.    Patient Stated Goals no real goals and pt is not really sure why she is here.     Currently in Pain? Yes   Pain Score 3    Pain Location Chest   Pain Orientation Anterior   Pain Descriptors / Indicators Discomfort   Pain Type Surgical pain   Pain Onset More than a month ago   Pain Frequency Intermittent   Aggravating Factors  moving makes it worse.   Pain Relieving Factors better is leaving it alone and resting    Effect of Pain on Daily Activities has to modify her work activites and get help as needed             [facility] PT Assessment - 04/19/16 0001    Assessment   Medical Diagnosis right breast cancer    Referring Provider Dr. Donne Hazel    Onset Date/Surgical Date 03/01/16   Hand Dominance Right   Precautions   Precaution Comments restricted lifting    Restrictions   Weight Bearing Restrictions No   Balance Screen   Has the patient fallen in the past 6 months No   Has the patient had a decrease in activity level because of a fear of falling?  No   Is  the patient reluctant to leave their home because of a fear of falling?  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  He has heading incisions on anterior aspect of both  breasts that are red with incision on left breast still driaining thick fluid on lateral medial aspect.  She reports no odor and changes her dry dressing daily    Coordination   Gross Motor Movements are Fluid and Coordinated Yes   Posture/Postural Control   Posture/Postural Control No significant limitations   AROM   Right Shoulder Flexion 165 Degrees   Right Shoulder ABduction 90 Degrees   Left Shoulder Flexion 173 Degrees   Left Shoulder ABduction 168 Degrees   Left Shoulder External Rotation 90 Degrees   Strength   Overall Strength Comments pt reports she is not generally as strong as she has been and is cautious not to do too much because she is afraid she will get tendonitis again She wants to start her walking program again            LYMPHEDEMA/ONCOLOGY QUESTIONNAIRE - 04/19/16 1537    Type   Cancer Type breast    Surgeries   Lumpectomy Date 03/01/16  second surgery on May 2   Treatment   Active Chemotherapy Treatment --  to start tomorrow   Active Radiation Treatment --  plans to have it, does not know when    What other symptoms do you have   Are you Having Heaviness or Tightness No   Are you having Pain Yes  feels pulling in left breast    Are you having pitting edema No   Stemmer Sign No   Right Upper Extremity Lymphedema   10 cm Proximal to Olecranon Process 40 cm   Olecranon Process 31 cm   15 cm Proximal to Ulnar Styloid Process 31.5 cm   Just Proximal to Ulnar Styloid Process 17.5 cm   Across Hand at [employer] 21.1 cm   At [place] of 2nd Digit 6 cm   Left Upper Extremity Lymphedema   10 cm Proximal to Olecranon Process 39.5 cm   Olecranon Process 31 cm   15 cm Proximal to Ulnar Styloid Process 29.5 cm   Just Proximal to Ulnar Styloid Process 17.5 cm   Across Hand at [employer] 20.1 cm   At [place] of 2nd Digit 6 cm           Quick Dash - 04/19/16 0001    Open a tight or new jar Moderate difficulty   Do heavy household chores (wash walls,  wash floors) Moderate difficulty   Carry a shopping bag or briefcase Mild difficulty   Wash your back No difficulty   Use a knife to cut food No difficulty   Recreational activities in which you take some force or impact through your arm, shoulder, or hand (golf, hammering, tennis) No difficulty   During the past week, to what extent has your arm, shoulder or hand problem  interfered with your normal social activities with family, friends, neighbors, or groups? Not at all   During the past week, to what extent has your arm, shoulder or hand problem limited your work or other regular daily activities Modererately   Arm, shoulder, or hand pain. None   Tingling (pins and needles) in your arm, shoulder, or hand Moderate   Difficulty Sleeping No difficulty   DASH Score 20.45 %             [facility] Adult PT Treatment/Exercise - 04/19/16 0001    Self-Care   Self-Care Other Self-Care Comments   Other Self-Care Comments  showed range of motion exercise to, both actively and with dowel rod and provided handouts from ABC class and for [employer] program. Talked with pt about strength ABC program, but she does not want to come back to learn that now because she is not sure how chemo will go and she already has to take one day off from work for that.                 PT Education - 04/19/16 1359    Education provided Yes   Education Details issued infomation about and flyers from   [employer] class and [employer].  Shoulder range of motion HEP   Person(s) Educated Patient   Methods Explanation;Demonstration;Handout   Comprehension Verbalized understanding                Long Term Clinic Goals - 04/19/16 1547    CC Long Term Goal  #1   Title Pt will be verbalize basic knowledge of precautions afer breast cancer surgery and exercises for shoulder range of motion    Time 1   Period Days   Status Achieved            Plan - 04/19/16 1542    Clinical Impression Statement Pt has  good range of motion and should be able to get in position for radiation when needed.  She does not show signs of lymphedema.  She would benefit from eventual exercise for strengthening, but she does not want to do that now .  She is concerned about her delayed wound healing and is not sure how she will do with chemotherapy.  She was given written information and will call us at a later time if she wants more insruction. Pt also encouraged to go to [employer] for a compression bra when she is ready for that.    Rehab Potential Good   Clinical Impairments Affecting Rehab Potential delayed healing of breast incisions    PT Frequency One time visit   PT Treatment/Interventions Patient/family education   Recommended Other Services ABC class   Consulted and Agree with Plan of Care Patient      Patient will benefit from skilled therapeutic intervention in order to improve the following deficits and impairments:  Decreased knowledge of precautions, Decreased strength  Visit Diagnosis: Aftercare following surgery for neoplasm - Plan: PT plan of care cert/re-cert     Problem List Patient Active Problem List   Diagnosis Date Noted  . Genetic testing 03/28/2016  . Family history of breast cancer in mother 02/03/2016  . Family history of colon cancer 02/03/2016  . Breast cancer of upper-inner quadrant of right female breast ([place]) 02/02/2016   Donato Heinz. Owens Shark, PT  04/19/2016, 3:51 PM  [place], [place], [address] Phone: [phone]   Fax:  [phone]  Name: Janet King MRN: [id number] Date of Birth: 1966/06/02

## 2016-04-20 ENCOUNTER — Ambulatory Visit (HOSPITAL_BASED_OUTPATIENT_CLINIC_OR_DEPARTMENT_OTHER): Payer: Commercial Managed Care - PPO

## 2016-04-20 ENCOUNTER — Ambulatory Visit (HOSPITAL_BASED_OUTPATIENT_CLINIC_OR_DEPARTMENT_OTHER): Payer: Commercial Managed Care - PPO | Admitting: Hematology and Oncology

## 2016-04-20 ENCOUNTER — Other Ambulatory Visit (HOSPITAL_BASED_OUTPATIENT_CLINIC_OR_DEPARTMENT_OTHER): Payer: Commercial Managed Care - PPO

## 2016-04-20 ENCOUNTER — Encounter: Payer: Self-pay | Admitting: Hematology and Oncology

## 2016-04-20 VITALS — BP 129/72 | HR 76 | Temp 98.5°F | Resp 18

## 2016-04-20 VITALS — BP 150/97 | HR 96 | Temp 98.5°F | Resp 20 | Ht 66.0 in | Wt 253.3 lb

## 2016-04-20 DIAGNOSIS — Z5112 Encounter for antineoplastic immunotherapy: Secondary | ICD-10-CM | POA: Diagnosis not present

## 2016-04-20 DIAGNOSIS — Z170 Estrogen receptor positive status [ER+]: Secondary | ICD-10-CM | POA: Diagnosis not present

## 2016-04-20 DIAGNOSIS — C50211 Malignant neoplasm of upper-inner quadrant of right female breast: Secondary | ICD-10-CM

## 2016-04-20 DIAGNOSIS — Z5111 Encounter for antineoplastic chemotherapy: Secondary | ICD-10-CM

## 2016-04-20 LAB — CBC WITH DIFFERENTIAL/PLATELET
BASO%: 0.6 % (ref 0.0–2.0)
Basophils Absolute: 0.1 10*3/uL (ref 0.0–0.1)
EOS ABS: 0.4 10*3/uL (ref 0.0–0.5)
EOS%: 4.9 % (ref 0.0–7.0)
HEMATOCRIT: 38.8 % (ref 34.8–46.6)
HGB: 12.6 g/dL (ref 11.6–15.9)
LYMPH#: 2.1 10*3/uL (ref 0.9–3.3)
LYMPH%: 24.1 % (ref 14.0–49.7)
MCH: 28.2 pg (ref 25.1–34.0)
MCHC: 32.5 g/dL (ref 31.5–36.0)
MCV: 86.8 fL (ref 79.5–101.0)
MONO#: 0.7 10*3/uL (ref 0.1–0.9)
MONO%: 7.9 % (ref 0.0–14.0)
NEUT#: 5.5 10*3/uL (ref 1.5–6.5)
NEUT%: 62.5 % (ref 38.4–76.8)
PLATELETS: 202 10*3/uL (ref 145–400)
RBC: 4.47 10*6/uL (ref 3.70–5.45)
RDW: 13.4 % (ref 11.2–14.5)
WBC: 8.8 10*3/uL (ref 3.9–10.3)

## 2016-04-20 LAB — COMPREHENSIVE METABOLIC PANEL
ALT: 13 U/L (ref 0–55)
ANION GAP: 9 meq/L (ref 3–11)
AST: 14 U/L (ref 5–34)
Albumin: 3.5 g/dL (ref 3.5–5.0)
Alkaline Phosphatase: 74 U/L (ref 40–150)
BUN: 12.9 mg/dL (ref 7.0–26.0)
CALCIUM: 9 mg/dL (ref 8.4–10.4)
CHLORIDE: 111 meq/L — AB (ref 98–109)
CO2: 21 mEq/L — ABNORMAL LOW (ref 22–29)
Creatinine: 1 mg/dL (ref 0.6–1.1)
EGFR: 68 mL/min/{1.73_m2} — AB (ref >90)
Glucose: 154 mg/dl — ABNORMAL HIGH (ref 70–140)
POTASSIUM: 3.7 meq/L (ref 3.5–5.1)
Sodium: 141 mEq/L (ref 136–145)
Total Bilirubin: 0.39 mg/dL (ref 0.20–1.20)
Total Protein: 7.4 g/dL (ref 6.4–8.3)

## 2016-04-20 MED ORDER — DIPHENHYDRAMINE HCL 50 MG/ML IJ SOLN
25.0000 mg | Freq: Once | INTRAMUSCULAR | Status: AC
  Administered 2016-04-20: 25 mg via INTRAVENOUS

## 2016-04-20 MED ORDER — SODIUM CHLORIDE 0.9 % IV SOLN
Freq: Once | INTRAVENOUS | Status: AC
  Administered 2016-04-20: 09:00:00 via INTRAVENOUS

## 2016-04-20 MED ORDER — DIPHENHYDRAMINE HCL 50 MG/ML IJ SOLN
INTRAMUSCULAR | Status: AC
  Filled 2016-04-20: qty 1

## 2016-04-20 MED ORDER — SODIUM CHLORIDE 0.9 % IV SOLN
10.0000 mg | Freq: Once | INTRAVENOUS | Status: AC
  Administered 2016-04-20: 10 mg via INTRAVENOUS
  Filled 2016-04-20: qty 1

## 2016-04-20 MED ORDER — FAMOTIDINE IN NACL 20-0.9 MG/50ML-% IV SOLN
20.0000 mg | Freq: Once | INTRAVENOUS | Status: AC
  Administered 2016-04-20: 20 mg via INTRAVENOUS

## 2016-04-20 MED ORDER — FAMOTIDINE IN NACL 20-0.9 MG/50ML-% IV SOLN
INTRAVENOUS | Status: AC
  Filled 2016-04-20: qty 50

## 2016-04-20 MED ORDER — ACETAMINOPHEN 325 MG PO TABS
ORAL_TABLET | ORAL | Status: AC
  Filled 2016-04-20: qty 2

## 2016-04-20 MED ORDER — TRASTUZUMAB CHEMO INJECTION 440 MG
4.0000 mg/kg | Freq: Once | INTRAVENOUS | Status: AC
  Administered 2016-04-20: 462 mg via INTRAVENOUS
  Filled 2016-04-20: qty 22

## 2016-04-20 MED ORDER — SODIUM CHLORIDE 0.9% FLUSH
10.0000 mL | INTRAVENOUS | Status: DC | PRN
  Administered 2016-04-20: 10 mL
  Filled 2016-04-20: qty 10

## 2016-04-20 MED ORDER — HEPARIN SOD (PORK) LOCK FLUSH 100 UNIT/ML IV SOLN
500.0000 [IU] | Freq: Once | INTRAVENOUS | Status: AC | PRN
  Administered 2016-04-20: 500 [IU]
  Filled 2016-04-20: qty 5

## 2016-04-20 MED ORDER — LORAZEPAM 0.5 MG PO TABS: 0.5000 mg | ORAL_TABLET | Freq: Every day | ORAL | Status: DC

## 2016-04-20 MED ORDER — PACLITAXEL CHEMO INJECTION 300 MG/50ML
80.0000 mg/m2 | Freq: Once | INTRAVENOUS | Status: AC
  Administered 2016-04-20: 186 mg via INTRAVENOUS
  Filled 2016-04-20: qty 31

## 2016-04-20 MED ORDER — ACETAMINOPHEN 325 MG PO TABS
650.0000 mg | ORAL_TABLET | Freq: Once | ORAL | Status: AC
  Administered 2016-04-20: 650 mg via ORAL

## 2016-04-20 NOTE — Assessment & Plan Note (Signed)
Right lumpectomy 03/01/2016: ILC, grade 2, 0.8 cm, ALH, additional superior margin : ILC grade to 0.3 cm , positive superior margin , 0/1 LN neg, mammoplasty bil : benign, ER 95%, PR 95%, HER-2 positive on primary tumor and negative on secondary mass which had a ratio of 1.55, gene copy number is 2.25, Ki-67 5%, T1bN0 stage IA  HER-2: I discussed with the patient that the HER-2 was positive in the original tumor and the HER-2 on the second excision was negative. I was able to get this clarification from pathology. So the treatment plan is once again changed. Reexcision of superior margin: 5 2017: Benign  Treatment plan: 1. Adjuvant therapy with Taxol and Herceptin weekly 12 followed by Herceptin maintenance every 3 weeks 1 year 2. Follow-up adjuvant radiation followed by 3. Antiestrogen therapy ----------------------------------------------------------------------------------------------------------------------------- Current treatment: Cycle 1 day 1 Taxol Herceptin Echocardiogram 03/24/2016: EF 60-65% Antiemetics were reviewed Labs were reviewed Return to clinic in 1 week for toxicity check

## 2016-04-20 NOTE — Patient Instructions (Signed)
[facility] Discharge Instructions for Patients Receiving Chemotherapy  Today you received the following chemotherapy agents Paclitaxel/Herceptin.   To help prevent nausea and vomiting after your treatment, we encourage you to take your nausea medication as directed.    If you develop nausea and vomiting that is not controlled by your nausea medication, call the clinic.   BELOW ARE SYMPTOMS THAT SHOULD BE REPORTED IMMEDIATELY:  *FEVER GREATER THAN 100.5 F  *CHILLS WITH OR WITHOUT FEVER  NAUSEA AND VOMITING THAT IS NOT CONTROLLED WITH YOUR NAUSEA MEDICATION  *UNUSUAL SHORTNESS OF BREATH  *UNUSUAL BRUISING OR BLEEDING  TENDERNESS IN MOUTH AND THROAT WITH OR WITHOUT PRESENCE OF ULCERS  *URINARY PROBLEMS  *BOWEL PROBLEMS  UNUSUAL RASH Items with * indicate a potential emergency and should be followed up as soon as possible.  Feel free to call the clinic you have any questions or concerns. The clinic phone number is [phone].  Please show the [facility] at check-in to the Emergency Department and triage nurse.  Paclitaxel injection What is this medicine? PACLITAXEL (PAK li TAX el) is a chemotherapy drug. It targets fast dividing cells, like cancer cells, and causes these cells to die. This medicine is used to treat ovarian cancer, breast cancer, and other cancers. This medicine may be used for other purposes; ask your health care provider or pharmacist if you have questions. What should I tell my health care provider before I take this medicine? They need to know if you have any of these conditions: -blood disorders -irregular heartbeat -infection (especially a virus infection such as chickenpox, cold sores, or herpes) -liver disease -previous or ongoing radiation therapy -an unusual or allergic reaction to paclitaxel, alcohol, polyoxyethylated castor oil, other chemotherapy agents, other medicines, foods, dyes, or preservatives -pregnant or trying  to get pregnant -breast-feeding How should I use this medicine? This drug is given as an infusion into a vein. It is administered in a hospital or clinic by a specially trained health care professional. Talk to your pediatrician regarding the use of this medicine in children. Special care may be needed. Overdosage: If you think you have taken too much of this medicine contact a poison control center or emergency room at once. NOTE: This medicine is only for you. Do not share this medicine with others. What if I miss a dose? It is important not to miss your dose. Call your doctor or health care professional if you are unable to keep an appointment. What may interact with this medicine? Do not take this medicine with any of the following medications: -disulfiram -metronidazole This medicine may also interact with the following medications: -cyclosporine -diazepam -ketoconazole -medicines to increase blood counts like filgrastim, pegfilgrastim, sargramostim -other chemotherapy drugs like cisplatin, doxorubicin, epirubicin, etoposide, teniposide, vincristine -quinidine -testosterone -vaccines -verapamil Talk to your doctor or health care professional before taking any of these medicines: -acetaminophen -aspirin -ibuprofen -ketoprofen -naproxen This list may not describe all possible interactions. Give your health care provider a list of all the medicines, herbs, non-prescription drugs, or dietary supplements you use. Also tell them if you smoke, drink alcohol, or use illegal drugs. Some items may interact with your medicine. What should I watch for while using this medicine? Your condition will be monitored carefully while you are receiving this medicine. You will need important blood work done while you are taking this medicine. This drug may make you feel generally unwell. This is not uncommon, as chemotherapy can affect healthy cells as well  healthy cells as well as cancer cells. Report any side  effects. Continue your course of treatment even though you feel ill unless your doctor tells you to stop. This medicine can cause serious allergic reactions. To reduce your risk you will need to take other medicine(s) before treatment with this medicine. In some cases, you may be given additional medicines to help with side effects. Follow all directions for their use. Call your doctor or health care professional for advice if you get a fever, chills or sore throat, or other symptoms of a cold or flu. Do not treat yourself. This drug decreases your body's ability to fight infections. Try to avoid being around people who are sick. This medicine may increase your risk to bruise or bleed. Call your doctor or health care professional if you notice any unusual bleeding. Be careful brushing and flossing your teeth or using a toothpick because you may get an infection or bleed more easily. If you have any dental work done, tell your dentist you are receiving this medicine. Avoid taking products that contain aspirin, acetaminophen, ibuprofen, naproxen, or ketoprofen unless instructed by your doctor. These medicines may hide a fever. Do not become pregnant while taking this medicine. Women should inform their doctor if they wish to become pregnant or think they might be pregnant. There is a potential for serious side effects to an unborn child. Talk to your health care professional or pharmacist for more information. Do not breast-feed an infant while taking this medicine. Men are advised not to father a child while receiving this medicine. This product may contain alcohol. Ask your pharmacist or healthcare provider if this medicine contains alcohol. Be sure to tell all healthcare providers you are taking this medicine. Certain medicines, like metronidazole and disulfiram, can cause an unpleasant reaction when taken with alcohol. The reaction includes flushing, headache, nausea, vomiting, sweating, and increased  thirst. The reaction can last from 30 minutes to several hours. What side effects may I notice from receiving this medicine? Side effects that you should report to your doctor or health care professional as soon as possible: -allergic reactions like skin rash, itching or hives, swelling of the face, lips, or tongue -low blood counts - This drug may decrease the number of white blood cells, red blood cells and platelets. You may be at increased risk for infections and bleeding. -signs of infection - fever or chills, cough, sore throat, pain or difficulty passing urine -signs of decreased platelets or bleeding - bruising, pinpoint red spots on the skin, black, tarry stools, nosebleeds -signs of decreased red blood cells - unusually weak or tired, fainting spells, lightheadedness -breathing problems -chest pain -high or low blood pressure -mouth sores -nausea and vomiting -pain, swelling, redness or irritation at the injection site -pain, tingling, numbness in the hands or feet -slow or irregular heartbeat -swelling of the ankle, feet, hands Side effects that usually do not require medical attention (report to your doctor or health care professional if they continue or are bothersome): -bone pain -complete hair loss including hair on your head, underarms, pubic hair, eyebrows, and eyelashes -changes in the color of fingernails -diarrhea -loosening of the fingernails -loss of appetite -muscle or joint pain -red flush to skin -sweating This list may not describe all possible side effects. Call your doctor for medical advice about side effects. You may report side effects to FDA at 1-800-FDA-1088. Where should I keep my medicine? This drug is given in a hospital or clinic and will   home. NOTE: This sheet is a summary. It may not cover all possible information. If you have questions about this medicine, talk to your doctor, pharmacist, or health care provider.    2016, [employer]  Standard. (2015-06-18 13:02:56)   Trastuzumab injection for infusion What is this medicine? TRASTUZUMAB (tras TOO zoo mab) is a monoclonal antibody. It is used to treat breast cancer and stomach cancer. This medicine may be used for other purposes; ask your health care provider or pharmacist if you have questions. What should I tell my health care provider before I take this medicine? They need to know if you have any of these conditions: -heart disease -heart failure -infection (especially a virus infection such as chickenpox, cold sores, or herpes) -lung or breathing disease, like asthma -recent or ongoing radiation therapy -an unusual or allergic reaction to trastuzumab, benzyl alcohol, or other medications, foods, dyes, or preservatives -pregnant or trying to get pregnant -breast-feeding How should I use this medicine? This drug is given as an infusion into a vein. It is administered in a hospital or clinic by a specially trained health care professional. Talk to your pediatrician regarding the use of this medicine in children. This medicine is not approved for use in children. Overdosage: If you think you have taken too much of this medicine contact a poison control center or emergency room at once. NOTE: This medicine is only for you. Do not share this medicine with others. What if I miss a dose? It is important not to miss a dose. Call your doctor or health care professional if you are unable to keep an appointment. What may interact with this medicine? -doxorubicin -warfarin This list may not describe all possible interactions. Give your health care provider a list of all the medicines, herbs, non-prescription drugs, or dietary supplements you use. Also tell them if you smoke, drink alcohol, or use illegal drugs. Some items may interact with your medicine. What should I watch for while using this medicine? Visit your doctor for checks on your progress. Report any side effects.  Continue your course of treatment even though you feel ill unless your doctor tells you to stop. Call your doctor or health care professional for advice if you get a fever, chills or sore throat, or other symptoms of a cold or flu. Do not treat yourself. Try to avoid being around people who are sick. You may experience fever, chills and shaking during your first infusion. These effects are usually mild and can be treated with other medicines. Report any side effects during the infusion to your health care professional. Fever and chills usually do not happen with later infusions. Do not become pregnant while taking this medicine or for 7 months after stopping it. Women should inform their doctor if they wish to become pregnant or think they might be pregnant. Women of child-bearing potential will need to have a negative pregnancy test before starting this medicine. There is a potential for serious side effects to an unborn child. Talk to your health care professional or pharmacist for more information. Do not breast-feed an infant while taking this medicine or for 7 months after stopping it. Women must use effective birth control with this medicine. What side effects may I notice from receiving this medicine? Side effects that you should report to your doctor or other health care professional as soon as possible: -breathing difficulties -chest pain or palpitations -cough -dizziness or fainting -fever or chills, sore throat -skin rash, itching or hives -swelling  of the legs or ankles -unusually weak or tired Side effects that usually do not require medical attention (report to your doctor or other health care professional if they continue or are bothersome): -loss of appetite -headache -muscle aches -nausea This list may not describe all possible side effects. Call your doctor for medical advice about side effects. You may report side effects to FDA at 1-800-FDA-1088. Where should I keep my  medicine? This drug is given in a hospital or clinic and will not be stored at home. NOTE: This sheet is a summary. It may not cover all possible information. If you have questions about this medicine, talk to your doctor, pharmacist, or health care provider.    2016, [employer]vier/Gold Standard. (2015-02-06 11:49:32)

## 2016-04-20 NOTE — Progress Notes (Signed)
Patient Care Team: [name], MD as PCP - General (Obstetrics and Gynecology)  SUMMARY OF ONCOLOGIC HISTORY:   Breast cancer of upper-inner quadrant of right female breast ([place])   01/21/2016 Initial Diagnosis Right breast biopsy: Invasive lobular cancer, ER 95%, PR 95%, Ki-67 5%, HER-2 positive ratio 2.52   02/01/2016 Breast MRI Right breast: Post biopsy hematoma measuring 1.9 x 1.3 x 2.5 cm surrounding ring of reactive enhancement, no lymphadenopathy. T2 N0 stage II a clinical stage   03/01/2016 Surgery Right lumpectomy: ILC, grade 2, 0.8 cm, ALH, additional superior margin : ILC grade to 0.3 cm , positive superior margin , 0/1 LN neg, mammoplasty bil : benign, ER 95%, PR 95%, HER-2 pos, Ki-67 5%, T1bN0 stage IA   03/15/2016 Surgery Right breast. Margin excision: Benign   04/20/2016 -  Chemotherapy Taxol Herceptin weekly 12 followed by Herceptin maintenance for 1 year    CHIEF COMPLIANT: Cycle 1 day 1 Taxol Herceptin  INTERVAL HISTORY: Janet LAMOUR is a 50 year old above-mentioned history of right breast invasive lobular cancer treated with lumpectomy and reexcision and breast reduction surgery on the left side. She has had a long prolonged healing from the surgery which delayed chemotherapy. She continues to still have a wound that has been packed. But we received the okay to start her chemotherapy. She is here today with her mother to start treatment.   REVIEW OF SYSTEMS:   Constitutional: Denies fevers, chills or abnormal weight loss Eyes: Denies blurriness of vision Ears, nose, mouth, throat, and face: Denies mucositis or sore throat Respiratory: Denies cough, dyspnea or wheezes Cardiovascular: Denies palpitation, chest discomfort Gastrointestinal:  Denies nausea, heartburn or change in bowel habits Skin: Denies abnormal skin rashes Lymphatics: Denies new lymphadenopathy or easy bruising Neurological:Denies numbness, tingling or new weaknesses Behavioral/Psych: Mood is stable,  no new changes  Extremities: No lower extremity edema Breast:  Has packing All other systems were reviewed with the patient and are negative.  I have reviewed the past medical history, past surgical history, social history and family history with the patient and they are unchanged from previous note.  ALLERGIES:  is allergic to penicillin g.  MEDICATIONS:  Current Outpatient Prescriptions  Medication Sig Dispense Refill  . lidocaine-prilocaine (EMLA) cream Apply to affected area once 30 g 3  . LORazepam (ATIVAN) 0.5 MG tablet Take 1 tablet (0.5 mg total) by mouth at bedtime. 30 tablet 0  . ondansetron (ZOFRAN) 8 MG tablet Take 1 tablet (8 mg total) by mouth 2 (two) times daily as needed (Nausea or vomiting). 30 tablet 1  . prochlorperazine (COMPAZINE) 10 MG tablet Take 1 tablet (10 mg total) by mouth every 6 (six) hours as needed (Nausea or vomiting). 30 tablet 1  . SYNTHROID 112 MCG tablet   0  . Vitamin D, Ergocalciferol, (DRISDOL) 50000 units CAPS capsule Take 50,000 Units by mouth every 7 (seven) days.     No current facility-administered medications for this visit.    PHYSICAL EXAMINATION: ECOG PERFORMANCE STATUS: 1 - Symptomatic but completely ambulatory  Filed Vitals:   04/20/16 0826  BP: 150/97  Pulse: 96  Temp: 98.5 F (36.9 C)  Resp: 20   Filed Weights   04/20/16 0826  Weight: 253 lb 4.8 oz (114.896 kg)    GENERAL:alert, no distress and comfortable SKIN: skin color, texture, turgor are normal, no rashes or significant lesions EYES: normal, Conjunctiva are pink and non-injected, sclera clear OROPHARYNX:no exudate, no erythema and lips, buccal mucosa, and tongue normal  NECK: supple, thyroid normal size, non-tender, without nodularity LYMPH:  no palpable lymphadenopathy in the cervical, axillary or inguinal LUNGS: clear to auscultation and percussion with normal breathing effort HEART: regular rate & rhythm and no murmurs and no lower extremity  edema ABDOMEN:abdomen soft, non-tender and normal bowel sounds MUSCULOSKELETAL:no cyanosis of digits and no clubbing  NEURO: alert & oriented x 3 with fluent speech, no focal motor/sensory deficits EXTREMITIES: No lower extremity edema  LABORATORY DATA:  I have reviewed the data as listed   Chemistry      Component Value Date/Time   NA 141 04/17/2007 0925   K 4.1 04/17/2007 0925   CL 111 04/17/2007 0925   CO2 26 04/17/2007 0925   BUN 12 04/17/2007 0925   CREATININE 0.8 04/17/2007 0925      Component Value Date/Time   CALCIUM 9.1 04/17/2007 0925       Lab Results  Component Value Date   WBC 8.8 04/20/2016   HGB 12.6 04/20/2016   HCT 38.8 04/20/2016   MCV 86.8 04/20/2016   PLT 202 04/20/2016   NEUTROABS 5.5 04/20/2016     ASSESSMENT & PLAN:  Breast cancer of upper-inner quadrant of right female breast ([place]) Right lumpectomy 03/01/2016: ILC, grade 2, 0.8 cm, ALH, additional superior margin : ILC grade to 0.3 cm , positive superior margin , 0/1 LN neg, mammoplasty bil : benign, ER 95%, PR 95%, HER-2 positive on primary tumor and negative on secondary mass which had a ratio of 1.55, gene copy number is 2.25, Ki-67 5%, T1bN0 stage IA  HER-2: I discussed with the patient that the HER-2 was positive in the original tumor and the HER-2 on the second excision was negative. I was able to get this clarification from pathology. So the treatment plan is once again changed. Reexcision of superior margin: 5 2017: Benign  Treatment plan: 1. Adjuvant therapy with Taxol and Herceptin weekly 12 followed by Herceptin maintenance every 3 weeks 1 year 2. Follow-up adjuvant radiation followed by 3. Antiestrogen therapy ----------------------------------------------------------------------------------------------------------------------------- Current treatment: Cycle 1 day 1 Taxol Herceptin Echocardiogram 03/24/2016: EF 60-65% Antiemetics were reviewed Labs were reviewed Return to clinic  in 1 week for toxicity check  No orders of the defined types were placed in this encounter.   The patient has a good understanding of the overall plan. she agrees with it. she will call with any problems that may develop before the next visit here.   Rulon Eisenmenger, MD 04/20/2016

## 2016-04-21 ENCOUNTER — Telehealth: Payer: Self-pay | Admitting: *Deleted

## 2016-04-21 NOTE — Telephone Encounter (Signed)
Called pt to see assess needs after 1st chemo on 04/20/16. Relate doing well and without complaints. Denies needs or questions.

## 2016-04-22 ENCOUNTER — Telehealth: Payer: Self-pay | Admitting: *Deleted

## 2016-04-22 NOTE — Telephone Encounter (Signed)
Chemotherapy f/u call to patient. She states she states she is doing well . Denies any issues/side effects at this time.

## 2016-04-22 NOTE — Telephone Encounter (Signed)
-----   Message from Ronnette Juniper, RN sent at 04/20/2016  1:43 PM EDT ----- Regarding: 1st time [place] 1st time: paclitaxel/herceptin Lindi Adie

## 2016-04-26 ENCOUNTER — Other Ambulatory Visit: Payer: Self-pay | Admitting: *Deleted

## 2016-04-26 DIAGNOSIS — C50211 Malignant neoplasm of upper-inner quadrant of right female breast: Secondary | ICD-10-CM

## 2016-04-27 ENCOUNTER — Ambulatory Visit (HOSPITAL_BASED_OUTPATIENT_CLINIC_OR_DEPARTMENT_OTHER): Payer: Commercial Managed Care - PPO

## 2016-04-27 ENCOUNTER — Encounter: Payer: Self-pay | Admitting: Hematology and Oncology

## 2016-04-27 ENCOUNTER — Telehealth: Payer: Self-pay | Admitting: Hematology and Oncology

## 2016-04-27 ENCOUNTER — Other Ambulatory Visit (HOSPITAL_BASED_OUTPATIENT_CLINIC_OR_DEPARTMENT_OTHER): Payer: Commercial Managed Care - PPO

## 2016-04-27 ENCOUNTER — Ambulatory Visit (HOSPITAL_BASED_OUTPATIENT_CLINIC_OR_DEPARTMENT_OTHER): Payer: Commercial Managed Care - PPO | Admitting: Hematology and Oncology

## 2016-04-27 VITALS — BP 129/79 | HR 94 | Temp 98.3°F | Resp 18 | Ht 66.0 in | Wt 250.4 lb

## 2016-04-27 DIAGNOSIS — Z5111 Encounter for antineoplastic chemotherapy: Secondary | ICD-10-CM | POA: Diagnosis not present

## 2016-04-27 DIAGNOSIS — Z170 Estrogen receptor positive status [ER+]: Secondary | ICD-10-CM | POA: Diagnosis not present

## 2016-04-27 DIAGNOSIS — C50211 Malignant neoplasm of upper-inner quadrant of right female breast: Secondary | ICD-10-CM

## 2016-04-27 DIAGNOSIS — Z5112 Encounter for antineoplastic immunotherapy: Secondary | ICD-10-CM | POA: Diagnosis not present

## 2016-04-27 LAB — CBC WITH DIFFERENTIAL/PLATELET
BASO%: 0.3 % (ref 0.0–2.0)
BASOS ABS: 0 10*3/uL (ref 0.0–0.1)
EOS ABS: 0.4 10*3/uL (ref 0.0–0.5)
EOS%: 4.9 % (ref 0.0–7.0)
HCT: 36.5 % (ref 34.8–46.6)
HGB: 12 g/dL (ref 11.6–15.9)
LYMPH%: 26.3 % (ref 14.0–49.7)
MCH: 28.4 pg (ref 25.1–34.0)
MCHC: 32.9 g/dL (ref 31.5–36.0)
MCV: 86.5 fL (ref 79.5–101.0)
MONO#: 0.3 10*3/uL (ref 0.1–0.9)
MONO%: 4.3 % (ref 0.0–14.0)
NEUT#: 4.6 10*3/uL (ref 1.5–6.5)
NEUT%: 64.2 % (ref 38.4–76.8)
PLATELETS: 219 10*3/uL (ref 145–400)
RBC: 4.22 10*6/uL (ref 3.70–5.45)
RDW: 13 % (ref 11.2–14.5)
WBC: 7.2 10*3/uL (ref 3.9–10.3)
lymph#: 1.9 10*3/uL (ref 0.9–3.3)

## 2016-04-27 LAB — COMPREHENSIVE METABOLIC PANEL
ALT: 15 U/L (ref 0–55)
ANION GAP: 8 meq/L (ref 3–11)
AST: 15 U/L (ref 5–34)
Albumin: 3.4 g/dL — ABNORMAL LOW (ref 3.5–5.0)
Alkaline Phosphatase: 73 U/L (ref 40–150)
BILIRUBIN TOTAL: 0.45 mg/dL (ref 0.20–1.20)
BUN: 17.9 mg/dL (ref 7.0–26.0)
CHLORIDE: 109 meq/L (ref 98–109)
CO2: 22 meq/L (ref 22–29)
Calcium: 8.7 mg/dL (ref 8.4–10.4)
Creatinine: 1 mg/dL (ref 0.6–1.1)
EGFR: 66 mL/min/{1.73_m2} — AB (ref >90)
GLUCOSE: 175 mg/dL — AB (ref 70–140)
POTASSIUM: 3.7 meq/L (ref 3.5–5.1)
SODIUM: 139 meq/L (ref 136–145)
Total Protein: 7.2 g/dL (ref 6.4–8.3)

## 2016-04-27 MED ORDER — ACETAMINOPHEN 325 MG PO TABS
650.0000 mg | ORAL_TABLET | Freq: Once | ORAL | Status: AC
  Administered 2016-04-27: 650 mg via ORAL

## 2016-04-27 MED ORDER — ACETAMINOPHEN 325 MG PO TABS
ORAL_TABLET | ORAL | Status: AC
  Filled 2016-04-27: qty 2

## 2016-04-27 MED ORDER — FAMOTIDINE IN NACL 20-0.9 MG/50ML-% IV SOLN
20.0000 mg | Freq: Once | INTRAVENOUS | Status: AC
  Administered 2016-04-27: 20 mg via INTRAVENOUS

## 2016-04-27 MED ORDER — SODIUM CHLORIDE 0.9 % IV SOLN
Freq: Once | INTRAVENOUS | Status: AC
  Administered 2016-04-27: 09:00:00 via INTRAVENOUS

## 2016-04-27 MED ORDER — DIPHENHYDRAMINE HCL 50 MG/ML IJ SOLN
INTRAMUSCULAR | Status: AC
  Filled 2016-04-27: qty 1

## 2016-04-27 MED ORDER — TRASTUZUMAB CHEMO INJECTION 440 MG
2.0000 mg/kg | Freq: Once | INTRAVENOUS | Status: AC
  Administered 2016-04-27: 231 mg via INTRAVENOUS
  Filled 2016-04-27: qty 11

## 2016-04-27 MED ORDER — FAMOTIDINE IN NACL 20-0.9 MG/50ML-% IV SOLN
INTRAVENOUS | Status: AC
  Filled 2016-04-27: qty 50

## 2016-04-27 MED ORDER — DEXAMETHASONE SODIUM PHOSPHATE 100 MG/10ML IJ SOLN
10.0000 mg | Freq: Once | INTRAMUSCULAR | Status: AC
  Administered 2016-04-27: 10 mg via INTRAVENOUS
  Filled 2016-04-27: qty 1

## 2016-04-27 MED ORDER — DIPHENHYDRAMINE HCL 50 MG/ML IJ SOLN
25.0000 mg | Freq: Once | INTRAMUSCULAR | Status: AC
  Administered 2016-04-27: 25 mg via INTRAVENOUS

## 2016-04-27 MED ORDER — HEPARIN SOD (PORK) LOCK FLUSH 100 UNIT/ML IV SOLN
500.0000 [IU] | Freq: Once | INTRAVENOUS | Status: AC | PRN
  Administered 2016-04-27: 500 [IU]
  Filled 2016-04-27: qty 5

## 2016-04-27 MED ORDER — SODIUM CHLORIDE 0.9 % IV SOLN
80.0000 mg/m2 | Freq: Once | INTRAVENOUS | Status: AC
  Administered 2016-04-27: 186 mg via INTRAVENOUS
  Filled 2016-04-27: qty 31

## 2016-04-27 MED ORDER — SODIUM CHLORIDE 0.9% FLUSH
10.0000 mL | INTRAVENOUS | Status: DC | PRN
  Administered 2016-04-27: 10 mL
  Filled 2016-04-27: qty 10

## 2016-04-27 NOTE — Telephone Encounter (Signed)
appt made and avs printed °

## 2016-04-27 NOTE — Patient Instructions (Signed)
[facility] Discharge Instructions for Patients Receiving Chemotherapy  Today you received the following chemotherapy agents Paclitaxel/Herceptin.   To help prevent nausea and vomiting after your treatment, we encourage you to take your nausea medication as directed.    If you develop nausea and vomiting that is not controlled by your nausea medication, call the clinic.   BELOW ARE SYMPTOMS THAT SHOULD BE REPORTED IMMEDIATELY:  *FEVER GREATER THAN 100.5 F  *CHILLS WITH OR WITHOUT FEVER  NAUSEA AND VOMITING THAT IS NOT CONTROLLED WITH YOUR NAUSEA MEDICATION  *UNUSUAL SHORTNESS OF BREATH  *UNUSUAL BRUISING OR BLEEDING  TENDERNESS IN MOUTH AND THROAT WITH OR WITHOUT PRESENCE OF ULCERS  *URINARY PROBLEMS  *BOWEL PROBLEMS  UNUSUAL RASH Items with * indicate a potential emergency and should be followed up as soon as possible.  Feel free to call the clinic you have any questions or concerns. The clinic phone number is [phone].  Please show the [place] at check-in to the Emergency Department and triage nurse.  Paclitaxel injection What is this medicine? PACLITAXEL (PAK li TAX el) is a chemotherapy drug. It targets fast dividing cells, like cancer cells, and causes these cells to die. This medicine is used to treat ovarian cancer, breast cancer, and other cancers. This medicine may be used for other purposes; ask your health care provider or pharmacist if you have questions. What should I tell my health care provider before I take this medicine? They need to know if you have any of these conditions: -blood disorders -irregular heartbeat -infection (especially a virus infection such as chickenpox, cold sores, or herpes) -liver disease -previous or ongoing radiation therapy -an unusual or allergic reaction to paclitaxel, alcohol, polyoxyethylated castor oil, other chemotherapy agents, other medicines, foods, dyes, or preservatives -pregnant or trying  to get pregnant -breast-feeding How should I use this medicine? This drug is given as an infusion into a vein. It is administered in a hospital or clinic by a specially trained health care professional. Talk to your pediatrician regarding the use of this medicine in children. Special care may be needed. Overdosage: If you think you have taken too much of this medicine contact a poison control center or emergency room at once. NOTE: This medicine is only for you. Do not share this medicine with others. What if I miss a dose? It is important not to miss your dose. Call your doctor or health care professional if you are unable to keep an appointment. What may interact with this medicine? Do not take this medicine with any of the following medications: -disulfiram -metronidazole This medicine may also interact with the following medications: -cyclosporine -diazepam -ketoconazole -medicines to increase blood counts like filgrastim, pegfilgrastim, sargramostim -other chemotherapy drugs like cisplatin, doxorubicin, epirubicin, etoposide, teniposide, vincristine -quinidine -testosterone -vaccines -verapamil Talk to your doctor or health care professional before taking any of these medicines: -acetaminophen -aspirin -ibuprofen -ketoprofen -naproxen This list may not describe all possible interactions. Give your health care provider a list of all the medicines, herbs, non-prescription drugs, or dietary supplements you use. Also tell them if you smoke, drink alcohol, or use illegal drugs. Some items may interact with your medicine. What should I watch for while using this medicine? Your condition will be monitored carefully while you are receiving this medicine. You will need important blood work done while you are taking this medicine. This drug may make you feel generally unwell. This is not uncommon, as chemotherapy can affect healthy cells as well  healthy cells as well as cancer cells. Report any side  effects. Continue your course of treatment even though you feel ill unless your doctor tells you to stop. This medicine can cause serious allergic reactions. To reduce your risk you will need to take other medicine(s) before treatment with this medicine. In some cases, you may be given additional medicines to help with side effects. Follow all directions for their use. Call your doctor or health care professional for advice if you get a fever, chills or sore throat, or other symptoms of a cold or flu. Do not treat yourself. This drug decreases your body's ability to fight infections. Try to avoid being around people who are sick. This medicine may increase your risk to bruise or bleed. Call your doctor or health care professional if you notice any unusual bleeding. Be careful brushing and flossing your teeth or using a toothpick because you may get an infection or bleed more easily. If you have any dental work done, tell your dentist you are receiving this medicine. Avoid taking products that contain aspirin, acetaminophen, ibuprofen, naproxen, or ketoprofen unless instructed by your doctor. These medicines may hide a fever. Do not become pregnant while taking this medicine. Women should inform their doctor if they wish to become pregnant or think they might be pregnant. There is a potential for serious side effects to an unborn child. Talk to your health care professional or pharmacist for more information. Do not breast-feed an infant while taking this medicine. Men are advised not to father a child while receiving this medicine. This product may contain alcohol. Ask your pharmacist or healthcare provider if this medicine contains alcohol. Be sure to tell all healthcare providers you are taking this medicine. Certain medicines, like metronidazole and disulfiram, can cause an unpleasant reaction when taken with alcohol. The reaction includes flushing, headache, nausea, vomiting, sweating, and increased  thirst. The reaction can last from 30 minutes to several hours. What side effects may I notice from receiving this medicine? Side effects that you should report to your doctor or health care professional as soon as possible: -allergic reactions like skin rash, itching or hives, swelling of the face, lips, or tongue -low blood counts - This drug may decrease the number of white blood cells, red blood cells and platelets. You may be at increased risk for infections and bleeding. -signs of infection - fever or chills, cough, sore throat, pain or difficulty passing urine -signs of decreased platelets or bleeding - bruising, pinpoint red spots on the skin, black, tarry stools, nosebleeds -signs of decreased red blood cells - unusually weak or tired, fainting spells, lightheadedness -breathing problems -chest pain -high or low blood pressure -mouth sores -nausea and vomiting -pain, swelling, redness or irritation at the injection site -pain, tingling, numbness in the hands or feet -slow or irregular heartbeat -swelling of the ankle, feet, hands Side effects that usually do not require medical attention (report to your doctor or health care professional if they continue or are bothersome): -bone pain -complete hair loss including hair on your head, underarms, pubic hair, eyebrows, and eyelashes -changes in the color of fingernails -diarrhea -loosening of the fingernails -loss of appetite -muscle or joint pain -red flush to skin -sweating This list may not describe all possible side effects. Call your doctor for medical advice about side effects. You may report side effects to FDA at 1-800-FDA-1088. Where should I keep my medicine? This drug is given in a hospital or clinic and will   home. NOTE: This sheet is a summary. It may not cover all possible information. If you have questions about this medicine, talk to your doctor, pharmacist, or health care provider.    2016, [employer]/Gold  Standard. (2015-06-18 13:02:56)   Trastuzumab injection for infusion What is this medicine? TRASTUZUMAB (tras TOO zoo mab) is a monoclonal antibody. It is used to treat breast cancer and stomach cancer. This medicine may be used for other purposes; ask your health care provider or pharmacist if you have questions. What should I tell my health care provider before I take this medicine? They need to know if you have any of these conditions: -heart disease -heart failure -infection (especially a virus infection such as chickenpox, cold sores, or herpes) -lung or breathing disease, like asthma -recent or ongoing radiation therapy -an unusual or allergic reaction to trastuzumab, benzyl alcohol, or other medications, foods, dyes, or preservatives -pregnant or trying to get pregnant -breast-feeding How should I use this medicine? This drug is given as an infusion into a vein. It is administered in a hospital or clinic by a specially trained health care professional. Talk to your pediatrician regarding the use of this medicine in children. This medicine is not approved for use in children. Overdosage: If you think you have taken too much of this medicine contact a poison control center or emergency room at once. NOTE: This medicine is only for you. Do not share this medicine with others. What if I miss a dose? It is important not to miss a dose. Call your doctor or health care professional if you are unable to keep an appointment. What may interact with this medicine? -doxorubicin -warfarin This list may not describe all possible interactions. Give your health care provider a list of all the medicines, herbs, non-prescription drugs, or dietary supplements you use. Also tell them if you smoke, drink alcohol, or use illegal drugs. Some items may interact with your medicine. What should I watch for while using this medicine? Visit your doctor for checks on your progress. Report any side effects.  Continue your course of treatment even though you feel ill unless your doctor tells you to stop. Call your doctor or health care professional for advice if you get a fever, chills or sore throat, or other symptoms of a cold or flu. Do not treat yourself. Try to avoid being around people who are sick. You may experience fever, chills and shaking during your first infusion. These effects are usually mild and can be treated with other medicines. Report any side effects during the infusion to your health care professional. Fever and chills usually do not happen with later infusions. Do not become pregnant while taking this medicine or for 7 months after stopping it. Women should inform their doctor if they wish to become pregnant or think they might be pregnant. Women of child-bearing potential will need to have a negative pregnancy test before starting this medicine. There is a potential for serious side effects to an unborn child. Talk to your health care professional or pharmacist for more information. Do not breast-feed an infant while taking this medicine or for 7 months after stopping it. Women must use effective birth control with this medicine. What side effects may I notice from receiving this medicine? Side effects that you should report to your doctor or other health care professional as soon as possible: -breathing difficulties -chest pain or palpitations -cough -dizziness or fainting -fever or chills, sore throat -skin rash, itching or hives -swelling  of the legs or ankles -unusually weak or tired Side effects that usually do not require medical attention (report to your doctor or other health care professional if they continue or are bothersome): -loss of appetite -headache -muscle aches -nausea This list may not describe all possible side effects. Call your doctor for medical advice about side effects. You may report side effects to FDA at 1-800-FDA-1088. Where should I keep my  medicine? This drug is given in a hospital or clinic and will not be stored at home. NOTE: This sheet is a summary. It may not cover all possible information. If you have questions about this medicine, talk to your doctor, pharmacist, or health care provider.    2016, Elsevier/Gold Standard. (2015-02-06 11:49:32)

## 2016-04-27 NOTE — Assessment & Plan Note (Signed)
Right lumpectomy 03/01/2016: ILC, grade 2, 0.8 cm, ALH, additional superior margin : ILC grade to 0.3 cm , positive superior margin , 0/1 LN neg, mammoplasty bil : benign, ER 95%, PR 95%, HER-2 positive on primary tumor and negative on secondary mass which had a ratio of 1.55, gene copy number is 2.25, Ki-67 5%, T1bN0 stage IA  HER-2: I discussed with the patient that the HER-2 was positive in the original tumor and the HER-2 on the second excision was negative. I was able to get this clarification from pathology. So the treatment plan is once again changed. Reexcision of superior margin: 5 2017: Benign  Treatment plan: 1. Adjuvant therapy with Taxol and Herceptin weekly 12 followed by Herceptin maintenance every 3 weeks 1 year 2. Follow-up adjuvant radiation followed by 3. Antiestrogen therapy ----------------------------------------------------------------------------------------------------------------------------- Current treatment: Cycle 2 day 1 Taxol Herceptin Echocardiogram 03/24/2016: EF 60-65%  Chemotherapy toxicities:  Labs were reviewed Return to clinic in 2 weeks for cycle 4 and weekly for chemotherapy

## 2016-04-27 NOTE — Progress Notes (Signed)
Patient Care Team: [name], MD as PCP - General (Obstetrics and Gynecology)  SUMMARY OF ONCOLOGIC HISTORY:   Breast cancer of upper-inner quadrant of right female breast ([place])   01/21/2016 Initial Diagnosis Right breast biopsy: Invasive lobular cancer, ER 95%, PR 95%, Ki-67 5%, HER-2 positive ratio 2.52   02/01/2016 Breast MRI Right breast: Post biopsy hematoma measuring 1.9 x 1.3 x 2.5 cm surrounding ring of reactive enhancement, no lymphadenopathy. T2 N0 stage II a clinical stage   03/01/2016 Surgery Right lumpectomy: ILC, grade 2, 0.8 cm, ALH, additional superior margin : ILC grade to 0.3 cm , positive superior margin , 0/1 LN neg, mammoplasty bil : benign, ER 95%, PR 95%, HER-2 pos, Ki-67 5%, T1bN0 stage IA   03/15/2016 Surgery Right breast. Margin excision: Benign   04/20/2016 -  Chemotherapy Taxol Herceptin weekly 12 followed by Herceptin maintenance for 1 year    CHIEF COMPLIANT: Taxol Herceptin cycle 2  INTERVAL HISTORY: King King a 50 year old with above-mentioned history of right breast cancer currently on adjuvant chemotherapy with Taxol Herceptin. Today King cycle 2 of treatment. She has tolerated cycle one extremely well. Did not have any nausea or vomiting. She felt flushing of the face for a couple of days but that resolved. She had a migraine related to having it. But she does not attribute it to chemotherapy. Did not have any mouth sores or diarrhea or constipation.  REVIEW OF SYSTEMS:   Constitutional: Denies fevers, chills or abnormal weight loss Eyes: Denies blurriness of vision Ears, nose, mouth, throat, and face: Denies mucositis or sore throat Respiratory: Denies cough, dyspnea or wheezes Cardiovascular: Denies palpitation, chest discomfort Gastrointestinal:  Denies nausea, heartburn or change in bowel habits Skin: Denies abnormal skin rashes Lymphatics: Denies new lymphadenopathy or easy bruising Neurological:Denies numbness, tingling or new  weaknesses Behavioral/Psych: Mood King stable, no new changes  Extremities: No lower extremity edema Breast:  Healing very well from prior surgeries All other systems were reviewed with the patient and are negative.  I have reviewed the past medical history, past surgical history, social history and family history with the patient and they are unchanged from previous note.  ALLERGIES:  King allergic to penicillin g.  MEDICATIONS:  Current Outpatient Prescriptions  Medication Sig Dispense Refill  . lidocaine-prilocaine (EMLA) cream Apply to affected area once 30 g 3  . LORazepam (ATIVAN) 0.5 MG tablet Take 1 tablet (0.5 mg total) by mouth at bedtime. 30 tablet 0  . ondansetron (ZOFRAN) 8 MG tablet Take 1 tablet (8 mg total) by mouth 2 (two) times daily as needed (Nausea or vomiting). 30 tablet 1  . prochlorperazine (COMPAZINE) 10 MG tablet Take 1 tablet (10 mg total) by mouth every 6 (six) hours as needed (Nausea or vomiting). 30 tablet 1  . SYNTHROID 112 MCG tablet   0  . Vitamin D, Ergocalciferol, (DRISDOL) 50000 units CAPS capsule Take 50,000 Units by mouth every 7 (seven) days.     No current facility-administered medications for this visit.    PHYSICAL EXAMINATION: ECOG PERFORMANCE STATUS: 1 - Symptomatic but completely ambulatory  Filed Vitals:   04/27/16 0828  BP: 129/79  Pulse: 94  Temp: 98.3 F (36.8 C)  Resp: 18   Filed Weights   04/27/16 0828  Weight: 250 lb 6.4 oz (113.581 kg)    GENERAL:alert, no distress and comfortable SKIN: skin color, texture, turgor are normal, no rashes or significant lesions EYES: normal, Conjunctiva are pink and non-injected, sclera clear OROPHARYNX:no  exudate, no erythema and lips, buccal mucosa, and tongue normal  NECK: supple, thyroid normal size, non-tender, without nodularity LYMPH:  no palpable lymphadenopathy in the cervical, axillary or inguinal LUNGS: clear to auscultation and percussion with normal breathing effort HEART:  regular rate & rhythm and no murmurs and no lower extremity edema ABDOMEN:abdomen soft, non-tender and normal bowel sounds MUSCULOSKELETAL:no cyanosis of digits and no clubbing  NEURO: alert & oriented x 3 with fluent speech, no focal motor/sensory deficits EXTREMITIES: No lower extremity edema  LABORATORY DATA:  I have reviewed the data as listed   Chemistry      Component Value Date/Time   NA 141 04/20/2016 0817   NA 141 04/17/2007 0925   K 3.7 04/20/2016 0817   K 4.1 04/17/2007 0925   CL 111 04/17/2007 0925   CO2 21* 04/20/2016 0817   CO2 26 04/17/2007 0925   BUN 12.9 04/20/2016 0817   BUN 12 04/17/2007 0925   CREATININE 1.0 04/20/2016 0817   CREATININE 0.8 04/17/2007 0925      Component Value Date/Time   CALCIUM 9.0 04/20/2016 0817   CALCIUM 9.1 04/17/2007 0925   ALKPHOS 74 04/20/2016 0817   AST 14 04/20/2016 0817   ALT 13 04/20/2016 0817   BILITOT 0.39 04/20/2016 0817       Lab Results  Component Value Date   WBC 7.2 04/27/2016   HGB 12.0 04/27/2016   HCT 36.5 04/27/2016   MCV 86.5 04/27/2016   PLT 219 04/27/2016   NEUTROABS 4.6 04/27/2016     ASSESSMENT & PLAN:  Breast cancer of upper-inner quadrant of right female breast ([place]) Right lumpectomy 03/01/2016: ILC, grade 2, 0.8 cm, ALH, additional superior margin : ILC grade to 0.3 cm , positive superior margin , 0/1 LN neg, mammoplasty bil : benign, ER 95%, PR 95%, HER-2 positive on primary tumor and negative on secondary mass which had a ratio of 1.55, gene copy number King 2.25, Ki-67 5%, T1bN0 stage IA  HER-2: I discussed with the patient that the HER-2 was positive in the original tumor and the HER-2 on the second excision was negative. I was able to get this clarification from pathology. So the treatment plan King once again changed. Reexcision of superior margin: 5 2017: Benign  Treatment plan: 1. Adjuvant therapy with Taxol and Herceptin weekly 12 followed by Herceptin maintenance every 3 weeks 1 year 2.  Follow-up adjuvant radiation followed by 3. Antiestrogen therapy ----------------------------------------------------------------------------------------------------------------------------- Current treatment: Cycle 2 day 1 Taxol Herceptin Echocardiogram 03/24/2016: EF 60-65%  Chemotherapy toxicities: 1. Flushing sensation  Labs were reviewed Return to clinic in 2 weeks for cycle 4 and weekly for chemotherapy   No orders of the defined types were placed in this encounter.   The patient has a good understanding of the overall plan. she agrees with it. she will call with any problems that may develop before the next visit here.   Rulon Eisenmenger, MD 04/27/2016

## 2016-05-03 ENCOUNTER — Other Ambulatory Visit: Payer: Self-pay

## 2016-05-03 DIAGNOSIS — C50211 Malignant neoplasm of upper-inner quadrant of right female breast: Secondary | ICD-10-CM

## 2016-05-04 ENCOUNTER — Other Ambulatory Visit (HOSPITAL_BASED_OUTPATIENT_CLINIC_OR_DEPARTMENT_OTHER): Payer: Commercial Managed Care - PPO

## 2016-05-04 ENCOUNTER — Ambulatory Visit (HOSPITAL_BASED_OUTPATIENT_CLINIC_OR_DEPARTMENT_OTHER): Payer: Commercial Managed Care - PPO

## 2016-05-04 VITALS — BP 143/77 | HR 72 | Temp 98.6°F | Resp 16

## 2016-05-04 DIAGNOSIS — Z5111 Encounter for antineoplastic chemotherapy: Secondary | ICD-10-CM | POA: Diagnosis not present

## 2016-05-04 DIAGNOSIS — C50211 Malignant neoplasm of upper-inner quadrant of right female breast: Secondary | ICD-10-CM | POA: Diagnosis not present

## 2016-05-04 LAB — COMPREHENSIVE METABOLIC PANEL
ALBUMIN: 3.2 g/dL — AB (ref 3.5–5.0)
ALK PHOS: 69 U/L (ref 40–150)
ALT: 16 U/L (ref 0–55)
ANION GAP: 8 meq/L (ref 3–11)
AST: 12 U/L (ref 5–34)
BUN: 13.5 mg/dL (ref 7.0–26.0)
CALCIUM: 8.9 mg/dL (ref 8.4–10.4)
CHLORIDE: 110 meq/L — AB (ref 98–109)
CO2: 22 mEq/L (ref 22–29)
CREATININE: 0.9 mg/dL (ref 0.6–1.1)
EGFR: 74 mL/min/{1.73_m2} — ABNORMAL LOW (ref >90)
Glucose: 125 mg/dl (ref 70–140)
POTASSIUM: 4 meq/L (ref 3.5–5.1)
Sodium: 140 mEq/L (ref 136–145)
Total Bilirubin: 0.39 mg/dL (ref 0.20–1.20)
Total Protein: 6.9 g/dL (ref 6.4–8.3)

## 2016-05-04 LAB — CBC WITH DIFFERENTIAL/PLATELET
BASO%: 0.6 % (ref 0.0–2.0)
BASOS ABS: 0 10*3/uL (ref 0.0–0.1)
EOS%: 6.7 % (ref 0.0–7.0)
Eosinophils Absolute: 0.4 10*3/uL (ref 0.0–0.5)
HEMATOCRIT: 35.1 % (ref 34.8–46.6)
HGB: 11.3 g/dL — ABNORMAL LOW (ref 11.6–15.9)
LYMPH#: 2.2 10*3/uL (ref 0.9–3.3)
LYMPH%: 33 % (ref 14.0–49.7)
MCH: 27.9 pg (ref 25.1–34.0)
MCHC: 32.2 g/dL (ref 31.5–36.0)
MCV: 86.7 fL (ref 79.5–101.0)
MONO#: 0.4 10*3/uL (ref 0.1–0.9)
MONO%: 5.4 % (ref 0.0–14.0)
NEUT#: 3.5 10*3/uL (ref 1.5–6.5)
NEUT%: 54.3 % (ref 38.4–76.8)
PLATELETS: 257 10*3/uL (ref 145–400)
RBC: 4.05 10*6/uL (ref 3.70–5.45)
RDW: 13.4 % (ref 11.2–14.5)
WBC: 6.5 10*3/uL (ref 3.9–10.3)

## 2016-05-04 MED ORDER — DIPHENHYDRAMINE HCL 50 MG/ML IJ SOLN
INTRAMUSCULAR | Status: AC
  Filled 2016-05-04: qty 1

## 2016-05-04 MED ORDER — SODIUM CHLORIDE 0.9 % IV SOLN: Freq: Once | INTRAVENOUS | Status: DC

## 2016-05-04 MED ORDER — DIPHENHYDRAMINE HCL 50 MG/ML IJ SOLN
25.0000 mg | Freq: Once | INTRAMUSCULAR | Status: AC
  Administered 2016-05-04: 25 mg via INTRAVENOUS

## 2016-05-04 MED ORDER — HEPARIN SOD (PORK) LOCK FLUSH 100 UNIT/ML IV SOLN
500.0000 [IU] | Freq: Once | INTRAVENOUS | Status: AC | PRN
  Administered 2016-05-04: 500 [IU]
  Filled 2016-05-04: qty 5

## 2016-05-04 MED ORDER — ACETAMINOPHEN 325 MG PO TABS
ORAL_TABLET | ORAL | Status: AC
  Filled 2016-05-04: qty 2

## 2016-05-04 MED ORDER — TRASTUZUMAB CHEMO INJECTION 440 MG
2.0000 mg/kg | Freq: Once | INTRAVENOUS | Status: AC
  Administered 2016-05-04: 231 mg via INTRAVENOUS
  Filled 2016-05-04: qty 11

## 2016-05-04 MED ORDER — ACETAMINOPHEN 325 MG PO TABS
650.0000 mg | ORAL_TABLET | Freq: Once | ORAL | Status: AC
  Administered 2016-05-04: 650 mg via ORAL

## 2016-05-04 MED ORDER — FAMOTIDINE IN NACL 20-0.9 MG/50ML-% IV SOLN
20.0000 mg | Freq: Once | INTRAVENOUS | Status: AC
  Administered 2016-05-04: 20 mg via INTRAVENOUS

## 2016-05-04 MED ORDER — FAMOTIDINE IN NACL 20-0.9 MG/50ML-% IV SOLN
INTRAVENOUS | Status: AC
  Filled 2016-05-04: qty 50

## 2016-05-04 MED ORDER — SODIUM CHLORIDE 0.9% FLUSH
10.0000 mL | INTRAVENOUS | Status: DC | PRN
  Administered 2016-05-04: 10 mL
  Filled 2016-05-04: qty 10

## 2016-05-04 MED ORDER — SODIUM CHLORIDE 0.9 % IV SOLN
Freq: Once | INTRAVENOUS | Status: AC
  Administered 2016-05-04: 09:00:00 via INTRAVENOUS

## 2016-05-04 MED ORDER — SODIUM CHLORIDE 0.9 % IV SOLN
10.0000 mg | Freq: Once | INTRAVENOUS | Status: AC
  Administered 2016-05-04: 10 mg via INTRAVENOUS
  Filled 2016-05-04: qty 1

## 2016-05-04 MED ORDER — SODIUM CHLORIDE 0.9 % IV SOLN
80.0000 mg/m2 | Freq: Once | INTRAVENOUS | Status: AC
  Administered 2016-05-04: 186 mg via INTRAVENOUS
  Filled 2016-05-04: qty 31

## 2016-05-04 NOTE — Patient Instructions (Signed)
[facility] Discharge Instructions for Patients Receiving Chemotherapy  Today you received the following chemotherapy agents: Taxol and Herceptin.   To help prevent nausea and vomiting after your treatment, we encourage you to take your nausea medication as directed.    If you develop nausea and vomiting that is not controlled by your nausea medication, call the clinic.   BELOW ARE SYMPTOMS THAT SHOULD BE REPORTED IMMEDIATELY:  *FEVER GREATER THAN 100.5 F  *CHILLS WITH OR WITHOUT FEVER  NAUSEA AND VOMITING THAT IS NOT CONTROLLED WITH YOUR NAUSEA MEDICATION  *UNUSUAL SHORTNESS OF BREATH  *UNUSUAL BRUISING OR BLEEDING  TENDERNESS IN MOUTH AND THROAT WITH OR WITHOUT PRESENCE OF ULCERS  *URINARY PROBLEMS  *BOWEL PROBLEMS  UNUSUAL RASH Items with * indicate a potential emergency and should be followed up as soon as possible.  Feel free to call the clinic you have any questions or concerns. The clinic phone number is [phone].  Please show the CHEMO ALERT CARD at check-in to the Emergency Department and triage nurse.   

## 2016-05-11 ENCOUNTER — Encounter: Payer: Self-pay | Admitting: Hematology and Oncology

## 2016-05-11 ENCOUNTER — Ambulatory Visit (HOSPITAL_BASED_OUTPATIENT_CLINIC_OR_DEPARTMENT_OTHER): Payer: Commercial Managed Care - PPO | Admitting: Hematology and Oncology

## 2016-05-11 ENCOUNTER — Telehealth: Payer: Self-pay | Admitting: Hematology and Oncology

## 2016-05-11 ENCOUNTER — Other Ambulatory Visit (HOSPITAL_BASED_OUTPATIENT_CLINIC_OR_DEPARTMENT_OTHER): Payer: Commercial Managed Care - PPO

## 2016-05-11 ENCOUNTER — Ambulatory Visit (HOSPITAL_BASED_OUTPATIENT_CLINIC_OR_DEPARTMENT_OTHER): Payer: Commercial Managed Care - PPO

## 2016-05-11 VITALS — BP 141/82 | HR 90 | Temp 98.5°F | Resp 18 | Ht 66.0 in | Wt 249.2 lb

## 2016-05-11 DIAGNOSIS — L659 Nonscarring hair loss, unspecified: Secondary | ICD-10-CM

## 2016-05-11 DIAGNOSIS — Z5112 Encounter for antineoplastic immunotherapy: Secondary | ICD-10-CM | POA: Diagnosis not present

## 2016-05-11 DIAGNOSIS — C50211 Malignant neoplasm of upper-inner quadrant of right female breast: Secondary | ICD-10-CM

## 2016-05-11 DIAGNOSIS — Z5111 Encounter for antineoplastic chemotherapy: Secondary | ICD-10-CM

## 2016-05-11 LAB — COMPREHENSIVE METABOLIC PANEL
ALT: 21 U/L (ref 0–55)
ANION GAP: 11 meq/L (ref 3–11)
AST: 16 U/L (ref 5–34)
Albumin: 3.4 g/dL — ABNORMAL LOW (ref 3.5–5.0)
Alkaline Phosphatase: 82 U/L (ref 40–150)
BUN: 14.6 mg/dL (ref 7.0–26.0)
CHLORIDE: 108 meq/L (ref 98–109)
CO2: 22 meq/L (ref 22–29)
CREATININE: 1 mg/dL (ref 0.6–1.1)
Calcium: 9.2 mg/dL (ref 8.4–10.4)
EGFR: 69 mL/min/{1.73_m2} — AB (ref >90)
Glucose: 146 mg/dl — ABNORMAL HIGH (ref 70–140)
POTASSIUM: 3.9 meq/L (ref 3.5–5.1)
Sodium: 141 mEq/L (ref 136–145)
Total Bilirubin: 0.34 mg/dL (ref 0.20–1.20)
Total Protein: 7.4 g/dL (ref 6.4–8.3)

## 2016-05-11 LAB — CBC WITH DIFFERENTIAL/PLATELET
BASO%: 0.6 % (ref 0.0–2.0)
BASOS ABS: 0 10*3/uL (ref 0.0–0.1)
EOS%: 4.4 % (ref 0.0–7.0)
Eosinophils Absolute: 0.3 10*3/uL (ref 0.0–0.5)
HCT: 36.4 % (ref 34.8–46.6)
HGB: 11.9 g/dL (ref 11.6–15.9)
LYMPH%: 32.2 % (ref 14.0–49.7)
MCH: 28.4 pg (ref 25.1–34.0)
MCHC: 32.7 g/dL (ref 31.5–36.0)
MCV: 86.9 fL (ref 79.5–101.0)
MONO#: 0.4 10*3/uL (ref 0.1–0.9)
MONO%: 5.7 % (ref 0.0–14.0)
NEUT#: 3.8 10*3/uL (ref 1.5–6.5)
NEUT%: 57.1 % (ref 38.4–76.8)
PLATELETS: 263 10*3/uL (ref 145–400)
RBC: 4.19 10*6/uL (ref 3.70–5.45)
RDW: 13.6 % (ref 11.2–14.5)
WBC: 6.6 10*3/uL (ref 3.9–10.3)
lymph#: 2.1 10*3/uL (ref 0.9–3.3)

## 2016-05-11 MED ORDER — SODIUM CHLORIDE 0.9% FLUSH
10.0000 mL | INTRAVENOUS | Status: DC | PRN
  Administered 2016-05-11: 10 mL
  Filled 2016-05-11: qty 10

## 2016-05-11 MED ORDER — SODIUM CHLORIDE 0.9 % IV SOLN
2.0000 mg/kg | Freq: Once | INTRAVENOUS | Status: AC
  Administered 2016-05-11: 231 mg via INTRAVENOUS
  Filled 2016-05-11: qty 11

## 2016-05-11 MED ORDER — SODIUM CHLORIDE 0.9 % IV SOLN
10.0000 mg | Freq: Once | INTRAVENOUS | Status: AC
  Administered 2016-05-11: 10 mg via INTRAVENOUS
  Filled 2016-05-11: qty 1

## 2016-05-11 MED ORDER — FAMOTIDINE IN NACL 20-0.9 MG/50ML-% IV SOLN
INTRAVENOUS | Status: AC
  Filled 2016-05-11: qty 50

## 2016-05-11 MED ORDER — HEPARIN SOD (PORK) LOCK FLUSH 100 UNIT/ML IV SOLN
500.0000 [IU] | Freq: Once | INTRAVENOUS | Status: AC | PRN
  Administered 2016-05-11: 500 [IU]
  Filled 2016-05-11: qty 5

## 2016-05-11 MED ORDER — PACLITAXEL CHEMO INJECTION 300 MG/50ML
80.0000 mg/m2 | Freq: Once | INTRAVENOUS | Status: AC
  Administered 2016-05-11: 186 mg via INTRAVENOUS
  Filled 2016-05-11: qty 31

## 2016-05-11 MED ORDER — ACETAMINOPHEN 325 MG PO TABS
650.0000 mg | ORAL_TABLET | Freq: Once | ORAL | Status: AC
  Administered 2016-05-11: 650 mg via ORAL

## 2016-05-11 MED ORDER — DIPHENHYDRAMINE HCL 50 MG/ML IJ SOLN
INTRAMUSCULAR | Status: AC
  Filled 2016-05-11: qty 1

## 2016-05-11 MED ORDER — DIPHENHYDRAMINE HCL 50 MG/ML IJ SOLN
25.0000 mg | Freq: Once | INTRAMUSCULAR | Status: AC
  Administered 2016-05-11: 25 mg via INTRAVENOUS

## 2016-05-11 MED ORDER — ACETAMINOPHEN 325 MG PO TABS
ORAL_TABLET | ORAL | Status: AC
  Filled 2016-05-11: qty 2

## 2016-05-11 MED ORDER — FAMOTIDINE IN NACL 20-0.9 MG/50ML-% IV SOLN
20.0000 mg | Freq: Once | INTRAVENOUS | Status: AC
  Administered 2016-05-11: 20 mg via INTRAVENOUS

## 2016-05-11 MED ORDER — SODIUM CHLORIDE 0.9 % IV SOLN
Freq: Once | INTRAVENOUS | Status: AC
  Administered 2016-05-11: 09:00:00 via INTRAVENOUS

## 2016-05-11 NOTE — Assessment & Plan Note (Signed)
Right lumpectomy 03/01/2016: ILC, grade 2, 0.8 cm, ALH, additional superior margin : ILC grade to 0.3 cm , positive superior margin , 0/1 LN neg, mammoplasty bil : benign, ER 95%, PR 95%, HER-2 positive on primary tumor and negative on secondary mass which had a ratio of 1.55, gene copy number is 2.25, Ki-67 5%, T1bN0 stage IA  HER-2: I discussed with the patient that the HER-2 was positive in the original tumor and the HER-2 on the second excision was negative. I was able to get this clarification from pathology. So the treatment plan is once again changed. Reexcision of superior margin: 5 2017: Benign  Treatment plan: 1. Adjuvant therapy with Taxol and Herceptin weekly 12 followed by Herceptin maintenance every 3 weeks 1 year 2. Follow-up adjuvant radiation followed by 3. Antiestrogen therapy ----------------------------------------------------------------------------------------------------------------------------- Current treatment: Cycle 4 weekly Taxol Herceptin Echocardiogram 03/24/2016: EF 60-65%  Chemotherapy toxicities: 1. Flushing sensation  Labs were reviewed Return to clinic in 2 weeks for cycle 6 and weekly for chemotherapy

## 2016-05-11 NOTE — Telephone Encounter (Signed)
appt made and avs printed °

## 2016-05-11 NOTE — Progress Notes (Signed)
Patient Care Team: [name], MD as PCP - General (Obstetrics and Gynecology)  SUMMARY OF ONCOLOGIC HISTORY:   Breast cancer of upper-inner quadrant of right female breast ([place])   01/21/2016 Initial Diagnosis Right breast biopsy: Invasive lobular cancer, ER 95%, PR 95%, Ki-67 5%, HER-2 positive ratio 2.52   02/01/2016 Breast MRI Right breast: Post biopsy hematoma measuring 1.9 x 1.3 x 2.5 cm surrounding ring of reactive enhancement, no lymphadenopathy. T2 N0 stage II a clinical stage   03/01/2016 Surgery Right lumpectomy: ILC, grade 2, 0.8 cm, ALH, additional superior margin : ILC grade to 0.3 cm , positive superior margin , 0/1 LN neg, mammoplasty bil : benign, ER 95%, PR 95%, HER-2 pos, Ki-67 5%, T1bN0 stage IA   03/15/2016 Surgery Right breast. Margin excision: Benign   04/20/2016 -  Chemotherapy Taxol Herceptin weekly 12 followed by Herceptin maintenance for 1 year    CHIEF COMPLIANT: Week 3 Taxol and Herceptin  INTERVAL HISTORY: Janet King is a 50 year old with above-mentioned history of right breast cancer currently on adjuvant chemotherapy with Taxol and Herceptin. Today is week 3. She had tolerated first 2 weeks of treatment extremely well. She did not have any nausea or vomiting. She does have some scalp irritation as well as irritation the nose.  REVIEW OF SYSTEMS:   Constitutional: Denies fevers, chills or abnormal weight loss Eyes: Denies blurriness of vision Ears, nose, mouth, throat, and face: Denies mucositis or sore throat Respiratory: Denies cough, dyspnea or wheezes Cardiovascular: Denies palpitation, chest discomfort Gastrointestinal:  Denies nausea, heartburn or change in bowel habits Skin: Denies abnormal skin rashes Lymphatics: Denies new lymphadenopathy or easy bruising Neurological:Denies numbness, tingling or new weaknesses Behavioral/Psych: Mood is stable, no new changes  Extremities: No lower extremity edema Breast:  denies any pain or lumps or  nodules in either breasts All other systems were reviewed with the patient and are negative.  I have reviewed the past medical history, past surgical history, social history and family history with the patient and they are unchanged from previous note.  ALLERGIES:  is allergic to penicillin g.  MEDICATIONS:  Current Outpatient Prescriptions  Medication Sig Dispense Refill  . lidocaine-prilocaine (EMLA) cream Apply to affected area once 30 g 3  . LORazepam (ATIVAN) 0.5 MG tablet Take 1 tablet (0.5 mg total) by mouth at bedtime. 30 tablet 0  . ondansetron (ZOFRAN) 8 MG tablet Take 1 tablet (8 mg total) by mouth 2 (two) times daily as needed (Nausea or vomiting). 30 tablet 1  . prochlorperazine (COMPAZINE) 10 MG tablet Take 1 tablet (10 mg total) by mouth every 6 (six) hours as needed (Nausea or vomiting). 30 tablet 1  . SYNTHROID 112 MCG tablet   0  . Vitamin D, Ergocalciferol, (DRISDOL) 50000 units CAPS capsule Take 50,000 Units by mouth every 7 (seven) days.     No current facility-administered medications for this visit.    PHYSICAL EXAMINATION: ECOG PERFORMANCE STATUS: 1 - Symptomatic but completely ambulatory  Filed Vitals:   05/11/16 0836  BP: 141/82  Pulse: 90  Temp: 98.5 F (36.9 C)  Resp: 18   Filed Weights   05/11/16 0836  Weight: 249 lb 3.2 oz (113.036 kg)    GENERAL:alert, no distress and comfortable SKIN: skin color, texture, turgor are normal, no rashes or significant lesions EYES: normal, Conjunctiva are pink and non-injected, sclera clear OROPHARYNX:no exudate, no erythema and lips, buccal mucosa, and tongue normal  NECK: supple, thyroid normal size, non-tender, without nodularity LYMPH:  no palpable lymphadenopathy in the cervical, axillary or inguinal LUNGS: clear to auscultation and percussion with normal breathing effort HEART: regular rate & rhythm and no murmurs and no lower extremity edema ABDOMEN:abdomen soft, non-tender and normal bowel  sounds MUSCULOSKELETAL:no cyanosis of digits and no clubbing  NEURO: alert & oriented x 3 with fluent speech, no focal motor/sensory deficits EXTREMITIES: No lower extremity edema  LABORATORY DATA:  I have reviewed the data as listed   Chemistry      Component Value Date/Time   NA 141 05/11/2016 0807   NA 141 04/17/2007 0925   K 3.9 05/11/2016 0807   K 4.1 04/17/2007 0925   CL 111 04/17/2007 0925   CO2 22 05/11/2016 0807   CO2 26 04/17/2007 0925   BUN 14.6 05/11/2016 0807   BUN 12 04/17/2007 0925   CREATININE 1.0 05/11/2016 0807   CREATININE 0.8 04/17/2007 0925      Component Value Date/Time   CALCIUM 9.2 05/11/2016 0807   CALCIUM 9.1 04/17/2007 0925   ALKPHOS 82 05/11/2016 0807   AST 16 05/11/2016 0807   ALT 21 05/11/2016 0807   BILITOT 0.34 05/11/2016 0807       Lab Results  Component Value Date   WBC 6.6 05/11/2016   HGB 11.9 05/11/2016   HCT 36.4 05/11/2016   MCV 86.9 05/11/2016   PLT 263 05/11/2016   NEUTROABS 3.8 05/11/2016     ASSESSMENT & PLAN:  Breast cancer of upper-inner quadrant of right female breast ([place]) Right lumpectomy 03/01/2016: ILC, grade 2, 0.8 cm, ALH, additional superior margin : ILC grade to 0.3 cm , positive superior margin , 0/1 LN neg, mammoplasty bil : benign, ER 95%, PR 95%, HER-2 positive on primary tumor and negative on secondary mass which had a ratio of 1.55, gene copy number is 2.25, Ki-67 5%, T1bN0 stage IA  HER-2: I discussed with the patient that the HER-2 was positive in the original tumor and the HER-2 on the second excision was negative. I was able to get this clarification from pathology. So the treatment plan is once again changed. Reexcision of superior margin: 5 2017: Benign  Treatment plan: 1. Adjuvant therapy with Taxol and Herceptin weekly 12 followed by Herceptin maintenance every 3 weeks 1 year 2. Follow-up adjuvant radiation followed by 3. Antiestrogen  therapy ----------------------------------------------------------------------------------------------------------------------------- Current treatment: Cycle 4 weekly Taxol Herceptin Echocardiogram 03/24/2016: EF 60-65%  Chemotherapy toxicities: 1. Flushing sensation 2. Scalp irritation, mild hair loss  Labs were reviewed Return to clinic in 2 weeks for cycle 6 and weekly for chemotherapy   No orders of the defined types were placed in this encounter.   The patient has a good understanding of the overall plan. she agrees with it. she will call with any problems that may develop before the next visit here.   Rulon Eisenmenger, MD 05/11/2016

## 2016-05-11 NOTE — Patient Instructions (Signed)
[facility] Discharge Instructions for Patients Receiving Chemotherapy  Today you received the following chemotherapy agents Herceptin/Taxol To help prevent nausea and vomiting after your treatment, we encourage you to take your nausea medication as prescribed.  If you develop nausea and vomiting that is not controlled by your nausea medication, call the clinic.   BELOW ARE SYMPTOMS THAT SHOULD BE REPORTED IMMEDIATELY:  *FEVER GREATER THAN 100.5 F  *CHILLS WITH OR WITHOUT FEVER  NAUSEA AND VOMITING THAT IS NOT CONTROLLED WITH YOUR NAUSEA MEDICATION  *UNUSUAL SHORTNESS OF BREATH  *UNUSUAL BRUISING OR BLEEDING  TENDERNESS IN MOUTH AND THROAT WITH OR WITHOUT PRESENCE OF ULCERS  *URINARY PROBLEMS  *BOWEL PROBLEMS  UNUSUAL RASH Items with * indicate a potential emergency and should be followed up as soon as possible.  Feel free to call the clinic you have any questions or concerns. The clinic phone number is [phone].  Please show the CHEMO ALERT CARD at check-in to the Emergency Department and triage nurse.   

## 2016-05-18 ENCOUNTER — Other Ambulatory Visit (HOSPITAL_BASED_OUTPATIENT_CLINIC_OR_DEPARTMENT_OTHER): Payer: Commercial Managed Care - PPO

## 2016-05-18 ENCOUNTER — Telehealth: Payer: Self-pay | Admitting: *Deleted

## 2016-05-18 ENCOUNTER — Ambulatory Visit (HOSPITAL_BASED_OUTPATIENT_CLINIC_OR_DEPARTMENT_OTHER): Payer: Commercial Managed Care - PPO

## 2016-05-18 VITALS — BP 143/91 | HR 81 | Temp 98.9°F | Resp 18

## 2016-05-18 DIAGNOSIS — C50211 Malignant neoplasm of upper-inner quadrant of right female breast: Secondary | ICD-10-CM | POA: Diagnosis not present

## 2016-05-18 DIAGNOSIS — Z5112 Encounter for antineoplastic immunotherapy: Secondary | ICD-10-CM

## 2016-05-18 DIAGNOSIS — Z5111 Encounter for antineoplastic chemotherapy: Secondary | ICD-10-CM | POA: Diagnosis not present

## 2016-05-18 LAB — COMPREHENSIVE METABOLIC PANEL
ALT: 17 U/L (ref 0–55)
AST: 15 U/L (ref 5–34)
Albumin: 3.5 g/dL (ref 3.5–5.0)
Alkaline Phosphatase: 75 U/L (ref 40–150)
Anion Gap: 8 mEq/L (ref 3–11)
BUN: 12 mg/dL (ref 7.0–26.0)
CALCIUM: 9.2 mg/dL (ref 8.4–10.4)
CHLORIDE: 110 meq/L — AB (ref 98–109)
CO2: 23 meq/L (ref 22–29)
CREATININE: 0.9 mg/dL (ref 0.6–1.1)
EGFR: 74 mL/min/{1.73_m2} — ABNORMAL LOW (ref >90)
GLUCOSE: 128 mg/dL (ref 70–140)
POTASSIUM: 4.3 meq/L (ref 3.5–5.1)
SODIUM: 141 meq/L (ref 136–145)
Total Bilirubin: <0.3 mg/dL (ref 0.20–1.20)
Total Protein: 7.1 g/dL (ref 6.4–8.3)

## 2016-05-18 LAB — CBC WITH DIFFERENTIAL/PLATELET
BASO%: 0.6 % (ref 0.0–2.0)
Basophils Absolute: 0 10*3/uL (ref 0.0–0.1)
EOS%: 4.2 % (ref 0.0–7.0)
Eosinophils Absolute: 0.3 10*3/uL (ref 0.0–0.5)
HEMATOCRIT: 35.3 % (ref 34.8–46.6)
HGB: 11.6 g/dL (ref 11.6–15.9)
LYMPH%: 30 % (ref 14.0–49.7)
MCH: 28 pg (ref 25.1–34.0)
MCHC: 32.9 g/dL (ref 31.5–36.0)
MCV: 85.3 fL (ref 79.5–101.0)
MONO#: 0.4 10*3/uL (ref 0.1–0.9)
MONO%: 6.2 % (ref 0.0–14.0)
NEUT%: 59 % (ref 38.4–76.8)
NEUTROS ABS: 4.1 10*3/uL (ref 1.5–6.5)
Platelets: 256 10*3/uL (ref 145–400)
RBC: 4.14 10*6/uL (ref 3.70–5.45)
RDW: 13.7 % (ref 11.2–14.5)
WBC: 6.9 10*3/uL (ref 3.9–10.3)
lymph#: 2.1 10*3/uL (ref 0.9–3.3)
nRBC: 0 % (ref 0–0)

## 2016-05-18 MED ORDER — DIPHENHYDRAMINE HCL 50 MG/ML IJ SOLN
25.0000 mg | Freq: Once | INTRAMUSCULAR | Status: AC
  Administered 2016-05-18: 25 mg via INTRAVENOUS

## 2016-05-18 MED ORDER — ACETAMINOPHEN 325 MG PO TABS
650.0000 mg | ORAL_TABLET | Freq: Once | ORAL | Status: AC
  Administered 2016-05-18: 650 mg via ORAL

## 2016-05-18 MED ORDER — SODIUM CHLORIDE 0.9 % IV SOLN
10.0000 mg | Freq: Once | INTRAVENOUS | Status: AC
  Administered 2016-05-18: 10 mg via INTRAVENOUS
  Filled 2016-05-18: qty 1

## 2016-05-18 MED ORDER — SODIUM CHLORIDE 0.9 % IV SOLN
Freq: Once | INTRAVENOUS | Status: AC
  Administered 2016-05-18: 09:00:00 via INTRAVENOUS

## 2016-05-18 MED ORDER — FAMOTIDINE IN NACL 20-0.9 MG/50ML-% IV SOLN
20.0000 mg | Freq: Once | INTRAVENOUS | Status: AC
  Administered 2016-05-18: 20 mg via INTRAVENOUS

## 2016-05-18 MED ORDER — SODIUM CHLORIDE 0.9 % IV SOLN
80.0000 mg/m2 | Freq: Once | INTRAVENOUS | Status: AC
  Administered 2016-05-18: 186 mg via INTRAVENOUS
  Filled 2016-05-18: qty 31

## 2016-05-18 MED ORDER — TRASTUZUMAB CHEMO INJECTION 440 MG
2.0000 mg/kg | Freq: Once | INTRAVENOUS | Status: AC
  Administered 2016-05-18: 231 mg via INTRAVENOUS
  Filled 2016-05-18: qty 11

## 2016-05-18 MED ORDER — HEPARIN SOD (PORK) LOCK FLUSH 100 UNIT/ML IV SOLN
500.0000 [IU] | Freq: Once | INTRAVENOUS | Status: AC | PRN
  Administered 2016-05-18: 500 [IU]
  Filled 2016-05-18: qty 5

## 2016-05-18 MED ORDER — FAMOTIDINE IN NACL 20-0.9 MG/50ML-% IV SOLN
INTRAVENOUS | Status: AC
  Filled 2016-05-18: qty 50

## 2016-05-18 MED ORDER — ACETAMINOPHEN 325 MG PO TABS
ORAL_TABLET | ORAL | Status: AC
  Filled 2016-05-18: qty 2

## 2016-05-18 MED ORDER — DIPHENHYDRAMINE HCL 50 MG/ML IJ SOLN
INTRAMUSCULAR | Status: AC
  Filled 2016-05-18: qty 1

## 2016-05-18 MED ORDER — SODIUM CHLORIDE 0.9% FLUSH
10.0000 mL | INTRAVENOUS | Status: DC | PRN
  Administered 2016-05-18: 10 mL
  Filled 2016-05-18: qty 10

## 2016-05-18 NOTE — Telephone Encounter (Signed)
Per patient request and desk RN, we have moved her 7/11 appts to the afternoon. Patient aware

## 2016-05-18 NOTE — Patient Instructions (Signed)
[facility] Discharge Instructions for Patients Receiving Chemotherapy  Today you received the following chemotherapy agents Herceptin/Taxol To help prevent nausea and vomiting after your treatment, we encourage you to take your nausea medication as prescribed.  If you develop nausea and vomiting that is not controlled by your nausea medication, call the clinic.   BELOW ARE SYMPTOMS THAT SHOULD BE REPORTED IMMEDIATELY:  *FEVER GREATER THAN 100.5 F  *CHILLS WITH OR WITHOUT FEVER  NAUSEA AND VOMITING THAT IS NOT CONTROLLED WITH YOUR NAUSEA MEDICATION  *UNUSUAL SHORTNESS OF BREATH  *UNUSUAL BRUISING OR BLEEDING  TENDERNESS IN MOUTH AND THROAT WITH OR WITHOUT PRESENCE OF ULCERS  *URINARY PROBLEMS  *BOWEL PROBLEMS  UNUSUAL RASH Items with * indicate a potential emergency and should be followed up as soon as possible.  Feel free to call the clinic you have any questions or concerns. The clinic phone number is [phone].  Please show the CHEMO ALERT CARD at check-in to the Emergency Department and triage nurse.   

## 2016-05-19 ENCOUNTER — Encounter: Payer: Self-pay | Admitting: *Deleted

## 2016-05-24 ENCOUNTER — Telehealth: Payer: Self-pay | Admitting: Hematology and Oncology

## 2016-05-24 ENCOUNTER — Other Ambulatory Visit (HOSPITAL_BASED_OUTPATIENT_CLINIC_OR_DEPARTMENT_OTHER): Payer: Commercial Managed Care - PPO

## 2016-05-24 ENCOUNTER — Encounter: Payer: Self-pay | Admitting: Hematology and Oncology

## 2016-05-24 ENCOUNTER — Ambulatory Visit (HOSPITAL_BASED_OUTPATIENT_CLINIC_OR_DEPARTMENT_OTHER): Payer: Commercial Managed Care - PPO | Admitting: Hematology and Oncology

## 2016-05-24 VITALS — BP 132/84 | HR 109 | Temp 98.6°F | Resp 18 | Ht 66.0 in | Wt 248.5 lb

## 2016-05-24 DIAGNOSIS — C50211 Malignant neoplasm of upper-inner quadrant of right female breast: Secondary | ICD-10-CM

## 2016-05-24 DIAGNOSIS — Z170 Estrogen receptor positive status [ER+]: Secondary | ICD-10-CM

## 2016-05-24 LAB — COMPREHENSIVE METABOLIC PANEL
ALT: 20 U/L (ref 0–55)
AST: 18 U/L (ref 5–34)
Albumin: 3.6 g/dL (ref 3.5–5.0)
Alkaline Phosphatase: 79 U/L (ref 40–150)
Anion Gap: 9 mEq/L (ref 3–11)
BUN: 13.5 mg/dL (ref 7.0–26.0)
CHLORIDE: 108 meq/L (ref 98–109)
CO2: 22 meq/L (ref 22–29)
Calcium: 9.1 mg/dL (ref 8.4–10.4)
Creatinine: 0.9 mg/dL (ref 0.6–1.1)
EGFR: 71 mL/min/{1.73_m2} — ABNORMAL LOW (ref >90)
GLUCOSE: 147 mg/dL — AB (ref 70–140)
POTASSIUM: 3.9 meq/L (ref 3.5–5.1)
SODIUM: 139 meq/L (ref 136–145)
Total Bilirubin: 0.34 mg/dL (ref 0.20–1.20)
Total Protein: 7.4 g/dL (ref 6.4–8.3)

## 2016-05-24 LAB — CBC WITH DIFFERENTIAL/PLATELET
BASO%: 1 % (ref 0.0–2.0)
Basophils Absolute: 0.1 10*3/uL (ref 0.0–0.1)
EOS%: 2.9 % (ref 0.0–7.0)
Eosinophils Absolute: 0.2 10*3/uL (ref 0.0–0.5)
HCT: 36.4 % (ref 34.8–46.6)
HEMOGLOBIN: 12 g/dL (ref 11.6–15.9)
LYMPH%: 34 % (ref 14.0–49.7)
MCH: 27.9 pg (ref 25.1–34.0)
MCHC: 32.9 g/dL (ref 31.5–36.0)
MCV: 84.8 fL (ref 79.5–101.0)
MONO#: 0.4 10*3/uL (ref 0.1–0.9)
MONO%: 5.4 % (ref 0.0–14.0)
NEUT%: 56.7 % (ref 38.4–76.8)
NEUTROS ABS: 4.3 10*3/uL (ref 1.5–6.5)
Platelets: 262 10*3/uL (ref 145–400)
RBC: 4.29 10*6/uL (ref 3.70–5.45)
RDW: 14.3 % (ref 11.2–14.5)
WBC: 7.6 10*3/uL (ref 3.9–10.3)
lymph#: 2.6 10*3/uL (ref 0.9–3.3)

## 2016-05-24 NOTE — Progress Notes (Signed)
Patient Care Team: [name], MD as PCP - General (Obstetrics and Gynecology) All SUMMARY OF ONCOLOGIC HISTORY:   Breast cancer of upper-inner quadrant of right female breast ([place])   01/21/2016 Initial Diagnosis Right breast biopsy: Invasive lobular cancer, ER 95%, PR 95%, Ki-67 5%, HER-2 positive ratio 2.52   02/01/2016 Breast MRI Right breast: Post biopsy hematoma measuring 1.9 x 1.3 x 2.5 cm surrounding ring of reactive enhancement, no lymphadenopathy. T2 N0 stage II a clinical stage   03/01/2016 Surgery Right lumpectomy: ILC, grade 2, 0.8 cm, ALH, additional superior margin : ILC grade to 0.3 cm , positive superior margin , 0/1 LN neg, mammoplasty bil : benign, ER 95%, PR 95%, HER-2 pos, Ki-67 5%, T1bN0 stage IA   03/15/2016 Surgery Right breast. Margin excision: Benign   04/20/2016 -  Chemotherapy Taxol Herceptin weekly 12 followed by Herceptin maintenance for 1 year    CHIEF COMPLIANT: Cycle 6 Taxol Herceptin tomorrow  INTERVAL HISTORY: Janet King is a 50 year old with above-mentioned history of right breast cancer treated with lumpectomy and is currently on adjuvant chemotherapy with Taxol and Herceptin. She is tolerating the treatment extremely well. She has noticed increased scalp irritation as well as nasal irritation. I suspect it is related to follicular irritation. She denies any neuropathy denies any nausea vomiting diarrhea or constipation. She is working full-time and states extremely busy.  REVIEW OF SYSTEMS:   Constitutional: Denies fevers, chills or abnormal weight loss, scalp irritation Eyes: Denies blurriness of vision Ears, nose, mouth, throat, and face: Denies mucositis or sore throat Respiratory: Denies cough, dyspnea or wheezes Cardiovascular: Denies palpitation, chest discomfort Gastrointestinal:  Denies nausea, heartburn or change in bowel habits Skin: Denies abnormal skin rashes Lymphatics: Denies new lymphadenopathy or easy bruising Neurological:Denies  numbness, tingling or new weaknesses Behavioral/Psych: Mood is stable, no new changes  Extremities: No lower extremity edema Breast:  denies any pain or lumps or nodules in either breasts All other systems were reviewed with the patient and are negative.  I have reviewed the past medical history, past surgical history, social history and family history with the patient and they are unchanged from previous note.  ALLERGIES:  is allergic to penicillin g.  MEDICATIONS:  Current Outpatient Prescriptions  Medication Sig Dispense Refill  . lidocaine-prilocaine (EMLA) cream Apply to affected area once 30 g 3  . LORazepam (ATIVAN) 0.5 MG tablet Take 1 tablet (0.5 mg total) by mouth at bedtime. 30 tablet 0  . ondansetron (ZOFRAN) 8 MG tablet Take 1 tablet (8 mg total) by mouth 2 (two) times daily as needed (Nausea or vomiting). 30 tablet 1  . prochlorperazine (COMPAZINE) 10 MG tablet Take 1 tablet (10 mg total) by mouth every 6 (six) hours as needed (Nausea or vomiting). 30 tablet 1  . SYNTHROID 112 MCG tablet   0  . Vitamin D, Ergocalciferol, (DRISDOL) 50000 units CAPS capsule Take 50,000 Units by mouth every 7 (seven) days.     No current facility-administered medications for this visit.    PHYSICAL EXAMINATION: ECOG PERFORMANCE STATUS: 1 - Symptomatic but completely ambulatory  Filed Vitals:   05/24/16 1441  BP: 132/84  Pulse: 109  Temp: 98.6 F (37 C)  Resp: 18   Filed Weights   05/24/16 1441  Weight: 248 lb 8 oz (112.719 kg)    GENERAL:alert, no distress and comfortable SKIN: skin color, texture, turgor are normal, no rashes or significant lesions EYES: normal, Conjunctiva are pink and non-injected, sclera clear OROPHARYNX:no exudate, no  erythema and lips, buccal mucosa, and tongue normal  NECK: supple, thyroid normal size, non-tender, without nodularity LYMPH:  no palpable lymphadenopathy in the cervical, axillary or inguinal LUNGS: clear to auscultation and percussion with  normal breathing effort HEART: regular rate & rhythm and no murmurs and no lower extremity edema ABDOMEN:abdomen soft, non-tender and normal bowel sounds MUSCULOSKELETAL:no cyanosis of digits and no clubbing  NEURO: alert & oriented x 3 with fluent speech, no focal motor/sensory deficits EXTREMITIES: No lower extremity edema   LABORATORY DATA:  I have reviewed the data as listed   Chemistry      Component Value Date/Time   NA 139 05/24/2016 1405   NA 141 04/17/2007 0925   K 3.9 05/24/2016 1405   K 4.1 04/17/2007 0925   CL 111 04/17/2007 0925   CO2 22 05/24/2016 1405   CO2 26 04/17/2007 0925   BUN 13.5 05/24/2016 1405   BUN 12 04/17/2007 0925   CREATININE 0.9 05/24/2016 1405   CREATININE 0.8 04/17/2007 0925      Component Value Date/Time   CALCIUM 9.1 05/24/2016 1405   CALCIUM 9.1 04/17/2007 0925   ALKPHOS 79 05/24/2016 1405   AST 18 05/24/2016 1405   ALT 20 05/24/2016 1405   BILITOT 0.34 05/24/2016 1405       Lab Results  Component Value Date   WBC 7.6 05/24/2016   HGB 12.0 05/24/2016   HCT 36.4 05/24/2016   MCV 84.8 05/24/2016   PLT 262 05/24/2016   NEUTROABS 4.3 05/24/2016     ASSESSMENT & PLAN:  Breast cancer of upper-inner quadrant of right female breast ([place]) Right lumpectomy 03/01/2016: ILC, grade 2, 0.8 cm, ALH, additional superior margin : ILC grade to 0.3 cm , positive superior margin , 0/1 LN neg, mammoplasty bil : benign, ER 95%, PR 95%, HER-2 positive on primary tumor and negative on secondary mass which had a ratio of 1.55, gene copy number is 2.25, Ki-67 5%, T1bN0 stage IA  HER-2: I discussed with the patient that the HER-2 was positive in the original tumor and the HER-2 on the second excision was negative. I was able to get this clarification from pathology. So the treatment plan is once again changed. Reexcision of superior margin: 5 2017: Benign  Treatment plan: 1. Adjuvant therapy with Taxol and Herceptin weekly 12 followed by Herceptin  maintenance every 3 weeks 1 year 2. Follow-up adjuvant radiation followed by 3. Antiestrogen therapy ----------------------------------------------------------------------------------------------------------------------------- Current treatment: Cycle 6 weekly Taxol Herceptin Tomorrow Echocardiogram 03/24/2016: EF 60-65%  Chemotherapy toxicities: 1. Flushing sensation 2. Scalp irritation, mild hair loss  Labs were reviewed Return to clinic in 3 weeks   No orders of the defined types were placed in this encounter.   The patient has a good understanding of the overall plan. she agrees with it. she will call with any problems that may develop before the next visit here.   Rulon Eisenmenger, MD 05/24/2016

## 2016-05-24 NOTE — Telephone Encounter (Signed)
Gave patient avs report and appointments for July and August. Patient scheduled for 8/2 lab/fu on 8/1 due to she needs tx to stay around 8:30 am, therefore she will do lab/fu the day before.

## 2016-05-24 NOTE — Assessment & Plan Note (Signed)
Right lumpectomy 03/01/2016: ILC, grade 2, 0.8 cm, ALH, additional superior margin : ILC grade to 0.3 cm , positive superior margin , 0/1 LN neg, mammoplasty bil : benign, ER 95%, PR 95%, HER-2 positive on primary tumor and negative on secondary mass which had a ratio of 1.55, gene copy number is 2.25, Ki-67 5%, T1bN0 stage IA  HER-2: I discussed with the patient that the HER-2 was positive in the original tumor and the HER-2 on the second excision was negative. I was able to get this clarification from pathology. So the treatment plan is once again changed. Reexcision of superior margin: 5 2017: Benign  Treatment plan: 1. Adjuvant therapy with Taxol and Herceptin weekly 12 followed by Herceptin maintenance every 3 weeks 1 year 2. Follow-up adjuvant radiation followed by 3. Antiestrogen therapy ----------------------------------------------------------------------------------------------------------------------------- Current treatment: Cycle 6 weekly Taxol Herceptin Echocardiogram 03/24/2016: EF 60-65%  Chemotherapy toxicities: 1. Flushing sensation 2. Scalp irritation, mild hair loss  Labs were reviewed Return to clinic in 2 weeks for cycle 8 and weekly for chemotherapy

## 2016-05-25 ENCOUNTER — Other Ambulatory Visit: Payer: Commercial Managed Care - PPO

## 2016-05-25 ENCOUNTER — Ambulatory Visit (HOSPITAL_BASED_OUTPATIENT_CLINIC_OR_DEPARTMENT_OTHER): Payer: Commercial Managed Care - PPO

## 2016-05-25 VITALS — BP 150/95 | HR 101 | Temp 98.5°F | Resp 20

## 2016-05-25 DIAGNOSIS — C50211 Malignant neoplasm of upper-inner quadrant of right female breast: Secondary | ICD-10-CM

## 2016-05-25 DIAGNOSIS — Z5112 Encounter for antineoplastic immunotherapy: Secondary | ICD-10-CM

## 2016-05-25 DIAGNOSIS — Z5111 Encounter for antineoplastic chemotherapy: Secondary | ICD-10-CM | POA: Diagnosis not present

## 2016-05-25 MED ORDER — TRASTUZUMAB CHEMO 150 MG IV SOLR
2.0000 mg/kg | Freq: Once | INTRAVENOUS | Status: AC
  Administered 2016-05-25: 231 mg via INTRAVENOUS
  Filled 2016-05-25: qty 11

## 2016-05-25 MED ORDER — SODIUM CHLORIDE 0.9% FLUSH
10.0000 mL | INTRAVENOUS | Status: DC | PRN
  Administered 2016-05-25: 10 mL
  Filled 2016-05-25: qty 10

## 2016-05-25 MED ORDER — HEPARIN SOD (PORK) LOCK FLUSH 100 UNIT/ML IV SOLN
500.0000 [IU] | Freq: Once | INTRAVENOUS | Status: AC | PRN
  Administered 2016-05-25: 500 [IU]
  Filled 2016-05-25: qty 5

## 2016-05-25 MED ORDER — DIPHENHYDRAMINE HCL 50 MG/ML IJ SOLN
INTRAMUSCULAR | Status: AC
  Filled 2016-05-25: qty 1

## 2016-05-25 MED ORDER — PACLITAXEL CHEMO INJECTION 300 MG/50ML: 80.0000 mg/m2 | Freq: Once | INTRAVENOUS | Status: DC

## 2016-05-25 MED ORDER — ACETAMINOPHEN 325 MG PO TABS
650.0000 mg | ORAL_TABLET | Freq: Once | ORAL | Status: AC
  Administered 2016-05-25: 650 mg via ORAL

## 2016-05-25 MED ORDER — DIPHENHYDRAMINE HCL 50 MG/ML IJ SOLN
25.0000 mg | Freq: Once | INTRAMUSCULAR | Status: AC
Start: 2016-05-25 — End: 2016-05-25
  Administered 2016-05-25: 25 mg via INTRAVENOUS

## 2016-05-25 MED ORDER — SODIUM CHLORIDE 0.9 % IV SOLN
Freq: Once | INTRAVENOUS | Status: AC
  Administered 2016-05-25: 09:00:00 via INTRAVENOUS

## 2016-05-25 MED ORDER — ACETAMINOPHEN 325 MG PO TABS
ORAL_TABLET | ORAL | Status: AC
  Filled 2016-05-25: qty 2

## 2016-05-25 MED ORDER — FAMOTIDINE IN NACL 20-0.9 MG/50ML-% IV SOLN
20.0000 mg | Freq: Once | INTRAVENOUS | Status: AC
  Administered 2016-05-25: 20 mg via INTRAVENOUS

## 2016-05-25 MED ORDER — FAMOTIDINE IN NACL 20-0.9 MG/50ML-% IV SOLN
INTRAVENOUS | Status: AC
  Filled 2016-05-25: qty 50

## 2016-05-25 MED ORDER — SODIUM CHLORIDE 0.9 % IV SOLN
10.0000 mg | Freq: Once | INTRAVENOUS | Status: AC
  Administered 2016-05-25: 10 mg via INTRAVENOUS
  Filled 2016-05-25: qty 1

## 2016-05-25 MED ORDER — SODIUM CHLORIDE 0.9 % IV SOLN
80.0000 mg/m2 | Freq: Once | INTRAVENOUS | Status: AC
  Administered 2016-05-25: 186 mg via INTRAVENOUS
  Filled 2016-05-25: qty 31

## 2016-05-25 NOTE — Patient Instructions (Signed)
[facility] Discharge Instructions for Patients Receiving Chemotherapy  Today you received the following chemotherapy agents Herceptin/Taxol To help prevent nausea and vomiting after your treatment, we encourage you to take your nausea medication as prescribed.  If you develop nausea and vomiting that is not controlled by your nausea medication, call the clinic.   BELOW ARE SYMPTOMS THAT SHOULD BE REPORTED IMMEDIATELY:  *FEVER GREATER THAN 100.5 F  *CHILLS WITH OR WITHOUT FEVER  NAUSEA AND VOMITING THAT IS NOT CONTROLLED WITH YOUR NAUSEA MEDICATION  *UNUSUAL SHORTNESS OF BREATH  *UNUSUAL BRUISING OR BLEEDING  TENDERNESS IN MOUTH AND THROAT WITH OR WITHOUT PRESENCE OF ULCERS  *URINARY PROBLEMS  *BOWEL PROBLEMS  UNUSUAL RASH Items with * indicate a potential emergency and should be followed up as soon as possible.  Feel free to call the clinic you have any questions or concerns. The clinic phone number is [phone].  Please show the CHEMO ALERT CARD at check-in to the Emergency Department and triage nurse.   

## 2016-06-01 ENCOUNTER — Ambulatory Visit (HOSPITAL_BASED_OUTPATIENT_CLINIC_OR_DEPARTMENT_OTHER): Payer: Commercial Managed Care - PPO

## 2016-06-01 ENCOUNTER — Other Ambulatory Visit (HOSPITAL_BASED_OUTPATIENT_CLINIC_OR_DEPARTMENT_OTHER): Payer: Commercial Managed Care - PPO

## 2016-06-01 VITALS — BP 137/82 | HR 87 | Temp 98.7°F | Resp 19

## 2016-06-01 DIAGNOSIS — C50211 Malignant neoplasm of upper-inner quadrant of right female breast: Secondary | ICD-10-CM

## 2016-06-01 DIAGNOSIS — Z5112 Encounter for antineoplastic immunotherapy: Secondary | ICD-10-CM

## 2016-06-01 DIAGNOSIS — Z5111 Encounter for antineoplastic chemotherapy: Secondary | ICD-10-CM

## 2016-06-01 LAB — COMPREHENSIVE METABOLIC PANEL
ALT: 19 U/L (ref 0–55)
ANION GAP: 9 meq/L (ref 3–11)
AST: 16 U/L (ref 5–34)
Albumin: 3.5 g/dL (ref 3.5–5.0)
Alkaline Phosphatase: 74 U/L (ref 40–150)
BUN: 13.5 mg/dL (ref 7.0–26.0)
CHLORIDE: 109 meq/L (ref 98–109)
CO2: 22 meq/L (ref 22–29)
CREATININE: 1 mg/dL (ref 0.6–1.1)
Calcium: 9.1 mg/dL (ref 8.4–10.4)
EGFR: 69 mL/min/{1.73_m2} — ABNORMAL LOW (ref >90)
GLUCOSE: 140 mg/dL (ref 70–140)
Potassium: 4.2 mEq/L (ref 3.5–5.1)
SODIUM: 141 meq/L (ref 136–145)
Total Bilirubin: 0.48 mg/dL (ref 0.20–1.20)
Total Protein: 7 g/dL (ref 6.4–8.3)

## 2016-06-01 LAB — CBC WITH DIFFERENTIAL/PLATELET
BASO%: 0.9 % (ref 0.0–2.0)
Basophils Absolute: 0.1 10*3/uL (ref 0.0–0.1)
EOS%: 4.6 % (ref 0.0–7.0)
Eosinophils Absolute: 0.3 10*3/uL (ref 0.0–0.5)
HCT: 34.3 % — ABNORMAL LOW (ref 34.8–46.6)
HGB: 11.5 g/dL — ABNORMAL LOW (ref 11.6–15.9)
LYMPH%: 25.9 % (ref 14.0–49.7)
MCH: 28.4 pg (ref 25.1–34.0)
MCHC: 33.6 g/dL (ref 31.5–36.0)
MCV: 84.5 fL (ref 79.5–101.0)
MONO#: 0.4 10*3/uL (ref 0.1–0.9)
MONO%: 5.7 % (ref 0.0–14.0)
NEUT%: 62.9 % (ref 38.4–76.8)
NEUTROS ABS: 4.7 10*3/uL (ref 1.5–6.5)
PLATELETS: 232 10*3/uL (ref 145–400)
RBC: 4.06 10*6/uL (ref 3.70–5.45)
RDW: 15 % — ABNORMAL HIGH (ref 11.2–14.5)
WBC: 7.5 10*3/uL (ref 3.9–10.3)
lymph#: 1.9 10*3/uL (ref 0.9–3.3)

## 2016-06-01 MED ORDER — DIPHENHYDRAMINE HCL 50 MG/ML IJ SOLN
25.0000 mg | Freq: Once | INTRAMUSCULAR | Status: AC
  Administered 2016-06-01: 25 mg via INTRAVENOUS

## 2016-06-01 MED ORDER — PACLITAXEL CHEMO INJECTION 300 MG/50ML: 80.0000 mg/m2 | Freq: Once | INTRAVENOUS | Status: DC

## 2016-06-01 MED ORDER — FAMOTIDINE IN NACL 20-0.9 MG/50ML-% IV SOLN
20.0000 mg | Freq: Once | INTRAVENOUS | Status: AC
  Administered 2016-06-01: 20 mg via INTRAVENOUS

## 2016-06-01 MED ORDER — SODIUM CHLORIDE 0.9 % IV SOLN
Freq: Once | INTRAVENOUS | Status: AC
  Administered 2016-06-01: 09:00:00 via INTRAVENOUS

## 2016-06-01 MED ORDER — SODIUM CHLORIDE 0.9 % IV SOLN
80.0000 mg/m2 | Freq: Once | INTRAVENOUS | Status: AC
  Administered 2016-06-01: 186 mg via INTRAVENOUS
  Filled 2016-06-01: qty 31

## 2016-06-01 MED ORDER — ACETAMINOPHEN 325 MG PO TABS
650.0000 mg | ORAL_TABLET | Freq: Once | ORAL | Status: AC
  Administered 2016-06-01: 650 mg via ORAL

## 2016-06-01 MED ORDER — SODIUM CHLORIDE 0.9 % IV SOLN
2.0000 mg/kg | Freq: Once | INTRAVENOUS | Status: AC
  Administered 2016-06-01: 231 mg via INTRAVENOUS
  Filled 2016-06-01: qty 11

## 2016-06-01 MED ORDER — DIPHENHYDRAMINE HCL 50 MG/ML IJ SOLN
INTRAMUSCULAR | Status: AC
  Filled 2016-06-01: qty 1

## 2016-06-01 MED ORDER — HEPARIN SOD (PORK) LOCK FLUSH 100 UNIT/ML IV SOLN
500.0000 [IU] | Freq: Once | INTRAVENOUS | Status: AC | PRN
  Administered 2016-06-01: 500 [IU]
  Filled 2016-06-01: qty 5

## 2016-06-01 MED ORDER — SODIUM CHLORIDE 0.9 % IV SOLN
10.0000 mg | Freq: Once | INTRAVENOUS | Status: AC
  Administered 2016-06-01: 10 mg via INTRAVENOUS
  Filled 2016-06-01: qty 1

## 2016-06-01 MED ORDER — FAMOTIDINE IN NACL 20-0.9 MG/50ML-% IV SOLN
INTRAVENOUS | Status: AC
  Filled 2016-06-01: qty 50

## 2016-06-01 MED ORDER — SODIUM CHLORIDE 0.9% FLUSH
10.0000 mL | INTRAVENOUS | Status: DC | PRN
  Administered 2016-06-01: 10 mL
  Filled 2016-06-01: qty 10

## 2016-06-01 MED ORDER — ACETAMINOPHEN 325 MG PO TABS
ORAL_TABLET | ORAL | Status: AC
  Filled 2016-06-01: qty 1

## 2016-06-01 NOTE — Progress Notes (Signed)
Patient using saline nasal gel and this has helped her nasal irritation.

## 2016-06-01 NOTE — Patient Instructions (Signed)
[facility] Discharge Instructions for Patients Receiving Chemotherapy  Today you received the following chemotherapy agents Herceptin/Taxol To help prevent nausea and vomiting after your treatment, we encourage you to take your nausea medication as prescribed.  If you develop nausea and vomiting that is not controlled by your nausea medication, call the clinic.   BELOW ARE SYMPTOMS THAT SHOULD BE REPORTED IMMEDIATELY:  *FEVER GREATER THAN 100.5 F  *CHILLS WITH OR WITHOUT FEVER  NAUSEA AND VOMITING THAT IS NOT CONTROLLED WITH YOUR NAUSEA MEDICATION  *UNUSUAL SHORTNESS OF BREATH  *UNUSUAL BRUISING OR BLEEDING  TENDERNESS IN MOUTH AND THROAT WITH OR WITHOUT PRESENCE OF ULCERS  *URINARY PROBLEMS  *BOWEL PROBLEMS  UNUSUAL RASH Items with * indicate a potential emergency and should be followed up as soon as possible.  Feel free to call the clinic you have any questions or concerns. The clinic phone number is [phone].  Please show the CHEMO ALERT CARD at check-in to the Emergency Department and triage nurse.   

## 2016-06-06 ENCOUNTER — Other Ambulatory Visit: Payer: Commercial Managed Care - PPO

## 2016-06-06 ENCOUNTER — Ambulatory Visit: Payer: Commercial Managed Care - PPO

## 2016-06-06 ENCOUNTER — Ambulatory Visit: Payer: Commercial Managed Care - PPO | Admitting: Hematology and Oncology

## 2016-06-08 ENCOUNTER — Ambulatory Visit (HOSPITAL_BASED_OUTPATIENT_CLINIC_OR_DEPARTMENT_OTHER): Payer: Commercial Managed Care - PPO

## 2016-06-08 ENCOUNTER — Other Ambulatory Visit (HOSPITAL_BASED_OUTPATIENT_CLINIC_OR_DEPARTMENT_OTHER): Payer: Commercial Managed Care - PPO

## 2016-06-08 ENCOUNTER — Other Ambulatory Visit: Payer: Commercial Managed Care - PPO

## 2016-06-08 ENCOUNTER — Ambulatory Visit: Payer: Commercial Managed Care - PPO

## 2016-06-08 VITALS — BP 132/72 | HR 92 | Temp 98.6°F | Resp 20

## 2016-06-08 DIAGNOSIS — Z5112 Encounter for antineoplastic immunotherapy: Secondary | ICD-10-CM | POA: Diagnosis not present

## 2016-06-08 DIAGNOSIS — C50211 Malignant neoplasm of upper-inner quadrant of right female breast: Secondary | ICD-10-CM | POA: Diagnosis not present

## 2016-06-08 DIAGNOSIS — Z5111 Encounter for antineoplastic chemotherapy: Secondary | ICD-10-CM

## 2016-06-08 LAB — CBC WITH DIFFERENTIAL/PLATELET
BASO%: 1 % (ref 0.0–2.0)
BASOS ABS: 0.1 10*3/uL (ref 0.0–0.1)
EOS%: 4.6 % (ref 0.0–7.0)
Eosinophils Absolute: 0.3 10*3/uL (ref 0.0–0.5)
HEMATOCRIT: 34.4 % — AB (ref 34.8–46.6)
HEMOGLOBIN: 11.3 g/dL — AB (ref 11.6–15.9)
LYMPH#: 2 10*3/uL (ref 0.9–3.3)
LYMPH%: 30.2 % (ref 14.0–49.7)
MCH: 28.5 pg (ref 25.1–34.0)
MCHC: 32.8 g/dL (ref 31.5–36.0)
MCV: 86.9 fL (ref 79.5–101.0)
MONO#: 0.4 10*3/uL (ref 0.1–0.9)
MONO%: 5.6 % (ref 0.0–14.0)
NEUT%: 58.6 % (ref 38.4–76.8)
NEUTROS ABS: 4 10*3/uL (ref 1.5–6.5)
Platelets: 231 10*3/uL (ref 145–400)
RBC: 3.96 10*6/uL (ref 3.70–5.45)
RDW: 15.3 % — AB (ref 11.2–14.5)
WBC: 6.8 10*3/uL (ref 3.9–10.3)

## 2016-06-08 LAB — COMPREHENSIVE METABOLIC PANEL
ALBUMIN: 3.6 g/dL (ref 3.5–5.0)
ALK PHOS: 74 U/L (ref 40–150)
ALT: 18 U/L (ref 0–55)
AST: 14 U/L (ref 5–34)
Anion Gap: 9 mEq/L (ref 3–11)
BILIRUBIN TOTAL: 0.38 mg/dL (ref 0.20–1.20)
BUN: 19.1 mg/dL (ref 7.0–26.0)
CALCIUM: 9.1 mg/dL (ref 8.4–10.4)
CO2: 22 mEq/L (ref 22–29)
CREATININE: 1 mg/dL (ref 0.6–1.1)
Chloride: 110 mEq/L — ABNORMAL HIGH (ref 98–109)
EGFR: 64 mL/min/{1.73_m2} — ABNORMAL LOW (ref >90)
GLUCOSE: 136 mg/dL (ref 70–140)
Potassium: 4.3 mEq/L (ref 3.5–5.1)
SODIUM: 141 meq/L (ref 136–145)
TOTAL PROTEIN: 7 g/dL (ref 6.4–8.3)

## 2016-06-08 MED ORDER — DIPHENHYDRAMINE HCL 50 MG/ML IJ SOLN
INTRAMUSCULAR | Status: AC
  Filled 2016-06-08: qty 1

## 2016-06-08 MED ORDER — SODIUM CHLORIDE 0.9 % IV SOLN
2.0000 mg/kg | Freq: Once | INTRAVENOUS | Status: AC
  Administered 2016-06-08: 231 mg via INTRAVENOUS
  Filled 2016-06-08: qty 11

## 2016-06-08 MED ORDER — PACLITAXEL CHEMO INJECTION 300 MG/50ML
80.0000 mg/m2 | Freq: Once | INTRAVENOUS | Status: AC
  Administered 2016-06-08: 186 mg via INTRAVENOUS
  Filled 2016-06-08: qty 31

## 2016-06-08 MED ORDER — FAMOTIDINE IN NACL 20-0.9 MG/50ML-% IV SOLN
INTRAVENOUS | Status: AC
  Filled 2016-06-08: qty 50

## 2016-06-08 MED ORDER — SODIUM CHLORIDE 0.9% FLUSH
10.0000 mL | INTRAVENOUS | Status: DC | PRN
  Administered 2016-06-08: 10 mL
  Filled 2016-06-08: qty 10

## 2016-06-08 MED ORDER — ACETAMINOPHEN 325 MG PO TABS
ORAL_TABLET | ORAL | Status: AC
  Filled 2016-06-08: qty 2

## 2016-06-08 MED ORDER — HEPARIN SOD (PORK) LOCK FLUSH 100 UNIT/ML IV SOLN
500.0000 [IU] | Freq: Once | INTRAVENOUS | Status: AC | PRN
  Administered 2016-06-08: 500 [IU]
  Filled 2016-06-08: qty 5

## 2016-06-08 MED ORDER — ACETAMINOPHEN 325 MG PO TABS
650.0000 mg | ORAL_TABLET | Freq: Once | ORAL | Status: AC
  Administered 2016-06-08: 650 mg via ORAL

## 2016-06-08 MED ORDER — FAMOTIDINE IN NACL 20-0.9 MG/50ML-% IV SOLN
20.0000 mg | Freq: Once | INTRAVENOUS | Status: AC
  Administered 2016-06-08: 20 mg via INTRAVENOUS

## 2016-06-08 MED ORDER — DIPHENHYDRAMINE HCL 50 MG/ML IJ SOLN
25.0000 mg | Freq: Once | INTRAMUSCULAR | Status: AC
  Administered 2016-06-08: 25 mg via INTRAVENOUS

## 2016-06-08 MED ORDER — SODIUM CHLORIDE 0.9 % IV SOLN
Freq: Once | INTRAVENOUS | Status: AC
  Administered 2016-06-08: 09:00:00 via INTRAVENOUS

## 2016-06-08 MED ORDER — SODIUM CHLORIDE 0.9 % IV SOLN
10.0000 mg | Freq: Once | INTRAVENOUS | Status: AC
  Administered 2016-06-08: 10 mg via INTRAVENOUS
  Filled 2016-06-08: qty 1

## 2016-06-08 NOTE — Patient Instructions (Signed)
[facility] Discharge Instructions for Patients Receiving Chemotherapy  Today you received the following chemotherapy agents: Taxol and Herceptin.   To help prevent nausea and vomiting after your treatment, we encourage you to take your nausea medication as directed.    If you develop nausea and vomiting that is not controlled by your nausea medication, call the clinic.   BELOW ARE SYMPTOMS THAT SHOULD BE REPORTED IMMEDIATELY:  *FEVER GREATER THAN 100.5 F  *CHILLS WITH OR WITHOUT FEVER  NAUSEA AND VOMITING THAT IS NOT CONTROLLED WITH YOUR NAUSEA MEDICATION  *UNUSUAL SHORTNESS OF BREATH  *UNUSUAL BRUISING OR BLEEDING  TENDERNESS IN MOUTH AND THROAT WITH OR WITHOUT PRESENCE OF ULCERS  *URINARY PROBLEMS  *BOWEL PROBLEMS  UNUSUAL RASH Items with * indicate a potential emergency and should be followed up as soon as possible.  Feel free to call the clinic you have any questions or concerns. The clinic phone number is [phone].  Please show the CHEMO ALERT CARD at check-in to the Emergency Department and triage nurse.   

## 2016-06-14 ENCOUNTER — Ambulatory Visit (HOSPITAL_BASED_OUTPATIENT_CLINIC_OR_DEPARTMENT_OTHER): Payer: Commercial Managed Care - PPO | Admitting: Hematology and Oncology

## 2016-06-14 ENCOUNTER — Telehealth: Payer: Self-pay | Admitting: Hematology and Oncology

## 2016-06-14 ENCOUNTER — Other Ambulatory Visit: Payer: Self-pay

## 2016-06-14 ENCOUNTER — Encounter: Payer: Self-pay | Admitting: Hematology and Oncology

## 2016-06-14 ENCOUNTER — Other Ambulatory Visit (HOSPITAL_BASED_OUTPATIENT_CLINIC_OR_DEPARTMENT_OTHER): Payer: Commercial Managed Care - PPO

## 2016-06-14 DIAGNOSIS — C50211 Malignant neoplasm of upper-inner quadrant of right female breast: Secondary | ICD-10-CM

## 2016-06-14 DIAGNOSIS — Z170 Estrogen receptor positive status [ER+]: Secondary | ICD-10-CM

## 2016-06-14 LAB — COMPREHENSIVE METABOLIC PANEL
ALBUMIN: 3.9 g/dL (ref 3.5–5.0)
ALK PHOS: 77 U/L (ref 40–150)
ALT: 21 U/L (ref 0–55)
ANION GAP: 7 meq/L (ref 3–11)
AST: 14 U/L (ref 5–34)
BUN: 13.9 mg/dL (ref 7.0–26.0)
CALCIUM: 9.5 mg/dL (ref 8.4–10.4)
CHLORIDE: 110 meq/L — AB (ref 98–109)
CO2: 22 mEq/L (ref 22–29)
CREATININE: 1 mg/dL (ref 0.6–1.1)
EGFR: 68 mL/min/{1.73_m2} — ABNORMAL LOW (ref >90)
Glucose: 109 mg/dl (ref 70–140)
POTASSIUM: 4.1 meq/L (ref 3.5–5.1)
Sodium: 140 mEq/L (ref 136–145)
Total Bilirubin: 0.39 mg/dL (ref 0.20–1.20)
Total Protein: 7.4 g/dL (ref 6.4–8.3)

## 2016-06-14 LAB — CBC WITH DIFFERENTIAL/PLATELET
BASO%: 0.9 % (ref 0.0–2.0)
BASOS ABS: 0.1 10*3/uL (ref 0.0–0.1)
EOS ABS: 0.2 10*3/uL (ref 0.0–0.5)
EOS%: 3.2 % (ref 0.0–7.0)
HEMATOCRIT: 36.3 % (ref 34.8–46.6)
HEMOGLOBIN: 12 g/dL (ref 11.6–15.9)
LYMPH#: 2.5 10*3/uL (ref 0.9–3.3)
LYMPH%: 34 % (ref 14.0–49.7)
MCH: 28.3 pg (ref 25.1–34.0)
MCHC: 33 g/dL (ref 31.5–36.0)
MCV: 85.7 fL (ref 79.5–101.0)
MONO#: 0.4 10*3/uL (ref 0.1–0.9)
MONO%: 5.3 % (ref 0.0–14.0)
NEUT#: 4.2 10*3/uL (ref 1.5–6.5)
NEUT%: 56.6 % (ref 38.4–76.8)
PLATELETS: 262 10*3/uL (ref 145–400)
RBC: 4.24 10*6/uL (ref 3.70–5.45)
RDW: 16.1 % — AB (ref 11.2–14.5)
WBC: 7.4 10*3/uL (ref 3.9–10.3)

## 2016-06-14 NOTE — Progress Notes (Signed)
Patient Care Team: [name], MD as PCP - General (Obstetrics and Gynecology)  SUMMARY OF ONCOLOGIC HISTORY:   Breast cancer of upper-inner quadrant of right female breast ([place])   01/21/2016 Initial Diagnosis    Right breast biopsy: Invasive lobular cancer, ER 95%, PR 95%, Ki-67 5%, HER-2 positive ratio 2.52     02/01/2016 Breast MRI    Right breast: Post biopsy hematoma measuring 1.9 x 1.3 x 2.5 cm surrounding ring of reactive enhancement, no lymphadenopathy. T2 N0 stage II a clinical stage     03/01/2016 Surgery    Right lumpectomy: ILC, grade 2, 0.8 cm, ALH, additional superior margin : ILC grade to 0.3 cm , positive superior margin , 0/1 LN neg, mammoplasty bil : benign, ER 95%, PR 95%, HER-2 pos, Ki-67 5%, T1bN0 stage IA     03/15/2016 Surgery    Right breast. Margin excision: Benign     04/20/2016 -  Chemotherapy    Taxol Herceptin weekly 12 followed by Herceptin maintenance for 1 year      CHIEF COMPLIANT: Cycle 9 Taxol Herceptin  INTERVAL HISTORY: Janet King is a 50 year old with above-mentioned history of right breast cancer who underwent lumpectomy and is currently on adjuvant chemotherapy with Taxol Herceptin. Today is cycle 9 of treatment. She is tolerating chemotherapy extremely well. She has near complete alopecia. She denies any nausea or vomiting. She does have mild fatigue for a day or 2. Does not have any neuropathy signs or symptoms.  REVIEW OF SYSTEMS:   Constitutional: Denies fevers, chills or abnormal weight loss Eyes: Denies blurriness of vision Ears, nose, mouth, throat, and face: Denies mucositis or sore throat Respiratory: Denies cough, dyspnea or wheezes Cardiovascular: Denies palpitation, chest discomfort Gastrointestinal:  Denies nausea, heartburn or change in bowel habits Skin: Denies abnormal skin rashes Lymphatics: Denies new lymphadenopathy or easy bruising Neurological:Denies numbness, tingling or new weaknesses, denies  neuropathy Behavioral/Psych: Mood is stable, no new changes Extremities: No lower extremity edema  All other systems were reviewed with the patient and are negative.  I have reviewed the past medical history, past surgical history, social history and family history with the patient and they are unchanged from previous note.  ALLERGIES:  is allergic to penicillin g.  MEDICATIONS:  Current Outpatient Prescriptions  Medication Sig Dispense Refill  . lidocaine-prilocaine (EMLA) cream Apply to affected area once 30 g 3  . LORazepam (ATIVAN) 0.5 MG tablet Take 1 tablet (0.5 mg total) by mouth at bedtime. 30 tablet 0  . ondansetron (ZOFRAN) 8 MG tablet Take 1 tablet (8 mg total) by mouth 2 (two) times daily as needed (Nausea or vomiting). 30 tablet 1  . prochlorperazine (COMPAZINE) 10 MG tablet Take 1 tablet (10 mg total) by mouth every 6 (six) hours as needed (Nausea or vomiting). 30 tablet 1  . SYNTHROID 112 MCG tablet   0  . Vitamin D, Ergocalciferol, (DRISDOL) 50000 units CAPS capsule Take 50,000 Units by mouth every 7 (seven) days.     No current facility-administered medications for this visit.     PHYSICAL EXAMINATION: ECOG PERFORMANCE STATUS: 1 - Symptomatic but completely ambulatory  Vitals:   06/14/16 1337  BP: (!) 143/90  Pulse: (!) 110  Resp: 20  Temp: 98.5 F (36.9 C)   Filed Weights   06/14/16 1337  Weight: 245 lb 4.8 oz (111.3 kg)    GENERAL:alert, no distress and comfortable SKIN: skin color, texture, turgor are normal, no rashes or significant lesions EYES: normal, Conjunctiva  are pink and non-injected, sclera clear OROPHARYNX:no exudate, no erythema and lips, buccal mucosa, and tongue normal  NECK: supple, thyroid normal size, non-tender, without nodularity LYMPH:  no palpable lymphadenopathy in the cervical, axillary or inguinal LUNGS: clear to auscultation and percussion with normal breathing effort HEART: regular rate & rhythm and no murmurs and no lower  extremity edema ABDOMEN:abdomen soft, non-tender and normal bowel sounds MUSCULOSKELETAL:no cyanosis of digits and no clubbing  NEURO: alert & oriented x 3 with fluent speech, no focal motor/sensory deficits EXTREMITIES: No lower extremity edema  LABORATORY DATA:  I have reviewed the data as listed   Chemistry      Component Value Date/Time   NA 141 06/08/2016 0813   K 4.3 06/08/2016 0813   CL 111 04/17/2007 0925   CO2 22 06/08/2016 0813   BUN 19.1 06/08/2016 0813   CREATININE 1.0 06/08/2016 0813      Component Value Date/Time   CALCIUM 9.1 06/08/2016 0813   ALKPHOS 74 06/08/2016 0813   AST 14 06/08/2016 0813   ALT 18 06/08/2016 0813   BILITOT 0.38 06/08/2016 0813       Lab Results  Component Value Date   WBC 7.4 06/14/2016   HGB 12.0 06/14/2016   HCT 36.3 06/14/2016   MCV 85.7 06/14/2016   PLT 262 06/14/2016   NEUTROABS 4.2 06/14/2016     ASSESSMENT & PLAN:  Breast cancer of upper-inner quadrant of right female breast ([place]) Right lumpectomy 03/01/2016: ILC, grade 2, 0.8 cm, ALH, additional superior margin : ILC grade to 0.3 cm , positive superior margin , 0/1 LN neg, mammoplasty bil : benign, ER 95%, PR 95%, HER-2 positive on primary tumor and negative on secondary mass which had a ratio of 1.55, gene copy number is 2.25, Ki-67 5%, T1bN0 stage IA  HER-2: HER-2 was positive in the original tumor and the HER-2 on the second excision was negative. I was able to get this clarification from pathology.  Reexcision of superior margin: 5 2017: Benign  Treatment plan: 1. Adjuvant therapy with Taxol and Herceptin weekly 12 followed by Herceptin maintenance every 3 weeks 1 year 2. Follow-up adjuvant radiation followed by 3. Antiestrogen therapy ----------------------------------------------------------------------------------------------------------------------------- Current treatment: Cycle 9 weekly Taxol Herceptin Echocardiogram 03/24/2016: EF 60-65%  Chemotherapy  toxicities: 1. Flushing sensation 2. Alopecia  Monitoring closely for chemotherapy toxicities. Patient will need an echocardiogram this month. Labs were reviewed Return to clinic in 3 weeks for cycle 12.   No orders of the defined types were placed in this encounter.  The patient has a good understanding of the overall plan. she agrees with it. she will call with any problems that may develop before the next visit here.   Rulon Eisenmenger, MD 06/14/16

## 2016-06-14 NOTE — Assessment & Plan Note (Signed)
Right lumpectomy 03/01/2016: ILC, grade 2, 0.8 cm, ALH, additional superior margin : ILC grade to 0.3 cm , positive superior margin , 0/1 LN neg, mammoplasty bil : benign, ER 95%, PR 95%, HER-2 positive on primary tumor and negative on secondary mass which had a ratio of 1.55, gene copy number is 2.25, Ki-67 5%, T1bN0 stage IA  HER-2: HER-2 was positive in the original tumor and the HER-2 on the second excision was negative. I was able to get this clarification from pathology.  Reexcision of superior margin: 5 2017: Benign  Treatment plan: 1. Adjuvant therapy with Taxol and Herceptin weekly 12 followed by Herceptin maintenance every 3 weeks 1 year 2. Follow-up adjuvant radiation followed by 3. Antiestrogen therapy ----------------------------------------------------------------------------------------------------------------------------- Current treatment: Cycle 9 weekly Taxol Herceptin Echocardiogram 03/24/2016: EF 60-65%  Chemotherapy toxicities: 1. Flushing sensation 2. Scalp irritation, mild hair loss  Monitoring closely for chemotherapy toxicities. Patient will need an echocardiogram this month. Labs were reviewed Return to clinic in 3 weeks for cycle 12.

## 2016-06-14 NOTE — Telephone Encounter (Signed)
appt made and avs printed °

## 2016-06-15 ENCOUNTER — Ambulatory Visit (HOSPITAL_BASED_OUTPATIENT_CLINIC_OR_DEPARTMENT_OTHER): Payer: Commercial Managed Care - PPO

## 2016-06-15 VITALS — BP 131/92 | HR 106 | Temp 97.8°F | Resp 18

## 2016-06-15 DIAGNOSIS — Z5112 Encounter for antineoplastic immunotherapy: Secondary | ICD-10-CM | POA: Diagnosis not present

## 2016-06-15 DIAGNOSIS — Z5111 Encounter for antineoplastic chemotherapy: Secondary | ICD-10-CM | POA: Diagnosis not present

## 2016-06-15 DIAGNOSIS — C50211 Malignant neoplasm of upper-inner quadrant of right female breast: Secondary | ICD-10-CM

## 2016-06-15 MED ORDER — DIPHENHYDRAMINE HCL 50 MG/ML IJ SOLN
INTRAMUSCULAR | Status: AC
  Filled 2016-06-15: qty 1

## 2016-06-15 MED ORDER — HEPARIN SOD (PORK) LOCK FLUSH 100 UNIT/ML IV SOLN
500.0000 [IU] | Freq: Once | INTRAVENOUS | Status: AC | PRN
  Administered 2016-06-15: 500 [IU]
  Filled 2016-06-15: qty 5

## 2016-06-15 MED ORDER — SODIUM CHLORIDE 0.9 % IV SOLN
Freq: Once | INTRAVENOUS | Status: AC
  Administered 2016-06-15: 09:00:00 via INTRAVENOUS

## 2016-06-15 MED ORDER — ACETAMINOPHEN 325 MG PO TABS
650.0000 mg | ORAL_TABLET | Freq: Once | ORAL | Status: AC
Start: 2016-06-15 — End: 2016-06-15
  Administered 2016-06-15: 650 mg via ORAL

## 2016-06-15 MED ORDER — TRASTUZUMAB CHEMO 150 MG IV SOLR
2.0000 mg/kg | Freq: Once | INTRAVENOUS | Status: AC
  Administered 2016-06-15: 231 mg via INTRAVENOUS
  Filled 2016-06-15: qty 11

## 2016-06-15 MED ORDER — FAMOTIDINE IN NACL 20-0.9 MG/50ML-% IV SOLN
INTRAVENOUS | Status: AC
  Filled 2016-06-15: qty 50

## 2016-06-15 MED ORDER — FAMOTIDINE IN NACL 20-0.9 MG/50ML-% IV SOLN
20.0000 mg | Freq: Once | INTRAVENOUS | Status: AC
  Administered 2016-06-15: 20 mg via INTRAVENOUS

## 2016-06-15 MED ORDER — SODIUM CHLORIDE 0.9% FLUSH
10.0000 mL | INTRAVENOUS | Status: DC | PRN
  Administered 2016-06-15: 10 mL
  Filled 2016-06-15: qty 10

## 2016-06-15 MED ORDER — SODIUM CHLORIDE 0.9 % IV SOLN
10.0000 mg | Freq: Once | INTRAVENOUS | Status: AC
  Administered 2016-06-15: 10 mg via INTRAVENOUS
  Filled 2016-06-15: qty 1

## 2016-06-15 MED ORDER — DIPHENHYDRAMINE HCL 50 MG/ML IJ SOLN
25.0000 mg | Freq: Once | INTRAMUSCULAR | Status: AC
  Administered 2016-06-15: 25 mg via INTRAVENOUS

## 2016-06-15 MED ORDER — ACETAMINOPHEN 325 MG PO TABS
ORAL_TABLET | ORAL | Status: AC
  Filled 2016-06-15: qty 2

## 2016-06-15 MED ORDER — SODIUM CHLORIDE 0.9 % IV SOLN
80.0000 mg/m2 | Freq: Once | INTRAVENOUS | Status: AC
  Administered 2016-06-15: 186 mg via INTRAVENOUS
  Filled 2016-06-15: qty 31

## 2016-06-15 NOTE — Patient Instructions (Signed)
[facility] Discharge Instructions for Patients Receiving Chemotherapy  Today you received the following chemotherapy agents: Herceptin and Taxol.  To help prevent nausea and vomiting after your treatment, we encourage you to take your nausea medication as directed.   If you develop nausea and vomiting that is not controlled by your nausea medication, call the clinic.   BELOW ARE SYMPTOMS THAT SHOULD BE REPORTED IMMEDIATELY:  *FEVER GREATER THAN 100.5 F  *CHILLS WITH OR WITHOUT FEVER  NAUSEA AND VOMITING THAT IS NOT CONTROLLED WITH YOUR NAUSEA MEDICATION  *UNUSUAL SHORTNESS OF BREATH  *UNUSUAL BRUISING OR BLEEDING  TENDERNESS IN MOUTH AND THROAT WITH OR WITHOUT PRESENCE OF ULCERS  *URINARY PROBLEMS  *BOWEL PROBLEMS  UNUSUAL RASH Items with * indicate a potential emergency and should be followed up as soon as possible.  Feel free to call the clinic you have any questions or concerns. The clinic phone number is [phone].  Please show the CHEMO ALERT CARD at check-in to the Emergency Department and triage nurse.   

## 2016-06-22 ENCOUNTER — Ambulatory Visit (HOSPITAL_BASED_OUTPATIENT_CLINIC_OR_DEPARTMENT_OTHER): Payer: Commercial Managed Care - PPO

## 2016-06-22 ENCOUNTER — Other Ambulatory Visit (HOSPITAL_BASED_OUTPATIENT_CLINIC_OR_DEPARTMENT_OTHER): Payer: Commercial Managed Care - PPO

## 2016-06-22 VITALS — BP 120/74 | HR 80 | Temp 98.2°F | Resp 17

## 2016-06-22 DIAGNOSIS — Z5112 Encounter for antineoplastic immunotherapy: Secondary | ICD-10-CM | POA: Diagnosis not present

## 2016-06-22 DIAGNOSIS — Z5111 Encounter for antineoplastic chemotherapy: Secondary | ICD-10-CM | POA: Diagnosis not present

## 2016-06-22 DIAGNOSIS — C50211 Malignant neoplasm of upper-inner quadrant of right female breast: Secondary | ICD-10-CM

## 2016-06-22 LAB — COMPREHENSIVE METABOLIC PANEL
ALBUMIN: 3.7 g/dL (ref 3.5–5.0)
ALK PHOS: 68 U/L (ref 40–150)
ALT: 23 U/L (ref 0–55)
AST: 17 U/L (ref 5–34)
Anion Gap: 9 mEq/L (ref 3–11)
BILIRUBIN TOTAL: 0.6 mg/dL (ref 0.20–1.20)
BUN: 22.7 mg/dL (ref 7.0–26.0)
CALCIUM: 9.4 mg/dL (ref 8.4–10.4)
CHLORIDE: 109 meq/L (ref 98–109)
CO2: 22 mEq/L (ref 22–29)
Creatinine: 1 mg/dL (ref 0.6–1.1)
EGFR: 64 mL/min/{1.73_m2} — AB (ref >90)
Glucose: 127 mg/dl (ref 70–140)
POTASSIUM: 4.3 meq/L (ref 3.5–5.1)
Sodium: 140 mEq/L (ref 136–145)
Total Protein: 6.9 g/dL (ref 6.4–8.3)

## 2016-06-22 LAB — CBC WITH DIFFERENTIAL/PLATELET
BASO%: 1.2 % (ref 0.0–2.0)
BASOS ABS: 0.1 10*3/uL (ref 0.0–0.1)
EOS ABS: 0.2 10*3/uL (ref 0.0–0.5)
EOS%: 4 % (ref 0.0–7.0)
HEMATOCRIT: 34.3 % — AB (ref 34.8–46.6)
HEMOGLOBIN: 11.6 g/dL (ref 11.6–15.9)
LYMPH#: 1.8 10*3/uL (ref 0.9–3.3)
LYMPH%: 28.5 % (ref 14.0–49.7)
MCH: 29 pg (ref 25.1–34.0)
MCHC: 33.7 g/dL (ref 31.5–36.0)
MCV: 86.1 fL (ref 79.5–101.0)
MONO#: 0.4 10*3/uL (ref 0.1–0.9)
MONO%: 6.6 % (ref 0.0–14.0)
NEUT#: 3.7 10*3/uL (ref 1.5–6.5)
NEUT%: 59.7 % (ref 38.4–76.8)
Platelets: 232 10*3/uL (ref 145–400)
RBC: 3.98 10*6/uL (ref 3.70–5.45)
RDW: 16.5 % — AB (ref 11.2–14.5)
WBC: 6.2 10*3/uL (ref 3.9–10.3)

## 2016-06-22 MED ORDER — HEPARIN SOD (PORK) LOCK FLUSH 100 UNIT/ML IV SOLN
500.0000 [IU] | Freq: Once | INTRAVENOUS | Status: AC | PRN
  Administered 2016-06-22: 500 [IU]
  Filled 2016-06-22: qty 5

## 2016-06-22 MED ORDER — SODIUM CHLORIDE 0.9 % IV SOLN
10.0000 mg | Freq: Once | INTRAVENOUS | Status: AC
  Administered 2016-06-22: 10 mg via INTRAVENOUS
  Filled 2016-06-22: qty 1

## 2016-06-22 MED ORDER — DIPHENHYDRAMINE HCL 50 MG/ML IJ SOLN
25.0000 mg | Freq: Once | INTRAMUSCULAR | Status: AC
  Administered 2016-06-22: 25 mg via INTRAVENOUS

## 2016-06-22 MED ORDER — FAMOTIDINE IN NACL 20-0.9 MG/50ML-% IV SOLN
INTRAVENOUS | Status: AC
  Filled 2016-06-22: qty 50

## 2016-06-22 MED ORDER — ACETAMINOPHEN 325 MG PO TABS
ORAL_TABLET | ORAL | Status: AC
  Filled 2016-06-22: qty 2

## 2016-06-22 MED ORDER — DIPHENHYDRAMINE HCL 50 MG/ML IJ SOLN
INTRAMUSCULAR | Status: AC
  Filled 2016-06-22: qty 1

## 2016-06-22 MED ORDER — TRASTUZUMAB CHEMO 150 MG IV SOLR
2.0000 mg/kg | Freq: Once | INTRAVENOUS | Status: AC
  Administered 2016-06-22: 231 mg via INTRAVENOUS
  Filled 2016-06-22: qty 11

## 2016-06-22 MED ORDER — SODIUM CHLORIDE 0.9% FLUSH
10.0000 mL | INTRAVENOUS | Status: DC | PRN
  Administered 2016-06-22: 10 mL
  Filled 2016-06-22: qty 10

## 2016-06-22 MED ORDER — FAMOTIDINE IN NACL 20-0.9 MG/50ML-% IV SOLN
20.0000 mg | Freq: Once | INTRAVENOUS | Status: AC
  Administered 2016-06-22: 20 mg via INTRAVENOUS

## 2016-06-22 MED ORDER — SODIUM CHLORIDE 0.9 % IV SOLN
Freq: Once | INTRAVENOUS | Status: AC
  Administered 2016-06-22: 09:00:00 via INTRAVENOUS

## 2016-06-22 MED ORDER — ACETAMINOPHEN 325 MG PO TABS
650.0000 mg | ORAL_TABLET | Freq: Once | ORAL | Status: AC
  Administered 2016-06-22: 650 mg via ORAL

## 2016-06-22 MED ORDER — SODIUM CHLORIDE 0.9 % IV SOLN
80.0000 mg/m2 | Freq: Once | INTRAVENOUS | Status: AC
  Administered 2016-06-22: 186 mg via INTRAVENOUS
  Filled 2016-06-22: qty 31

## 2016-06-22 NOTE — Patient Instructions (Signed)
[facility] Discharge Instructions for Patients Receiving Chemotherapy  Today you received the following chemotherapy agents: Herceptin and Taxol.  To help prevent nausea and vomiting after your treatment, we encourage you to take your nausea medication as directed.   If you develop nausea and vomiting that is not controlled by your nausea medication, call the clinic.   BELOW ARE SYMPTOMS THAT SHOULD BE REPORTED IMMEDIATELY:  *FEVER GREATER THAN 100.5 F  *CHILLS WITH OR WITHOUT FEVER  NAUSEA AND VOMITING THAT IS NOT CONTROLLED WITH YOUR NAUSEA MEDICATION  *UNUSUAL SHORTNESS OF BREATH  *UNUSUAL BRUISING OR BLEEDING  TENDERNESS IN MOUTH AND THROAT WITH OR WITHOUT PRESENCE OF ULCERS  *URINARY PROBLEMS  *BOWEL PROBLEMS  UNUSUAL RASH Items with * indicate a potential emergency and should be followed up as soon as possible.  Feel free to call the clinic you have any questions or concerns. The clinic phone number is [phone].  Please show the CHEMO ALERT CARD at check-in to the Emergency Department and triage nurse.   

## 2016-06-27 ENCOUNTER — Ambulatory Visit (HOSPITAL_COMMUNITY)
Admission: RE | Admit: 2016-06-27 | Discharge: 2016-06-27 | Disposition: A | Discharge status: Home / Self Care | Payer: Commercial Managed Care - PPO | Source: Ambulatory Visit | Attending: Internal Medicine | Admitting: Internal Medicine

## 2016-06-27 ENCOUNTER — Encounter (HOSPITAL_COMMUNITY): Payer: Commercial Managed Care - PPO | Admitting: Internal Medicine

## 2016-06-27 ENCOUNTER — Encounter (HOSPITAL_COMMUNITY): Payer: Self-pay

## 2016-06-27 ENCOUNTER — Ambulatory Visit (HOSPITAL_BASED_OUTPATIENT_CLINIC_OR_DEPARTMENT_OTHER)
Admission: RE | Admit: 2016-06-27 | Discharge: 2016-06-27 | Disposition: A | Discharge status: Home / Self Care | Payer: Commercial Managed Care - PPO | Source: Ambulatory Visit | Attending: Internal Medicine | Admitting: Internal Medicine

## 2016-06-27 VITALS — BP 114/84 | HR 111 | Wt 240.5 lb

## 2016-06-27 DIAGNOSIS — E039 Hypothyroidism, unspecified: Secondary | ICD-10-CM | POA: Insufficient documentation

## 2016-06-27 DIAGNOSIS — Z79899 Other long term (current) drug therapy: Secondary | ICD-10-CM | POA: Insufficient documentation

## 2016-06-27 DIAGNOSIS — Z09 Encounter for follow-up examination after completed treatment for conditions other than malignant neoplasm: Secondary | ICD-10-CM | POA: Diagnosis present

## 2016-06-27 DIAGNOSIS — I427 Cardiomyopathy due to drug and external agent: Secondary | ICD-10-CM

## 2016-06-27 DIAGNOSIS — C50211 Malignant neoplasm of upper-inner quadrant of right female breast: Secondary | ICD-10-CM | POA: Insufficient documentation

## 2016-06-27 DIAGNOSIS — R000 Tachycardia, unspecified: Secondary | ICD-10-CM | POA: Diagnosis not present

## 2016-06-27 DIAGNOSIS — T451X5A Adverse effect of antineoplastic and immunosuppressive drugs, initial encounter: Secondary | ICD-10-CM | POA: Diagnosis not present

## 2016-06-27 MED ORDER — CARVEDILOL 3.125 MG PO TABS: 3.1250 mg | ORAL_TABLET | Freq: Two times a day (BID) | ORAL | 3 refills | Status: DC

## 2016-06-27 NOTE — Progress Notes (Signed)
  Echocardiogram 2D Echocardiogram has been performed.  Donata Clay 06/27/2016, 12:07 PM

## 2016-06-27 NOTE — Progress Notes (Signed)
Patient ID: Janet King, female   DOB: [birth year], 50 y.o.   MRN: [id number]    Cardio-Oncology Clinic Consult Note   Referring Physician: Dr Lindi Adie Primary Care: Primary Cardiologist: New   HPI:  Janet King is a 50 y.o. female with cancer of the right female breast diagnosed 3/17 referred by Dr. Lindi Adie for enrollment into the cardio-oncology clinic.   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 T2 N0 stage II a clinical stage  03/01/2016 Surgery Right lumpectomy: ILC, grade 2, 0.8 cm, ALH, additional superior margin : ILC grade to 0.3 cm , positive superior margin , 0/1 LN neg, mammoplasty bil : benign, ER 95%, PR 95%, HER-2 pos, Ki-67 5%, T1bN0 stage IA   She started chemotherapy including Herceptin in 5/17.   Denies any known cardiac history.  No SOB, CP, lightheadedness, peripheral edema.   Echo 03/24/16 LVEF 65-70%, Ls' 12.0, GLS -19.3% Echo 06/27/16 LVEF 50%, lateral s' 7, GLS -17.2%  ECG: sinus tachycardia, rate 111  Review of Systems: All systems reviewed and negative except as per HPI.   Past Medical History:  Diagnosis Date  . Baker's cyst of knee   . Breast cancer ([place])    right  . H/O bone density study   . Hypothyroidism     Current Outpatient Prescriptions  Medication Sig Dispense Refill  . lidocaine-prilocaine (EMLA) cream Apply to affected area once 30 g 3  . SYNTHROID 112 MCG tablet   0  . Vitamin D, Ergocalciferol, (DRISDOL) 50000 units CAPS capsule Take 50,000 Units by mouth every 7 (seven) days.    . carvedilol (COREG) 3.125 MG tablet Take 1 tablet (3.125 mg total) by mouth 2 (two) times daily. 180 tablet 3   No current facility-administered medications for this encounter.      Allergies  Allergen Reactions  . Penicillin G Rash      Social History   Social History  . Marital status: Single    Spouse name: N/A  . Number of children: 0  . Years of education: N/A   Occupational History  . 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 Smoking status: Never Smoker  . Smokeless tobacco: Never Used  . Alcohol use 0.0 oz/week     Comment: occ - once every 3 mos  . Drug use: No  . Sexual activity: Not Currently   Other Topics Concern  . Not on file   Social History Narrative  . No narrative on file      Family History  Problem Relation Age of Onset  . Breast cancer Mother 18  . Heart disease Maternal Grandfather   . Colon cancer Paternal Grandmother     dx. 61s  . Cancer Other     maternal great aunt (MGM's sister) dx. with NOS cancer at older age  . Breast cancer Other     maternal great aunt (MGF's sister) dx. in her 36s; s/p mastectomy  . Cancer Other     paternal great aunt (PGM's sister) dx. with NOS cancer at older age    18:   06/27/16 1206  BP: 114/84  Pulse: (!) 111  SpO2: 99%  Weight: 240 lb 8 oz (109.1 kg)    PHYSICAL EXAM: General:  Well appearing. No respiratory difficulty HEENT: normal Neck: supple. no JVD. Carotids 2+ bilat; no bruits. No lymphadenopathy or thryomegaly appreciated. Cor: PMI nondisplaced. Regular rate & rhythm. No rubs, gallops or murmurs. Lungs: clear Abdomen: Obese, soft, nontender, nondistended. No hepatosplenomegaly. No bruits or masses. Good bowel sounds. Extremities: no cyanosis, clubbing, rash, edema Neuro: alert & oriented x 3, cranial nerves grossly intact. moves all 4 extremities w/o difficulty. Affect pleasant.   Assessment/ Plan   Breast cancer of upper-inner quadrant of right female breast:  She started adjuvant therapy 5/17 with Taxol and Herceptin weekly x 12 followed by Herceptin maintenance every 3 weeks 1 year.  I reviewed today's echo and compared it  with the prior echo.  EF is now mildly decreased at 50% with fall in both lateral s' and strain.  I am concerned for possible early Herceptin-related cardiotoxicity.   - I would recommend holding Herceptin for the next month.  - Start Coreg 3.125 mg bid.  - Repeat echo at 1 month to look for improvement.  If improved, would rechallenge with Herceptin.   Sanjeev Main,MD 06/27/2016

## 2016-06-27 NOTE — Patient Instructions (Signed)
HOLD Herceptin treatment for 1 month until echocardiogram and follow up with Dr. Aundra Dubin.  START Carvedilol (Coreg) 3.125 mg tablet twice daily (10-12 hrs apart).  Do the following things EVERYDAY: 1) Weigh yourself in the morning before breakfast. Write it down and keep it in a log. 2) Take your medicines as prescribed 3) Eat low salt foods-Limit salt (sodium) to 2000 mg per day.  4) Stay as active as you can everyday 5) Limit all fluids for the day to less than 2 liters

## 2016-06-28 ENCOUNTER — Telehealth: Payer: Self-pay | Admitting: *Deleted

## 2016-06-28 NOTE — Progress Notes (Signed)
Janet Lose, MD sent to Larey Dresser, MD  Cc: Prentiss Bells, RN        Thank you very much  Janet King,  Can you please hold Janet King's Herceptin for a month (until we hear the OK from [place]).   Previous Messages    ----- Message -----  From: Larey Dresser, MD  Sent: 06/27/2016 11:51 PM  To: Janet Lose, MD   I reviewed Janet King's echo today. Signs of early cardiotoxicity noted. I would like to keep her off Herceptin for a month if possible. Plan to start Coreg 3.125 mg bid and repeat echo at 1 month. If improved, would rechallenge with Herceptin.      Per Dr. Lindi Adie, notified infusion and pharmacy to hold herceptin 06/29/16.  Not added to appt desk to hold herceptin.

## 2016-06-28 NOTE — Telephone Encounter (Signed)
Called pt - she wanted to be sure Dr. Lindi Adie knew about the new heart issues.  I let pt know Dr. Lindi Adie is aware and has spoken with cardiologist; that herceptin will be held tomorrow but she will receive taxol, and that the plan at this time is to hold the herceptin and re-check her echo in a month.    Pt is concerned that this will prolong/extend the amount of time she will have to receive chemo.  "I don't want to be taken chemo 3 years from now.  I can't  handle that thought."  I re-assured patient that the doctors will be keeping a close watch on her and that whether or not this extends her treatment time will be a judgement call made by Dr. Lindi Adie when the time comes.  I reassured her that this is a common side effect and that we have had many patients able to go back on the herceptin and complete their treatment.    Pt is concerned about taking Coreg as she was told fatigue can be a side effect.  She stated "I can't get any more tire and be able to work."  Pt became tearful at this point.  I advised patient we would be happy to help her with disability paperwork and offered support services.  Pt states "she can't do that" as to disability and "that's not my thing" regarding mentors or peer support.  Pt expressed appreciation.    Routed to social work.

## 2016-06-28 NOTE — Telephone Encounter (Signed)
"  I need to talk with Dr. Lindi Adie."  Asked may I tell him what this is in reference to.  "I have questions for him.  It's not an emergency."

## 2016-06-29 ENCOUNTER — Ambulatory Visit: Payer: Commercial Managed Care - PPO | Admitting: Hematology and Oncology

## 2016-06-29 ENCOUNTER — Ambulatory Visit (HOSPITAL_BASED_OUTPATIENT_CLINIC_OR_DEPARTMENT_OTHER): Payer: Commercial Managed Care - PPO

## 2016-06-29 ENCOUNTER — Other Ambulatory Visit (HOSPITAL_BASED_OUTPATIENT_CLINIC_OR_DEPARTMENT_OTHER): Payer: Commercial Managed Care - PPO

## 2016-06-29 VITALS — BP 125/77 | HR 85 | Temp 98.1°F | Resp 18

## 2016-06-29 DIAGNOSIS — C50211 Malignant neoplasm of upper-inner quadrant of right female breast: Secondary | ICD-10-CM

## 2016-06-29 DIAGNOSIS — Z5111 Encounter for antineoplastic chemotherapy: Secondary | ICD-10-CM | POA: Diagnosis not present

## 2016-06-29 LAB — COMPREHENSIVE METABOLIC PANEL
ALT: 24 U/L (ref 0–55)
AST: 16 U/L (ref 5–34)
Albumin: 3.6 g/dL (ref 3.5–5.0)
Alkaline Phosphatase: 71 U/L (ref 40–150)
Anion Gap: 7 mEq/L (ref 3–11)
BILIRUBIN TOTAL: 0.44 mg/dL (ref 0.20–1.20)
BUN: 17.9 mg/dL (ref 7.0–26.0)
CO2: 23 meq/L (ref 22–29)
Calcium: 9.6 mg/dL (ref 8.4–10.4)
Chloride: 113 mEq/L — ABNORMAL HIGH (ref 98–109)
Creatinine: 1 mg/dL (ref 0.6–1.1)
EGFR: 63 mL/min/{1.73_m2} — AB (ref >90)
GLUCOSE: 137 mg/dL (ref 70–140)
Potassium: 4.3 mEq/L (ref 3.5–5.1)
SODIUM: 142 meq/L (ref 136–145)
TOTAL PROTEIN: 6.9 g/dL (ref 6.4–8.3)

## 2016-06-29 LAB — CBC WITH DIFFERENTIAL/PLATELET
BASO%: 1.5 % (ref 0.0–2.0)
Basophils Absolute: 0.1 10*3/uL (ref 0.0–0.1)
EOS ABS: 0.3 10*3/uL (ref 0.0–0.5)
EOS%: 4 % (ref 0.0–7.0)
HCT: 35.3 % (ref 34.8–46.6)
HGB: 11.7 g/dL (ref 11.6–15.9)
LYMPH#: 2.3 10*3/uL (ref 0.9–3.3)
LYMPH%: 29.3 % (ref 14.0–49.7)
MCH: 28.8 pg (ref 25.1–34.0)
MCHC: 33.1 g/dL (ref 31.5–36.0)
MCV: 86.9 fL (ref 79.5–101.0)
MONO#: 0.4 10*3/uL (ref 0.1–0.9)
MONO%: 5.6 % (ref 0.0–14.0)
NEUT%: 59.6 % (ref 38.4–76.8)
NEUTROS ABS: 4.6 10*3/uL (ref 1.5–6.5)
Platelets: 270 10*3/uL (ref 145–400)
RBC: 4.06 10*6/uL (ref 3.70–5.45)
RDW: 16.8 % — AB (ref 11.2–14.5)
WBC: 7.7 10*3/uL (ref 3.9–10.3)

## 2016-06-29 MED ORDER — SODIUM CHLORIDE 0.9 % IV SOLN
80.0000 mg/m2 | Freq: Once | INTRAVENOUS | Status: AC
  Administered 2016-06-29: 186 mg via INTRAVENOUS
  Filled 2016-06-29: qty 31

## 2016-06-29 MED ORDER — DIPHENHYDRAMINE HCL 50 MG/ML IJ SOLN
INTRAMUSCULAR | Status: AC
  Filled 2016-06-29: qty 1

## 2016-06-29 MED ORDER — SODIUM CHLORIDE 0.9 % IV SOLN
10.0000 mg | Freq: Once | INTRAVENOUS | Status: AC
  Administered 2016-06-29: 10 mg via INTRAVENOUS
  Filled 2016-06-29: qty 1

## 2016-06-29 MED ORDER — FAMOTIDINE IN NACL 20-0.9 MG/50ML-% IV SOLN
INTRAVENOUS | Status: AC
  Filled 2016-06-29: qty 50

## 2016-06-29 MED ORDER — DIPHENHYDRAMINE HCL 50 MG/ML IJ SOLN
25.0000 mg | Freq: Once | INTRAMUSCULAR | Status: AC
  Administered 2016-06-29: 25 mg via INTRAVENOUS

## 2016-06-29 MED ORDER — HEPARIN SOD (PORK) LOCK FLUSH 100 UNIT/ML IV SOLN
500.0000 [IU] | Freq: Once | INTRAVENOUS | Status: AC | PRN
  Administered 2016-06-29: 500 [IU]
  Filled 2016-06-29: qty 5

## 2016-06-29 MED ORDER — FAMOTIDINE IN NACL 20-0.9 MG/50ML-% IV SOLN
20.0000 mg | Freq: Once | INTRAVENOUS | Status: AC
  Administered 2016-06-29: 20 mg via INTRAVENOUS

## 2016-06-29 MED ORDER — SODIUM CHLORIDE 0.9 % IV SOLN
Freq: Once | INTRAVENOUS | Status: AC
  Administered 2016-06-29: 13:00:00 via INTRAVENOUS

## 2016-06-29 MED ORDER — SODIUM CHLORIDE 0.9% FLUSH
10.0000 mL | INTRAVENOUS | Status: DC | PRN
  Administered 2016-06-29: 10 mL
  Filled 2016-06-29: qty 10

## 2016-06-29 NOTE — Patient Instructions (Signed)
[facility] Discharge Instructions for Patients Receiving Chemotherapy  Today you received the following chemotherapy agents Taxol   To help prevent nausea and vomiting after your treatment, we encourage you to take your nausea medication as directed.   If you develop nausea and vomiting that is not controlled by your nausea medication, call the clinic.   BELOW ARE SYMPTOMS THAT SHOULD BE REPORTED IMMEDIATELY:  *FEVER GREATER THAN 100.5 F  *CHILLS WITH OR WITHOUT FEVER  NAUSEA AND VOMITING THAT IS NOT CONTROLLED WITH YOUR NAUSEA MEDICATION  *UNUSUAL SHORTNESS OF BREATH  *UNUSUAL BRUISING OR BLEEDING  TENDERNESS IN MOUTH AND THROAT WITH OR WITHOUT PRESENCE OF ULCERS  *URINARY PROBLEMS  *BOWEL PROBLEMS  UNUSUAL RASH Items with * indicate a potential emergency and should be followed up as soon as possible.  Feel free to call the clinic you have any questions or concerns. The clinic phone number is [phone].  Please show the CHEMO ALERT CARD at check-in to the Emergency Department and triage nurse.   

## 2016-06-30 ENCOUNTER — Encounter: Payer: Self-pay | Admitting: *Deleted

## 2016-06-30 NOTE — Progress Notes (Signed)
[place] Work  Clinical Social Work was referred by nurse for assessment of psychosocial needs.  Clinical Social Worker contacted patient at home to offer support and assess for needs. CSW introduced self and explained role of CSW/Support Team. [place] educated pt about programs and options for support and financial assistance. Pt reports to be doing well and declines assistance currently. Pt agrees to reach out as needed.   Clinical Social Work interventions: Education   Loren Racer, [place] Clinical Social Worker [place]  [place] Phone: [phone] Fax: [phone]

## 2016-07-05 ENCOUNTER — Other Ambulatory Visit: Payer: Self-pay | Admitting: Hematology and Oncology

## 2016-07-06 ENCOUNTER — Ambulatory Visit (HOSPITAL_BASED_OUTPATIENT_CLINIC_OR_DEPARTMENT_OTHER): Payer: Commercial Managed Care - PPO

## 2016-07-06 ENCOUNTER — Ambulatory Visit (HOSPITAL_BASED_OUTPATIENT_CLINIC_OR_DEPARTMENT_OTHER): Payer: Commercial Managed Care - PPO | Admitting: Hematology and Oncology

## 2016-07-06 ENCOUNTER — Encounter: Payer: Self-pay | Admitting: Hematology and Oncology

## 2016-07-06 ENCOUNTER — Other Ambulatory Visit (HOSPITAL_BASED_OUTPATIENT_CLINIC_OR_DEPARTMENT_OTHER): Payer: Commercial Managed Care - PPO

## 2016-07-06 VITALS — BP 111/76 | HR 91 | Temp 98.6°F | Resp 18 | Ht 66.0 in | Wt 240.2 lb

## 2016-07-06 DIAGNOSIS — C50211 Malignant neoplasm of upper-inner quadrant of right female breast: Secondary | ICD-10-CM

## 2016-07-06 DIAGNOSIS — T451X5A Adverse effect of antineoplastic and immunosuppressive drugs, initial encounter: Secondary | ICD-10-CM | POA: Insufficient documentation

## 2016-07-06 DIAGNOSIS — I427 Cardiomyopathy due to drug and external agent: Secondary | ICD-10-CM

## 2016-07-06 DIAGNOSIS — Z5111 Encounter for antineoplastic chemotherapy: Secondary | ICD-10-CM | POA: Diagnosis not present

## 2016-07-06 DIAGNOSIS — Z170 Estrogen receptor positive status [ER+]: Secondary | ICD-10-CM | POA: Diagnosis not present

## 2016-07-06 LAB — CBC WITH DIFFERENTIAL/PLATELET
BASO%: 1.3 % (ref 0.0–2.0)
Basophils Absolute: 0.1 10*3/uL (ref 0.0–0.1)
EOS ABS: 0.2 10*3/uL (ref 0.0–0.5)
EOS%: 3.9 % (ref 0.0–7.0)
HEMATOCRIT: 34.9 % (ref 34.8–46.6)
HEMOGLOBIN: 11.5 g/dL — AB (ref 11.6–15.9)
LYMPH#: 1.9 10*3/uL (ref 0.9–3.3)
LYMPH%: 31.2 % (ref 14.0–49.7)
MCH: 28.9 pg (ref 25.1–34.0)
MCHC: 32.9 g/dL (ref 31.5–36.0)
MCV: 87.8 fL (ref 79.5–101.0)
MONO#: 0.4 10*3/uL (ref 0.1–0.9)
MONO%: 5.8 % (ref 0.0–14.0)
NEUT%: 57.8 % (ref 38.4–76.8)
NEUTROS ABS: 3.5 10*3/uL (ref 1.5–6.5)
PLATELETS: 242 10*3/uL (ref 145–400)
RBC: 3.98 10*6/uL (ref 3.70–5.45)
RDW: 17.1 % — AB (ref 11.2–14.5)
WBC: 6.1 10*3/uL (ref 3.9–10.3)

## 2016-07-06 LAB — COMPREHENSIVE METABOLIC PANEL
ALBUMIN: 3.5 g/dL (ref 3.5–5.0)
ALK PHOS: 66 U/L (ref 40–150)
ALT: 18 U/L (ref 0–55)
AST: 17 U/L (ref 5–34)
Anion Gap: 9 mEq/L (ref 3–11)
BILIRUBIN TOTAL: 0.61 mg/dL (ref 0.20–1.20)
BUN: 16.5 mg/dL (ref 7.0–26.0)
CALCIUM: 9.5 mg/dL (ref 8.4–10.4)
CO2: 21 mEq/L — ABNORMAL LOW (ref 22–29)
Chloride: 111 mEq/L — ABNORMAL HIGH (ref 98–109)
Creatinine: 1 mg/dL (ref 0.6–1.1)
EGFR: 69 mL/min/{1.73_m2} — AB (ref >90)
GLUCOSE: 129 mg/dL (ref 70–140)
Potassium: 4.3 mEq/L (ref 3.5–5.1)
SODIUM: 141 meq/L (ref 136–145)
TOTAL PROTEIN: 6.7 g/dL (ref 6.4–8.3)

## 2016-07-06 MED ORDER — ACETAMINOPHEN 325 MG PO TABS: 650.0000 mg | ORAL_TABLET | Freq: Once | ORAL | Status: DC

## 2016-07-06 MED ORDER — SODIUM CHLORIDE 0.9 % IV SOLN
10.0000 mg | Freq: Once | INTRAVENOUS | Status: AC
  Administered 2016-07-06: 10 mg via INTRAVENOUS
  Filled 2016-07-06: qty 1

## 2016-07-06 MED ORDER — FAMOTIDINE IN NACL 20-0.9 MG/50ML-% IV SOLN
INTRAVENOUS | Status: AC
  Filled 2016-07-06: qty 50

## 2016-07-06 MED ORDER — DIPHENHYDRAMINE HCL 50 MG/ML IJ SOLN
INTRAMUSCULAR | Status: AC
  Filled 2016-07-06: qty 1

## 2016-07-06 MED ORDER — FAMOTIDINE IN NACL 20-0.9 MG/50ML-% IV SOLN
20.0000 mg | Freq: Once | INTRAVENOUS | Status: AC
Start: 2016-07-06 — End: 2016-07-06
  Administered 2016-07-06: 20 mg via INTRAVENOUS

## 2016-07-06 MED ORDER — DIPHENHYDRAMINE HCL 50 MG/ML IJ SOLN
25.0000 mg | Freq: Once | INTRAMUSCULAR | Status: AC
  Administered 2016-07-06: 25 mg via INTRAVENOUS

## 2016-07-06 MED ORDER — SODIUM CHLORIDE 0.9 % IV SOLN
80.0000 mg/m2 | Freq: Once | INTRAVENOUS | Status: AC
  Administered 2016-07-06: 186 mg via INTRAVENOUS
  Filled 2016-07-06: qty 31

## 2016-07-06 MED ORDER — HEPARIN SOD (PORK) LOCK FLUSH 100 UNIT/ML IV SOLN
500.0000 [IU] | Freq: Once | INTRAVENOUS | Status: AC | PRN
  Administered 2016-07-06: 500 [IU]
  Filled 2016-07-06: qty 5

## 2016-07-06 MED ORDER — SODIUM CHLORIDE 0.9 % IV SOLN
Freq: Once | INTRAVENOUS | Status: AC
  Administered 2016-07-06: 10:00:00 via INTRAVENOUS

## 2016-07-06 MED ORDER — SODIUM CHLORIDE 0.9% FLUSH
10.0000 mL | INTRAVENOUS | Status: DC | PRN
  Administered 2016-07-06: 10 mL
  Filled 2016-07-06: qty 10

## 2016-07-06 MED ORDER — TRASTUZUMAB CHEMO 150 MG IV SOLR: 2.0000 mg/kg | Freq: Once | INTRAVENOUS | Status: DC

## 2016-07-06 NOTE — Patient Instructions (Signed)
[facility] Discharge Instructions for Patients Receiving Chemotherapy  Today you received the following chemotherapy agents Taxol   To help prevent nausea and vomiting after your treatment, we encourage you to take your nausea medication as directed.   If you develop nausea and vomiting that is not controlled by your nausea medication, call the clinic.   BELOW ARE SYMPTOMS THAT SHOULD BE REPORTED IMMEDIATELY:  *FEVER GREATER THAN 100.5 F  *CHILLS WITH OR WITHOUT FEVER  NAUSEA AND VOMITING THAT IS NOT CONTROLLED WITH YOUR NAUSEA MEDICATION  *UNUSUAL SHORTNESS OF BREATH  *UNUSUAL BRUISING OR BLEEDING  TENDERNESS IN MOUTH AND THROAT WITH OR WITHOUT PRESENCE OF ULCERS  *URINARY PROBLEMS  *BOWEL PROBLEMS  UNUSUAL RASH Items with * indicate a potential emergency and should be followed up as soon as possible.  Feel free to call the clinic you have any questions or concerns. The clinic phone number is [phone].  Please show the CHEMO ALERT CARD at check-in to the Emergency Department and triage nurse.   

## 2016-07-06 NOTE — Assessment & Plan Note (Signed)
Right lumpectomy 03/01/2016: ILC, grade 2, 0.8 cm, ALH, additional superior margin : ILC grade to 0.3 cm , positive superior margin , 0/1 LN neg, mammoplasty bil : benign, ER 95%, PR 95%, HER-2 positive on primary tumor and negative on secondary mass which had a ratio of 1.55, gene copy number is 2.25, Ki-67 5%, T1bN0 stage IA  HER-2: HER-2 was positive in the original tumor and the HER-2 on the second excision was negative. I was able to get this clarification from pathology.  Reexcision of superior margin: 5 2017: Benign  Treatment plan: 1. Adjuvant therapy with Taxol and Herceptin weekly 12 followed by Herceptin maintenance every 3 weeks 1 year 2. Follow-up adjuvant radiation followed by 3. Antiestrogen therapy ----------------------------------------------------------------------------------------------------------------------------- Current treatment: Cycle 12 weekly Taxol Herceptin Echocardiogram 03/24/2016: EF 60-65%  Chemotherapy toxicities: 1. Flushing sensation 2. Alopecia 3. Herceptin related decreased cardiac ejection fraction from 60-65% to 50%. Herceptin was on hold for improvement in her cardiac function. Patient was started on Coreg.  Patient expressed concern about holding Herceptin back. I emphasized that it is important to hold Herceptin in this setting because of risks to her heart and that it can be resumed once it improves. Coreg is essential to help improve the cardiac function. I also stressed the fact that majority of times we are able to resume Herceptin after a short break with improvement in the cardiac ejection fraction.  Monitoring closely for chemotherapy toxicities. Labs were reviewed  We will make an appointment for the patient to see radiation oncology. Continue with Herceptin once cleared by cardiology.  Return to clinic after radiation therapy to start antiestrogen therapy.

## 2016-07-06 NOTE — Progress Notes (Signed)
Patient Care Team: [name], MD as PCP - General (Obstetrics and Gynecology)  SUMMARY OF ONCOLOGIC HISTORY:   Breast cancer of upper-inner quadrant of right female breast ([place])   01/21/2016 Initial Diagnosis    Right breast biopsy: Invasive lobular cancer, ER 95%, PR 95%, Ki-67 5%, HER-2 positive ratio 2.52      02/01/2016 Breast MRI    Right breast: Post biopsy hematoma measuring 1.9 x 1.3 x 2.5 cm surrounding ring of reactive enhancement, no lymphadenopathy. T2 N0 stage II a clinical stage      03/01/2016 Surgery    Right lumpectomy: ILC, grade 2, 0.8 cm, ALH, additional superior margin : ILC grade to 0.3 cm , positive superior margin , 0/1 LN neg, mammoplasty bil : benign, ER 95%, PR 95%, HER-2 pos, Ki-67 5%, T1bN0 stage IA      03/15/2016 Surgery    Right breast. Margin excision: Benign      04/20/2016 -  Chemotherapy    Taxol Herceptin weekly 12 followed by Herceptin maintenance for 1 year       CHIEF COMPLIANT: Follow-up on cycle 12 Taxol, Herceptin on hold  INTERVAL HISTORY: CLEA Janet King is a 50 year old with above-mentioned history of right breast cancer currently on adjuvant Taxol and Herceptin. Today is cycle 12 Taxol. Herceptin has been on hold since last week because of decline in ejection fraction. Patient was very anxious about stopping Herceptin. Her energy level stent of decline over the weekend. Her taste has not fully recovered. She has not had any problems from [place].  REVIEW OF SYSTEMS:   Constitutional: Denies fevers, chills or abnormal weight loss, fatigue from chemotherapy Eyes: Denies blurriness of vision Ears, nose, mouth, throat, and face: Denies mucositis or sore throat Respiratory: Denies cough, dyspnea or wheezes Cardiovascular: Denies palpitation, chest discomfort Gastrointestinal:  Denies nausea, heartburn or change in bowel habits Skin: Denies abnormal skin rashes Lymphatics: Denies new lymphadenopathy or easy  bruising Neurological:Denies numbness, tingling or new weaknesses Behavioral/Psych: Mood is stable, no new changes  Extremities: No lower extremity edema  All other systems were reviewed with the patient and are negative.  I have reviewed the past medical history, past surgical history, social history and family history with the patient and they are unchanged from previous note.  ALLERGIES:  is allergic to penicillin g.  MEDICATIONS:  Current Outpatient Prescriptions  Medication Sig Dispense Refill  . carvedilol (COREG) 3.125 MG tablet Take 1 tablet (3.125 mg total) by mouth 2 (two) times daily. 180 tablet 3  . lidocaine-prilocaine (EMLA) cream Apply to affected area once 30 g 3  . SYNTHROID 112 MCG tablet   0  . Vitamin D, Ergocalciferol, (DRISDOL) 50000 units CAPS capsule Take 50,000 Units by mouth every 7 (seven) days.     No current facility-administered medications for this visit.    Facility-Administered Medications Ordered in Other Visits  Medication Dose Route Frequency Provider Last Rate Last Dose  . 0.9 %  sodium chloride infusion   Intravenous Once Nicholas Lose, MD      . acetaminophen (TYLENOL) tablet 650 mg  650 mg Oral Once Nicholas Lose, MD      . dexamethasone (DECADRON) 10 mg in sodium chloride 0.9 % 50 mL IVPB  10 mg Intravenous Once Nicholas Lose, MD      . diphenhydrAMINE (BENADRYL) injection 25 mg  25 mg Intravenous Once Nicholas Lose, MD      . famotidine (PEPCID) IVPB 20 mg premix  20 mg Intravenous Once [employer]  Lindi Adie, MD      . heparin lock flush 100 unit/mL  500 Units Intracatheter Once PRN Nicholas Lose, MD      . PACLitaxel (TAXOL) 186 mg in dextrose 5 % 250 mL chemo infusion (</= 50m/m2)  80 mg/m2 (Treatment Plan Recorded) Intravenous Once VNicholas Lose MD      . sodium chloride flush (NS) 0.9 % injection 10 mL  10 mL Intracatheter PRN VNicholas Lose MD      . trastuzumab (HERCEPTIN) 231 mg in sodium chloride 0.9 % 250 mL chemo infusion  2 mg/kg (Treatment Plan  Recorded) Intravenous Once VNicholas Lose MD        PHYSICAL EXAMINATION: ECOG PERFORMANCE STATUS: 1 - Symptomatic but completely ambulatory  Vitals:   07/06/16 0822  BP: 111/76  Pulse: 91  Resp: 18  Temp: 98.6 F (37 C)   Filed Weights   07/06/16 07681 Weight: 240 lb 3.2 oz (109 kg)    GENERAL:alert, no distress and comfortable SKIN: skin color, texture, turgor are normal, no rashes or significant lesions EYES: normal, Conjunctiva are pink and non-injected, sclera clear OROPHARYNX:no exudate, no erythema and lips, buccal mucosa, and tongue normal  NECK: supple, thyroid normal size, non-tender, without nodularity LYMPH:  no palpable lymphadenopathy in the cervical, axillary or inguinal LUNGS: clear to auscultation and percussion with normal breathing effort HEART: regular rate & rhythm and no murmurs and no lower extremity edema ABDOMEN:abdomen soft, non-tender and normal bowel sounds MUSCULOSKELETAL:no cyanosis of digits and no clubbing  NEURO: alert & oriented x 3 with fluent speech, no focal motor/sensory deficits EXTREMITIES: No lower extremity edema  LABORATORY DATA:  I have reviewed the data as listed   Chemistry      Component Value Date/Time   NA 141 07/06/2016 0808   K 4.3 07/06/2016 0808   CL 111 04/17/2007 0925   CO2 21 (L) 07/06/2016 0808   BUN 16.5 07/06/2016 0808   CREATININE 1.0 07/06/2016 0808      Component Value Date/Time   CALCIUM 9.5 07/06/2016 0808   ALKPHOS 66 07/06/2016 0808   AST 17 07/06/2016 0808   ALT 18 07/06/2016 0808   BILITOT 0.61 07/06/2016 0808       Lab Results  Component Value Date   WBC 6.1 07/06/2016   HGB 11.5 (L) 07/06/2016   HCT 34.9 07/06/2016   MCV 87.8 07/06/2016   PLT 242 07/06/2016   NEUTROABS 3.5 07/06/2016     ASSESSMENT & PLAN:  Breast cancer of upper-inner quadrant of right female breast ([place] Right lumpectomy 03/01/2016: ILC, grade 2, 0.8 cm, ALH, additional superior margin : ILC grade to 0.3 cm ,  positive superior margin , 0/1 LN neg, mammoplasty bil : benign, ER 95%, PR 95%, HER-2 positive on primary tumor and negative on secondary mass which had a ratio of 1.55, gene copy number is 2.25, Ki-67 5%, T1bN0 stage IA  HER-2: HER-2 was positive in the original tumor and the HER-2 on the second excision was negative. I was able to get this clarification from pathology.  Reexcision of superior margin: 5 2017: Benign  Treatment plan: 1. Adjuvant therapy with Taxol and Herceptin weekly 12 followed by Herceptin maintenance every 3 weeks 1 year 2. Follow-up adjuvant radiation followed by 3. Antiestrogen therapy ----------------------------------------------------------------------------------------------------------------------------- Current treatment: Cycle 12 weekly Taxol Herceptin Echocardiogram 03/24/2016: EF 60-65%  Chemotherapy toxicities: 1. Flushing sensation 2. Alopecia 3. Herceptin related decreased cardiac ejection fraction from 60-65% to 50%. Herceptin was on hold for improvement in  her cardiac function. Patient was started on Coreg.  Patient expressed concern about holding Herceptin back. I emphasized that it is important to hold Herceptin in this setting because of risks to her heart and that it can be resumed once it improves. Coreg is essential to help improve the cardiac function. I also stressed the fact that majority of times we are able to resume Herceptin after a short break with improvement in the cardiac ejection fraction.  Monitoring closely for chemotherapy toxicities. Labs were reviewed  We will make an appointment for the patient to see radiation oncology. Continue with Herceptin once cleared by cardiology. Next echocardiogram will be done on 08/02/2016. I plan to start back on Herceptin on 08/08/2016 if her echocardiogram has recovered and cleared by cardiology.  Return to clinic after radiation therapy to start antiestrogen therapy.   No orders of the  defined types were placed in this encounter.  The patient has a good understanding of the overall plan. she agrees with it. she will call with any problems that may develop before the next visit here.   Rulon Eisenmenger, MD 07/06/16

## 2016-07-07 ENCOUNTER — Telehealth: Payer: Self-pay | Admitting: *Deleted

## 2016-07-07 NOTE — Telephone Encounter (Signed)
Left vm to congratulate pt on completion of chemo and to assess needs. Contact information provided.

## 2016-07-11 NOTE — Progress Notes (Signed)
Location of Breast Cancer: right breast cancer  Histology per Pathology Report:   03/15/16 Diagnosis Breast, excision, right breast superior margin - BENIGN BREAST TISSUE WITH FAT NECROSIS. - NO ATYPIA OR TUMOR SEEN. - SEE COMMENT.  03/01/16 Diagnosis 1. Breast, lumpectomy, Right - INVASIVE LOBULAR CARCINOMA, GRADE 2, SPANNING 0.8 CM. - LOBULAR NEOPLASIA (ATYPICAL LOBULAR HYPERPLASIA). - RESECTION MARGINS ARE NEGATIVE FOR CARCINOMA. - EXTENSIVE BIOPSY SITE CHANGE. - SEE ONCOLOGY TABLE. 2. Breast, excision, Right Breast additional superior margin - INVASIVE LOBULAR CARCINOMA, GRADE 2, SPANNING 0.3 CM. - CARCINOMA IS PRESENT AT THE NEW SUPERIOR MARGIN. - SEE ONCOLOGY TABLE. 3. Breast, excision, Right Breast additional posterior margin - BENIGN BREAST TISSUE. - NO MALIGNANCY IDENTIFIED. 4. Lymph node, sentinel, biopsy, Right Axillary - ONE OF ONE LYMPH NODES NEGATIVE FOR CARCINOMA (0/1). 5. Breast, Mammoplasty, Left Breast Tissue - FOCAL LOBULAR NEOPLASIA (ATYPICAL LOBULAR HYPERPLASIA). - FIBROCYSTIC CHANGE. 6. Breast, Mammoplasty, Right Breast Tissue - FIBROCYSTIC CHANGE. - NO ATYPIA OR MALIGNANCY IDENTIFIED.  01/21/16 Diagnosis Breast, right, needle core biopsy, medial central breast INVASIVE LOBULAR CARCINOMA  Receptor Status: from 03/01/16 path: ER (60%), PR (60%), Her2-neu (negative), Ki67 (5%), 01/21/16 path: ER(95%), PR (95%), Her2-neu (positive)  Did patient present with symptoms (if so, please note symptoms) or was this found on screening mammography?: screening mammography  Past/Anticipated interventions by surgeon, if any:  03/15/16 - Procedure: RE-EXCISION OF BREAST CANCER, SUPERIOR MARGINS;  Surgeon: Rolm Bookbinder, MD, 03/01/16 -Procedure: RADIOACTIVE SEED GUIDED PARTIAL MASTECTOMY WITH AXILLARY SENTINEL LYMPH NODE BIOPSY;  Surgeon: Rolm Bookbinder, MD and Procedure: RIGHT ONCOPLASTIC BREAST RECONSTRUCTION WITH LEFT REDUCTION FOR SYMMETRY;  Surgeon: Irene Limbo,  MD  Past/Anticipated interventions by medical oncology, if any: Adjuvant therapy with Taxol and Herceptin weekly 12 followed by Herceptin maintenance every 3 weeks 1 year  Lymphedema issues, if any:  no  Pain issues, if any:  no   OB GYN history: She menarched at age of 40. She had no pregnancies. She has not received birth control pills. She was never exposed to fertility medications or hormone replacement therapy. She has family history of Breast/GYN/GI cancer She has a mother age 37 with breast cancer.  SAFETY ISSUES:  Prior radiation? no  Pacemaker/ICD? no  Possible current pregnancy?no  Is the patient on methotrexate? no  Current Complaints / other details:   BP 133/82 (BP Location: Left Arm, Patient Position: Sitting)   Pulse (!) 107   Temp 98.5 F (36.9 C) (Oral)   Ht 5' 6" (1.676 m)   Wt 239 lb 14.4 oz (108.8 kg)   SpO2 100%   BMI 38.72 kg/m    Wt Readings from Last 3 Encounters:  07/13/16 239 lb 14.4 oz (108.8 kg)  07/06/16 240 lb 3.2 oz (109 kg)  06/27/16 240 lb 8 oz (109.1 kg)

## 2016-07-13 ENCOUNTER — Ambulatory Visit
Admission: RE | Admit: 2016-07-13 | Discharge: 2016-07-13 | Disposition: A | Discharge status: Home / Self Care | Payer: Commercial Managed Care - PPO | Source: Ambulatory Visit | Attending: Radiation Oncology | Admitting: Radiation Oncology

## 2016-07-13 ENCOUNTER — Encounter: Payer: Self-pay | Admitting: Radiation Oncology

## 2016-07-13 DIAGNOSIS — E039 Hypothyroidism, unspecified: Secondary | ICD-10-CM | POA: Diagnosis not present

## 2016-07-13 DIAGNOSIS — C50911 Malignant neoplasm of unspecified site of right female breast: Secondary | ICD-10-CM | POA: Insufficient documentation

## 2016-07-13 DIAGNOSIS — Z170 Estrogen receptor positive status [ER+]: Secondary | ICD-10-CM | POA: Insufficient documentation

## 2016-07-13 DIAGNOSIS — C50211 Malignant neoplasm of upper-inner quadrant of right female breast: Secondary | ICD-10-CM

## 2016-07-13 NOTE — Progress Notes (Signed)
Please see the Nurse Progress Note in the MD Initial Consult Encounter for this patient. 

## 2016-07-13 NOTE — Progress Notes (Signed)
Radiation Oncology         [phone] ________________________________  Name: Janet King MRN: [id number]  Date: 07/13/2016  DOB: 1965/12/25  Re-Evaluation Visit Note  CC: [name], MD  Nicholas Lose, MD    ICD-9-CM ICD-10-CM   1. Breast cancer of upper-inner quadrant of right female breast ([place]) 174.2 C50.211     Diagnosis: Stage IA (pT1b, pN0) invasive lobular carcinoma of the right breast (triple positive)  Interval Since Last Radiation: N/A  Narrative:  The patient returns today for a re-evaluation. The patient had an initial consultation with radiation oncology on 02/04/16.  The patient had a right lumectomy and sentinel lymph node biopsy on 03/01/16. This revealed grade 2 ILC (spanning 0.8 cm) and lobular neoplasia. Additional excision of the superior margin revealed grade 2 ILC (spanning 0.3 cm) and the carcinoma was present at the new superior margin. Mammoplasty of left breast tissue noted focal lobular neoplasia and fibrocystic change. Mammoplasty of right breast tissue noted fibrocystic change and no atypia or malignancy. Biopsy of a right axillary lymph node was negative for carcinoma.  The patient had a re-excision of the superior margin of the right breast revealing benign breast tissue with fat necrosis.  The patient started adjuvant chemotherapy with Taxol and Herceptin weekly x12 on 04/20/16, but Herceptin is on hold due to a decline in ejection fraction. She completed Taxol x12 on 07/06/16.  The patient returns today to discuss the role of radiation in the management of her disease.  ALLERGIES:  is allergic to penicillin g.  Meds: Current Outpatient Prescriptions  Medication Sig Dispense Refill  . carvedilol (COREG) 3.125 MG tablet Take 1 tablet (3.125 mg total) by mouth 2 (two) times daily. 180 tablet 3  . cholecalciferol (VITAMIN D) 1000 units tablet Take 1,000 Units by mouth daily.    Marland Kitchen lidocaine-prilocaine (EMLA) cream Apply to affected area once 30  g 3  . SYNTHROID 112 MCG tablet   0  . Vitamin D, Ergocalciferol, (DRISDOL) 50000 units CAPS capsule Take 50,000 Units by mouth every 7 (seven) days.     No current facility-administered medications for this encounter.     Physical Findings: The patient is in no acute distress. Patient is alert and oriented.  height is 5\' 6"  (1.676 m) and weight is 239 lb 14.4 oz (108.8 kg). Her oral temperature is 98.5 F (36.9 C). Her blood pressure is 133/82 and her pulse is 107 (abnormal). Her oxygen saturation is 100%.   Lungs are clear to auscultation bilaterally. Heart has regular rate and rhythm. No palpable cervical, supraclavicular, or axillary adenopathy. Left breast scars along the inferior breast from a reduction mammoplasty. Some erythema along the inferior left breast, no obvious infection. Right breast shows scars from a reduction mammoplasty. Some erythema along the inferior and central breast with no sign of infection. No drainage noted from the right breast.  Lab Findings: Lab Results  Component Value Date   WBC 6.1 07/06/2016   HGB 11.5 (L) 07/06/2016   HCT 34.9 07/06/2016   MCV 87.8 07/06/2016   PLT 242 07/06/2016    Radiographic Findings: No results found.  Impression:  Stage IA (pT1b, pN0) invasive lobular carcinoma of the right breast (triple positive)  She would be a good candidate for breast conservation with radiation directed to the right breast. Since her re-excision was negative, I would not recommend a boost and she would receive 28 fractions to the right breast. She would not be a candidate for hypofractionated treatment  given her large breast size.  Plan: CT simulation will be scheduled next week with treatment to begin approximately 2 weeks from now assuming this is fine with Dr. Iran Planas since the patient has had wound healing issues on the right side. She is scheduled to see Dr. Iran Planas  tomorrow. ____________________________________ -----------------------------------  Blair Promise, PhD, MD  This document serves as a record of services personally performed by Gery Pray, MD. It was created on his behalf by Darcus Austin, a trained medical scribe. The creation of this record is based on the scribe's personal observations and the provider's statements to them. This document has been checked and approved by the attending provider.

## 2016-07-21 ENCOUNTER — Ambulatory Visit
Admission: RE | Admit: 2016-07-21 | Discharge: 2016-07-21 | Disposition: A | Discharge status: Home / Self Care | Payer: Commercial Managed Care - PPO | Source: Ambulatory Visit | Attending: Radiation Oncology | Admitting: Radiation Oncology

## 2016-08-02 ENCOUNTER — Ambulatory Visit
Admission: RE | Admit: 2016-08-02 | Discharge: 2016-08-02 | Disposition: A | Discharge status: Home / Self Care | Payer: Commercial Managed Care - PPO | Source: Ambulatory Visit | Attending: Radiation Oncology | Admitting: Radiation Oncology

## 2016-08-02 ENCOUNTER — Encounter (HOSPITAL_COMMUNITY): Payer: Self-pay

## 2016-08-02 ENCOUNTER — Ambulatory Visit (HOSPITAL_BASED_OUTPATIENT_CLINIC_OR_DEPARTMENT_OTHER)
Admission: RE | Admit: 2016-08-02 | Discharge: 2016-08-02 | Disposition: A | Discharge status: Home / Self Care | Payer: Commercial Managed Care - PPO | Source: Ambulatory Visit | Attending: Cardiology | Admitting: Cardiology

## 2016-08-02 ENCOUNTER — Ambulatory Visit (HOSPITAL_COMMUNITY)
Admission: RE | Admit: 2016-08-02 | Discharge: 2016-08-02 | Disposition: A | Discharge status: Home / Self Care | Payer: Commercial Managed Care - PPO | Source: Ambulatory Visit | Attending: Internal Medicine | Admitting: Internal Medicine

## 2016-08-02 VITALS — BP 120/82 | HR 94 | Wt 238.8 lb

## 2016-08-02 DIAGNOSIS — Z880 Allergy status to penicillin: Secondary | ICD-10-CM | POA: Diagnosis not present

## 2016-08-02 DIAGNOSIS — C50211 Malignant neoplasm of upper-inner quadrant of right female breast: Secondary | ICD-10-CM

## 2016-08-02 DIAGNOSIS — Z170 Estrogen receptor positive status [ER+]: Secondary | ICD-10-CM | POA: Diagnosis not present

## 2016-08-02 DIAGNOSIS — I427 Cardiomyopathy due to drug and external agent: Secondary | ICD-10-CM

## 2016-08-02 DIAGNOSIS — C50911 Malignant neoplasm of unspecified site of right female breast: Secondary | ICD-10-CM | POA: Diagnosis present

## 2016-08-02 DIAGNOSIS — T451X5A Adverse effect of antineoplastic and immunosuppressive drugs, initial encounter: Secondary | ICD-10-CM

## 2016-08-02 DIAGNOSIS — E669 Obesity, unspecified: Secondary | ICD-10-CM | POA: Insufficient documentation

## 2016-08-02 DIAGNOSIS — Z6838 Body mass index (BMI) 38.0-38.9, adult: Secondary | ICD-10-CM | POA: Diagnosis not present

## 2016-08-02 DIAGNOSIS — M7120 Synovial cyst of popliteal space [Baker], unspecified knee: Secondary | ICD-10-CM | POA: Diagnosis not present

## 2016-08-02 DIAGNOSIS — E039 Hypothyroidism, unspecified: Secondary | ICD-10-CM | POA: Insufficient documentation

## 2016-08-02 LAB — ECHOCARDIOGRAM LIMITED: Weight: 3820 oz

## 2016-08-02 MED ORDER — LOSARTAN POTASSIUM 25 MG PO TABS: 25.0000 mg | ORAL_TABLET | Freq: Every day | ORAL | 6 refills | Status: DC

## 2016-08-02 NOTE — Progress Notes (Signed)
Patient ID: TUYEN UNCAPHER, female   DOB: [birth year], 50 y.o.   MRN: [id number]    Cardio-Oncology Clinic Consult Note   Referring Physician: Dr Lindi Adie Primary Care: Primary Cardiologist: New   HPI:  Janet King is a 50 y.o. female with cancer of the right female breast diagnosed 3/17 referred by Dr. Lindi Adie for enrollment into the cardio-oncology clinic.   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 T2 N0 stage II a clinical stage  03/01/2016 Surgery Right lumpectomy: ILC, grade 2, 0.8 cm, ALH, additional superior margin : ILC grade to 0.3 cm , positive superior margin , 0/1 LN neg, mammoplasty bil : benign, ER 95%, PR 95%, HER-2 pos, Ki-67 5%, T1bN0 stage IA   She started chemotherapy including Herceptin in 5/17.   Overall doing well. At last visit Herceptin held due to question of early cardiac toxicity. Carvedilol 3.125 bid started. Denies SOB or dizziness. No edema. Doesn't like to take meds.   Echo 03/24/16 LVEF 65-70%, Ls' 12.0, GLS -19.3% Echo 06/27/16 LVEF 50%, lateral s' 7, GLS -17.2% Echo  08/02/16 60-65%  Lateral s' 11.2 GLS -18.0%     Review of Systems: All systems reviewed and negative except as per HPI.   Past Medical History:  Diagnosis Date  . Baker's cyst of knee   . Breast cancer ([place])    right  . H/O bone density study   . Hypothyroidism     Current Outpatient Prescriptions  Medication Sig Dispense Refill  . carvedilol (COREG) 3.125 MG tablet Take 1 tablet (3.125 mg total) by mouth 2 (two) times daily. 180 tablet 3  . cholecalciferol (VITAMIN D) 1000 units tablet Take 1,000 Units by mouth daily.    Marland Kitchen lidocaine-prilocaine (EMLA) cream Apply to affected area once 30 g 3  . SYNTHROID 112 MCG tablet    0   No current facility-administered medications for this encounter.     Allergies  Allergen Reactions  . Penicillin G Rash      Social History   Social History  . Marital status: Single    Spouse name: N/A  . Number of children: 0  . Years of education: N/A   Occupational History  . 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 Smoking status: Never Smoker  . Smokeless tobacco: Never Used  . Alcohol use 0.0 oz/week     Comment: occ - once every 3 mos  . Drug use: No  . Sexual activity: Not Currently   Other Topics Concern  . Not on file   Social History Narrative  . No narrative on file      Family History  Problem Relation Age of Onset  . Breast cancer Mother 67  . Heart disease Maternal Grandfather   . Colon cancer Paternal Grandmother     dx. 53s  . Cancer Other     maternal great aunt (MGM's sister) dx. with NOS cancer at older age  . Breast cancer Other     maternal great aunt (MGF's sister) dx. in her 43s; s/p mastectomy  . Cancer Other     paternal great aunt (PGM's sister) dx. with NOS cancer at older age    56:   08/02/16 1118  BP: 120/82  Pulse: 94  SpO2: 100%  Weight: 238 lb 12 oz (108.3 kg)    PHYSICAL EXAM: General:  Well appearing. No respiratory difficulty HEENT: normal Neck: supple. no JVD. Carotids 2+ bilat; no bruits. No lymphadenopathy or thryomegaly appreciated. Cor: PMI nondisplaced. Regular rate & rhythm. No rubs, gallops or murmurs. Lungs: clear Abdomen: Obese, soft, nontender, nondistended. No hepatosplenomegaly. No bruits or masses. Good bowel sounds. Extremities: no cyanosis, clubbing, rash, edema Neuro: alert & oriented x 3, cranial nerves grossly intact. moves all 4 extremities w/o difficulty. Affect pleasant.   Assessment/ Plan   1. Breast cancer of upper-inner quadrant of right female breast:  -- She started adjuvant therapy 5/17 with Taxol and Herceptin weekly x 12 followed by Herceptin  maintenance every 3 weeks 1 year.  -- Herceptin held at last visit due to question of early cardio-toxicity. Carvedilol strated.  -- I reviewed echo today personally. EF and Doppler parameters back to baseline. No HF on exam. Will restart Herceptin. -- Add losartan 25 daily --Repeat echo in 8 weeks after 3 more cycles.  --Discussed role of scheduled interruptions and goal of 1 year of Herceptin therapy.   Bensimhon, Daniel,MD 08/02/2016

## 2016-08-02 NOTE — Progress Notes (Signed)
  Echocardiogram 2D Echocardiogram limited has been performed.  Tresa Res 08/02/2016, 11:27 AM

## 2016-08-02 NOTE — Patient Instructions (Signed)
Start Losartan 25 mg daily  Your physician recommends that you schedule a follow-up appointment in: 8 weeks with echocardiogram

## 2016-08-02 NOTE — Progress Notes (Signed)
  Radiation Oncology         [phone] ________________________________  Name: Janet King MRN: [id number]  Date: 08/02/2016  DOB: [birth year]  SIMULATION AND TREATMENT PLANNING NOTE    ICD-9-CM ICD-10-CM   1. Breast cancer of upper-inner quadrant of right female breast ([place]) 174.2 C50.211     DIAGNOSIS:  Stage IA (pT1b, pN0) invasive lobular carcinoma of the right breast (triple positive)  NARRATIVE:  The patient was brought to the [facility].  Identity was confirmed.  All relevant records and images related to the planned course of therapy were reviewed.  The patient freely provided informed written consent to proceed with treatment after reviewing the details related to the planned course of therapy. The consent form was witnessed and verified by the simulation staff.  Then, the patient was set-up in a stable reproducible  supine position for radiation therapy.  CT images were obtained.  Surface markings were placed.  The CT images were loaded into the planning software.  Then the target and avoidance structures were contoured.  Treatment planning then occurred.  The radiation prescription was entered and confirmed.  Then, I designed and supervised the construction of a total of 3 medically necessary complex treatment devices.  I have requested : 3D Simulation  I have requested a DVH of the following structures: heart, lungs, lumpectomy cavity.  I have ordered:dose calc.  PLAN:  The patient will receive 50.4 Gy in 28 fractions. Since reexcision showed no residual cancer, a boost will not be given.  -----------------------------------  Blair Promise, PhD, MD  This document serves as a record of services personally performed by Gery Pray, MD. It was created on his behalf by Truddie Hidden, a trained medical scribe. The creation of this record is based on the scribe's personal observations and the provider's statements to them. This document has been checked and  approved by the attending provider.

## 2016-08-03 ENCOUNTER — Other Ambulatory Visit: Payer: Self-pay | Admitting: Hematology and Oncology

## 2016-08-03 ENCOUNTER — Telehealth: Payer: Self-pay | Admitting: Hematology and Oncology

## 2016-08-03 NOTE — Progress Notes (Signed)
  Radiation Oncology         [phone] ________________________________  Name: Janet King MRN: [id number]  Date: 08/02/2016  DOB: [birth year]  RESPIRATORY MOTION MANAGEMENT SIMULATION  NARRATIVE:  In order to account for effect of respiratory motion on target structures and other organs in the planning and delivery of radiotherapy, this patient underwent respiratory motion management simulation.  To accomplish this, when the patient was brought to the CT simulation planning suite, 4D respiratoy motion management CT images were obtained.  The CT images were loaded into the planning software.  Then, using a variety of tools including Cine, MIP, and standard views, the target volume and planning target volumes (PTV) were delineated.  Avoidance structures were contoured.  Treatment planning then occurred.  Dose volume histograms were generated and reviewed for each of the requested structure.  The resulting plan was carefully reviewed and approved today.

## 2016-08-03 NOTE — Telephone Encounter (Signed)
I reviewed I received a message from cardiology that we can resume treatment with Herceptin. I called and left a message for the patient to confirm that she can start Herceptin next Wednesday. We can change the date of treatment if she wishes to.

## 2016-08-04 DIAGNOSIS — C50911 Malignant neoplasm of unspecified site of right female breast: Secondary | ICD-10-CM | POA: Diagnosis not present

## 2016-08-08 ENCOUNTER — Encounter: Payer: Self-pay | Admitting: Hematology and Oncology

## 2016-08-08 ENCOUNTER — Ambulatory Visit (HOSPITAL_BASED_OUTPATIENT_CLINIC_OR_DEPARTMENT_OTHER): Payer: Commercial Managed Care - PPO

## 2016-08-08 ENCOUNTER — Other Ambulatory Visit (HOSPITAL_BASED_OUTPATIENT_CLINIC_OR_DEPARTMENT_OTHER): Payer: Commercial Managed Care - PPO

## 2016-08-08 ENCOUNTER — Ambulatory Visit (HOSPITAL_BASED_OUTPATIENT_CLINIC_OR_DEPARTMENT_OTHER): Payer: Commercial Managed Care - PPO | Admitting: Hematology and Oncology

## 2016-08-08 DIAGNOSIS — Z5112 Encounter for antineoplastic immunotherapy: Secondary | ICD-10-CM | POA: Diagnosis not present

## 2016-08-08 DIAGNOSIS — C50211 Malignant neoplasm of upper-inner quadrant of right female breast: Secondary | ICD-10-CM

## 2016-08-08 DIAGNOSIS — Z170 Estrogen receptor positive status [ER+]: Secondary | ICD-10-CM

## 2016-08-08 LAB — COMPREHENSIVE METABOLIC PANEL
ALBUMIN: 3.5 g/dL (ref 3.5–5.0)
ALK PHOS: 80 U/L (ref 40–150)
ALT: 20 U/L (ref 0–55)
AST: 18 U/L (ref 5–34)
Anion Gap: 9 mEq/L (ref 3–11)
BILIRUBIN TOTAL: 0.42 mg/dL (ref 0.20–1.20)
BUN: 13.7 mg/dL (ref 7.0–26.0)
CO2: 22 meq/L (ref 22–29)
Calcium: 9.5 mg/dL (ref 8.4–10.4)
Chloride: 111 mEq/L — ABNORMAL HIGH (ref 98–109)
Creatinine: 0.9 mg/dL (ref 0.6–1.1)
EGFR: 79 mL/min/{1.73_m2} — ABNORMAL LOW (ref >90)
GLUCOSE: 144 mg/dL — AB (ref 70–140)
POTASSIUM: 3.9 meq/L (ref 3.5–5.1)
SODIUM: 141 meq/L (ref 136–145)
TOTAL PROTEIN: 7.2 g/dL (ref 6.4–8.3)

## 2016-08-08 LAB — CBC WITH DIFFERENTIAL/PLATELET
BASO%: 0.8 % (ref 0.0–2.0)
Basophils Absolute: 0.1 10*3/uL (ref 0.0–0.1)
EOS%: 5.9 % (ref 0.0–7.0)
Eosinophils Absolute: 0.5 10*3/uL (ref 0.0–0.5)
HCT: 36.5 % (ref 34.8–46.6)
HEMOGLOBIN: 12.3 g/dL (ref 11.6–15.9)
LYMPH%: 29.6 % (ref 14.0–49.7)
MCH: 29.6 pg (ref 25.1–34.0)
MCHC: 33.7 g/dL (ref 31.5–36.0)
MCV: 88 fL (ref 79.5–101.0)
MONO#: 0.8 10*3/uL (ref 0.1–0.9)
MONO%: 9.3 % (ref 0.0–14.0)
NEUT%: 54.4 % (ref 38.4–76.8)
NEUTROS ABS: 4.6 10*3/uL (ref 1.5–6.5)
Platelets: 225 10*3/uL (ref 145–400)
RBC: 4.15 10*6/uL (ref 3.70–5.45)
RDW: 13.6 % (ref 11.2–14.5)
WBC: 8.5 10*3/uL (ref 3.9–10.3)
lymph#: 2.5 10*3/uL (ref 0.9–3.3)

## 2016-08-08 MED ORDER — SODIUM CHLORIDE 0.9% FLUSH
10.0000 mL | INTRAVENOUS | Status: DC | PRN
  Administered 2016-08-08: 10 mL
  Filled 2016-08-08: qty 10

## 2016-08-08 MED ORDER — ACETAMINOPHEN 325 MG PO TABS
ORAL_TABLET | ORAL | Status: AC
  Filled 2016-08-08: qty 2

## 2016-08-08 MED ORDER — ACETAMINOPHEN 325 MG PO TABS
650.0000 mg | ORAL_TABLET | Freq: Once | ORAL | Status: AC
  Administered 2016-08-08: 650 mg via ORAL

## 2016-08-08 MED ORDER — SODIUM CHLORIDE 0.9 % IV SOLN
Freq: Once | INTRAVENOUS | Status: AC
  Administered 2016-08-08: 16:00:00 via INTRAVENOUS

## 2016-08-08 MED ORDER — HEPARIN SOD (PORK) LOCK FLUSH 100 UNIT/ML IV SOLN
500.0000 [IU] | Freq: Once | INTRAVENOUS | Status: AC | PRN
  Administered 2016-08-08: 500 [IU]
  Filled 2016-08-08: qty 5

## 2016-08-08 MED ORDER — TRASTUZUMAB CHEMO 150 MG IV SOLR
8.0000 mg/kg | Freq: Once | INTRAVENOUS | Status: AC
  Administered 2016-08-08: 882 mg via INTRAVENOUS
  Filled 2016-08-08: qty 42

## 2016-08-08 MED ORDER — DIPHENHYDRAMINE HCL 25 MG PO CAPS
ORAL_CAPSULE | ORAL | Status: AC
  Filled 2016-08-08: qty 1

## 2016-08-08 MED ORDER — DIPHENHYDRAMINE HCL 25 MG PO CAPS
25.0000 mg | ORAL_CAPSULE | Freq: Once | ORAL | Status: AC
  Administered 2016-08-08: 25 mg via ORAL

## 2016-08-08 NOTE — Patient Instructions (Signed)
Trastuzumab injection for infusion What is this medicine? TRASTUZUMAB (tras TOO zoo mab) is a monoclonal antibody. It is used to treat breast cancer and stomach cancer. This medicine may be used for other purposes; ask your health care provider or pharmacist if you have questions. What should I tell my health care provider before I take this medicine? They need to know if you have any of these conditions: -heart disease -heart failure -infection (especially a virus infection such as chickenpox, cold sores, or herpes) -lung or breathing disease, like asthma -recent or ongoing radiation therapy -an unusual or allergic reaction to trastuzumab, benzyl alcohol, or other medications, foods, dyes, or preservatives -pregnant or trying to get pregnant -breast-feeding How should I use this medicine? This drug is given as an infusion into a vein. It is administered in a hospital or clinic by a specially trained health care professional. Talk to your pediatrician regarding the use of this medicine in children. This medicine is not approved for use in children. Overdosage: If you think you have taken too much of this medicine contact a poison control center or emergency room at once. NOTE: This medicine is only for you. Do not share this medicine with others. What if I miss a dose? It is important not to miss a dose. Call your doctor or health care professional if you are unable to keep an appointment. What may interact with this medicine? -doxorubicin -warfarin This list may not describe all possible interactions. Give your health care provider a list of all the medicines, herbs, non-prescription drugs, or dietary supplements you use. Also tell them if you smoke, drink alcohol, or use illegal drugs. Some items may interact with your medicine. What should I watch for while using this medicine? Visit your doctor for checks on your progress. Report any side effects. Continue your course of treatment even  though you feel ill unless your doctor tells you to stop. Call your doctor or health care professional for advice if you get a fever, chills or sore throat, or other symptoms of a cold or flu. Do not treat yourself. Try to avoid being around people who are sick. You may experience fever, chills and shaking during your first infusion. These effects are usually mild and can be treated with other medicines. Report any side effects during the infusion to your health care professional. Fever and chills usually do not happen with later infusions. Do not become pregnant while taking this medicine or for 7 months after stopping it. Women should inform their doctor if they wish to become pregnant or think they might be pregnant. Women of child-bearing potential will need to have a negative pregnancy test before starting this medicine. There is a potential for serious side effects to an unborn child. Talk to your health care professional or pharmacist for more information. Do not breast-feed an infant while taking this medicine or for 7 months after stopping it. Women must use effective birth control with this medicine. What side effects may I notice from receiving this medicine? Side effects that you should report to your doctor or other health care professional as soon as possible: -breathing difficulties -chest pain or palpitations -cough -dizziness or fainting -fever or chills, sore throat -skin rash, itching or hives -swelling of the legs or ankles -unusually weak or tired Side effects that usually do not require medical attention (report to your doctor or other health care professional if they continue or are bothersome): -loss of appetite -headache -muscle aches -nausea This   list may not describe all possible side effects. Call your doctor for medical advice about side effects. You may report side effects to FDA at 1-800-FDA-1088. Where should I keep my medicine? This drug is given in a hospital  or clinic and will not be stored at home. NOTE: This sheet is a summary. It may not cover all possible information. If you have questions about this medicine, talk to your doctor, pharmacist, or health care provider.    2016, Elsevier/Gold Standard. (2015-02-06 11:49:32)  [facility] Discharge Instructions for Patients Receiving Chemotherapy  Today you received the following chemotherapy agents Herceptin.  To help prevent nausea and vomiting after your treatment, we encourage you to take your nausea medication as directed.   If you develop nausea and vomiting that is not controlled by your nausea medication, call the clinic.   BELOW ARE SYMPTOMS THAT SHOULD BE REPORTED IMMEDIATELY:  *FEVER GREATER THAN 100.5 F  *CHILLS WITH OR WITHOUT FEVER  NAUSEA AND VOMITING THAT IS NOT CONTROLLED WITH YOUR NAUSEA MEDICATION  *UNUSUAL SHORTNESS OF BREATH  *UNUSUAL BRUISING OR BLEEDING  TENDERNESS IN MOUTH AND THROAT WITH OR WITHOUT PRESENCE OF ULCERS  *URINARY PROBLEMS  *BOWEL PROBLEMS  UNUSUAL RASH Items with * indicate a potential emergency and should be followed up as soon as possible.  Feel free to call the clinic you have any questions or concerns. The clinic phone number is [phone].  Please show the [place] at check-in to the Emergency Department and triage nurse.

## 2016-08-08 NOTE — Progress Notes (Signed)
Patient Care Team: [name], MD as PCP - General (Obstetrics and Gynecology)  SUMMARY OF ONCOLOGIC HISTORY:   Breast cancer of upper-inner quadrant of right female breast ([place])   01/21/2016 Initial Diagnosis    Right breast biopsy: Invasive lobular cancer, ER 95%, PR 95%, Ki-67 5%, HER-2 positive ratio 2.52      02/01/2016 Breast MRI    Right breast: Post biopsy hematoma measuring 1.9 x 1.3 x 2.5 cm surrounding ring of reactive enhancement, no lymphadenopathy. T2 N0 stage II a clinical stage      03/01/2016 Surgery    Right lumpectomy: ILC, grade 2, 0.8 cm, ALH, additional superior margin : ILC grade to 0.3 cm , positive superior margin , 0/1 LN neg, mammoplasty bil : benign, ER 95%, PR 95%, HER-2 pos, Ki-67 5%, T1bN0 stage IA      03/15/2016 Surgery    Right breast. Margin excision: Benign      04/20/2016 -  Chemotherapy    Taxol Herceptin weekly 12 followed by Herceptin maintenance for 1 year       CHIEF COMPLIANT: Herceptin maintenance  INTERVAL HISTORY: SEVIN FARONE is a 50 year old with above-mentioned history of right breast cancer treated with lumpectomy and is currently on adjuvant therapy. Herceptin could not be given because of decrease in cardiac ejection fraction. She is here back to resume taking Herceptin. Her cardiac ejection fraction has improved. She reports that she was started on Coreg initially and recently she was started on losartan and she does not know why she needs to take too medications for this. She is likely to start radiation this week.  REVIEW OF SYSTEMS:   Constitutional: Denies fevers, chills or abnormal weight loss Eyes: Denies blurriness of vision Ears, nose, mouth, throat, and face: Denies mucositis or sore throat Respiratory: Denies cough, dyspnea or wheezes Cardiovascular: Denies palpitation, chest discomfort Gastrointestinal:  Denies nausea, heartburn or change in bowel habits Skin: Denies abnormal skin rashes Lymphatics: Denies  new lymphadenopathy or easy bruising Neurological:Denies numbness, tingling or new weaknesses Behavioral/Psych: Mood is stable, no new changes  Extremities: No lower extremity edema Breast:  denies any pain or lumps or nodules in either breasts All other systems were reviewed with the patient and are negative.  I have reviewed the past medical history, past surgical history, social history and family history with the patient and they are unchanged from previous note.  ALLERGIES:  is allergic to penicillin g.  MEDICATIONS:  Current Outpatient Prescriptions  Medication Sig Dispense Refill  . carvedilol (COREG) 3.125 MG tablet Take 1 tablet (3.125 mg total) by mouth 2 (two) times daily. 180 tablet 3  . cholecalciferol (VITAMIN D) 1000 units tablet Take 1,000 Units by mouth daily.    Marland Kitchen losartan (COZAAR) 25 MG tablet Take 1 tablet (25 mg total) by mouth daily. 30 tablet 6  . SYNTHROID 112 MCG tablet   0   No current facility-administered medications for this visit.     PHYSICAL EXAMINATION: ECOG PERFORMANCE STATUS: 1 - Symptomatic but completely ambulatory  Vitals:   08/08/16 1444  BP: (!) 137/92  Pulse: (!) 106  Resp: 18  Temp: 98.7 F (37.1 C)   Filed Weights   08/08/16 1444  Weight: 236 lb 14.4 oz (107.5 kg)    GENERAL:alert, no distress and comfortable SKIN: skin color, texture, turgor are normal, no rashes or significant lesions EYES: normal, Conjunctiva are pink and non-injected, sclera clear OROPHARYNX:no exudate, no erythema and lips, buccal mucosa, and tongue normal  NECK: supple, thyroid normal size, non-tender, without nodularity LYMPH:  no palpable lymphadenopathy in the cervical, axillary or inguinal LUNGS: clear to auscultation and percussion with normal breathing effort HEART: regular rate & rhythm and no murmurs and no lower extremity edema ABDOMEN:abdomen soft, non-tender and normal bowel sounds MUSCULOSKELETAL:no cyanosis of digits and no clubbing  NEURO:  alert & oriented x 3 with fluent speech, no focal motor/sensory deficits EXTREMITIES: No lower extremity edema  LABORATORY DATA:  I have reviewed the data as listed   Chemistry      Component Value Date/Time   NA 141 08/08/2016 1428   K 3.9 08/08/2016 1428   CL 111 04/17/2007 0925   CO2 22 08/08/2016 1428   BUN 13.7 08/08/2016 1428   CREATININE 0.9 08/08/2016 1428      Component Value Date/Time   CALCIUM 9.5 08/08/2016 1428   ALKPHOS 80 08/08/2016 1428   AST 18 08/08/2016 1428   ALT 20 08/08/2016 1428   BILITOT 0.42 08/08/2016 1428       Lab Results  Component Value Date   WBC 8.5 08/08/2016   HGB 12.3 08/08/2016   HCT 36.5 08/08/2016   MCV 88.0 08/08/2016   PLT 225 08/08/2016   NEUTROABS 4.6 08/08/2016     ASSESSMENT & PLAN:  Breast cancer of upper-inner quadrant of right female breast ([place]) Right lumpectomy 03/01/2016: ILC, grade 2, 0.8 cm, ALH, additional superior margin : ILC grade to 0.3 cm , positive superior margin , 0/1 LN neg, mammoplasty bil : benign, ER 95%, PR 95%, HER-2 positive on primary tumor and negative on secondary mass which had a ratio of 1.55, gene copy number is 2.25, Ki-67 5%, T1bN0 stage IA  HER-2: HER-2 was positive in the original tumor and the HER-2 on the second excision was negative. I was able to get this clarification from pathology.  Reexcision of superior margin: 5 2017: Benign  Treatment plan: 1. Adjuvant therapy with Taxol and Herceptin weekly 12  completed 07/06/2016 2. Followed by Herceptin maintenance every 3 weeks 1 year (was on hold 06/29/2016 to 08/08/2016 due to a decrease in ejection fraction) 2. Followed adjuvant radiation followed by 3. Antiestrogen therapy ----------------------------------------------------------------------------------------------------------------------------- Current treatment: Adjuvant Herceptin every 3 weeks until the end of June 2017 (being resumed today after holding it for low  EF) Echocardiogram 08/02/2016: EF 55-60 %  Adjuvant radiation therapy is also starting This week Monitoring closely for toxicities. Return to clinic every 3 weeks for Herceptin every 6 weeks for follow-up with [place].   No orders of the defined types were placed in this encounter.  The patient has a good understanding of the overall plan. she agrees with it. she will call with any problems that may develop before the next visit here.   Rulon Eisenmenger, MD 08/08/16

## 2016-08-08 NOTE — Assessment & Plan Note (Signed)
Right lumpectomy 03/01/2016: ILC, grade 2, 0.8 cm, ALH, additional superior margin : ILC grade to 0.3 cm , positive superior margin , 0/1 LN neg, mammoplasty bil : benign, ER 95%, PR 95%, HER-2 positive on primary tumor and negative on secondary mass which had a ratio of 1.55, gene copy number is 2.25, Ki-67 5%, T1bN0 stage IA  HER-2: HER-2 was positive in the original tumor and the HER-2 on the second excision was negative. I was able to get this clarification from pathology.  Reexcision of superior margin: 5 2017: Benign  Treatment plan: 1. Adjuvant therapy with Taxol and Herceptin weekly 12 completed 07/06/2016 2. Followed by Herceptin maintenance every 3 weeks 1 year (was on hold 06/29/2016 to 08/08/2016 due to a decrease in ejection fraction) 2. Followed adjuvant radiation followed by 3. Antiestrogen therapy ----------------------------------------------------------------------------------------------------------------------------- Current treatment: Adjuvant Herceptin every 3 weeks until the end of June 2017 (being resumed today after holding it for low EF) Echocardiogram 08/02/2016: EF 55-60 %  Adjuvant radiation therapy is also starting today.  Return to clinic every 3 weeks for Herceptin every 6 weeks for follow-up with [place].

## 2016-08-09 ENCOUNTER — Ambulatory Visit
Admission: RE | Admit: 2016-08-09 | Discharge: 2016-08-09 | Disposition: A | Discharge status: Home / Self Care | Payer: Commercial Managed Care - PPO | Source: Ambulatory Visit | Attending: Radiation Oncology | Admitting: Radiation Oncology

## 2016-08-09 DIAGNOSIS — C50211 Malignant neoplasm of upper-inner quadrant of right female breast: Secondary | ICD-10-CM

## 2016-08-09 DIAGNOSIS — C50911 Malignant neoplasm of unspecified site of right female breast: Secondary | ICD-10-CM | POA: Diagnosis not present

## 2016-08-09 NOTE — Progress Notes (Signed)
  Radiation Oncology         [phone]) [phone] ________________________________  Name: Janet King MRN: [id number]  Date: 08/09/2016  DOB: Apr 05, 1966  Simulation Verification Note    ICD-9-CM ICD-10-CM   1. Breast cancer of upper-inner quadrant of right female breast ([place]) 174.2 C50.211     Status: outpatient  NARRATIVE: The patient was brought to the treatment unit and placed in the planned treatment position. The clinical setup was verified. Then port films were obtained and uploaded to the radiation oncology medical record software.  The treatment beams were carefully compared against the planned radiation fields. The position location and shape of the radiation fields was reviewed. They targeted volume of tissue appears to be appropriately covered by the radiation beams. Organs at risk appear to be excluded as planned.  Based on my personal review, I approved the simulation verification. The patient's treatment will proceed as planned.  -----------------------------------  Blair Promise, PhD, MD

## 2016-08-10 ENCOUNTER — Ambulatory Visit
Admission: RE | Admit: 2016-08-10 | Discharge: 2016-08-10 | Disposition: A | Discharge status: Home / Self Care | Payer: Commercial Managed Care - PPO | Source: Ambulatory Visit | Attending: Radiation Oncology | Admitting: Radiation Oncology

## 2016-08-10 DIAGNOSIS — C50911 Malignant neoplasm of unspecified site of right female breast: Secondary | ICD-10-CM | POA: Diagnosis not present

## 2016-08-11 ENCOUNTER — Ambulatory Visit
Admission: RE | Admit: 2016-08-11 | Discharge: 2016-08-11 | Disposition: A | Discharge status: Home / Self Care | Payer: Commercial Managed Care - PPO | Source: Ambulatory Visit | Attending: Radiation Oncology | Admitting: Radiation Oncology

## 2016-08-11 DIAGNOSIS — C50911 Malignant neoplasm of unspecified site of right female breast: Secondary | ICD-10-CM | POA: Diagnosis not present

## 2016-08-12 ENCOUNTER — Ambulatory Visit
Admission: RE | Admit: 2016-08-12 | Discharge: 2016-08-12 | Disposition: A | Discharge status: Home / Self Care | Payer: Commercial Managed Care - PPO | Source: Ambulatory Visit | Attending: Radiation Oncology | Admitting: Radiation Oncology

## 2016-08-12 DIAGNOSIS — C50911 Malignant neoplasm of unspecified site of right female breast: Secondary | ICD-10-CM | POA: Diagnosis not present

## 2016-08-15 ENCOUNTER — Ambulatory Visit
Admission: RE | Admit: 2016-08-15 | Discharge: 2016-08-15 | Disposition: A | Discharge status: Home / Self Care | Payer: Commercial Managed Care - PPO | Source: Ambulatory Visit | Attending: Radiation Oncology | Admitting: Radiation Oncology

## 2016-08-15 DIAGNOSIS — C50211 Malignant neoplasm of upper-inner quadrant of right female breast: Secondary | ICD-10-CM

## 2016-08-15 DIAGNOSIS — C50911 Malignant neoplasm of unspecified site of right female breast: Secondary | ICD-10-CM | POA: Diagnosis not present

## 2016-08-15 DIAGNOSIS — Z170 Estrogen receptor positive status [ER+]: Secondary | ICD-10-CM

## 2016-08-15 MED ORDER — ALRA NON-METALLIC DEODORANT (RAD-ONC)
1.0000 | Freq: Once | TOPICAL | Status: AC
Start: 2016-08-15 — End: 2016-08-15
  Administered 2016-08-15: 1 via TOPICAL

## 2016-08-15 MED ORDER — RADIAPLEXRX EX GEL
Freq: Once | CUTANEOUS | Status: AC
Start: 2016-08-15 — End: 2016-08-15
  Administered 2016-08-15: 17:00:00 via TOPICAL

## 2016-08-15 NOTE — Progress Notes (Signed)
Pt here for patient teaching.  Pt given Radiation and You booklet, Managing Acute Radiation Side Effects for Head and Neck Cancer handout, skin care instructions, Alra deodorant and Radiaplex gel. Pt reports they have not watched the Radiation Therapy Education video gave the link will watch on August 15, 2016.  Reviewed areas of pertinence such as fatigue, hair loss, tenderness and swelling . Pt able to give teach back of to pat skin, use unscented/gentle soap, use baby wipes and drink plenty of water,apply Radiaplex bid, avoid applying anything to skin within 4 hours of treatment, avoid wearing an under wire bra and to use an electric razor if they must shave. Pt demonstrated understanding of information given and will contact nursing with any questions or concerns.     [email or web address]

## 2016-08-16 ENCOUNTER — Ambulatory Visit
Admission: RE | Admit: 2016-08-16 | Discharge: 2016-08-16 | Disposition: A | Discharge status: Home / Self Care | Payer: Commercial Managed Care - PPO | Source: Ambulatory Visit | Attending: Radiation Oncology | Admitting: Radiation Oncology

## 2016-08-16 ENCOUNTER — Encounter: Payer: Self-pay | Admitting: Radiation Oncology

## 2016-08-16 VITALS — BP 115/88 | HR 109 | Temp 98.3°F | Ht 66.0 in | Wt 236.2 lb

## 2016-08-16 DIAGNOSIS — C50911 Malignant neoplasm of unspecified site of right female breast: Secondary | ICD-10-CM | POA: Diagnosis not present

## 2016-08-16 DIAGNOSIS — C50211 Malignant neoplasm of upper-inner quadrant of right female breast: Secondary | ICD-10-CM

## 2016-08-16 NOTE — Progress Notes (Signed)
  Radiation Oncology         [phone]) [phone] ________________________________  Name: Janet King MRN: [id number]  Date: 08/16/2016  DOB: April 20, 1966  Weekly Radiation Therapy Management    ICD-9-CM ICD-10-CM   1. Malignant neoplasm of upper-inner quadrant of right female breast, unspecified estrogen receptor status ([facility]) 174.2 C50.211         Stage IA (pT1b, pN0) invasive lobular carcinoma of the right breast (triple positive)    Current Dose: 9 Gy     Planned Dose:  50.4 Gy  Narrative . . . . . . . . The patient presents for routine under treatment assessment. Sharyn Lull has completed 5 fractions to her right breast.  She denies having pain or an increase in fatigue.  She is using radiaplex twice a day.  The skin on her right breast is red.                                    The patient is without complaint.                                 Set-up films were reviewed.                                 The chart was checked. Physical Findings. . .  height is 5\' 6"  (1.676 m) and weight is 236 lb 3.2 oz (107.1 kg). Her oral temperature is 98.3 F (36.8 C). Her blood pressure is 115/88 and her pulse is 109 (abnormal). Her oxygen saturation is 100%. . Weight essentially stable.  No significant changes. Lungs are clear to auscultation bilaterally. Heart has regular rate and rhythm. No palpable cervical, supraclavicular, or axillary adenopathy. Abdomen soft, non-tender, normal bowel sounds. Mild erythema in the right breast.  Impression . . . . . . . The patient is tolerating radiation. Plan . . . . . . . . . . . . Continue treatment as planned.  ________________________________   Blair Promise, PhD, MD This document serves as a record of services personally performed by Gery Pray, MD. It was created on his behalf by Truddie Hidden, a trained medical scribe. The creation of this record is based on the scribe's personal observations and the provider's statements to them. This document  has been checked and approved by the attending provider.

## 2016-08-16 NOTE — Progress Notes (Signed)
Janet King has completed 5 fractions to her right breast.  She denies having pain or an increase in fatigue.  She is using radiaplex twice a day.  The skin on her right breast is red.  BP 115/88 (BP Location: Left Wrist, Patient Position: Sitting)   Pulse (!) 109   Temp 98.3 F (36.8 C) (Oral)   Ht 5\' 6"  (1.676 m)   Wt 236 lb 3.2 oz (107.1 kg)   SpO2 100%   BMI 38.12 kg/m    Wt Readings from Last 3 Encounters:  08/16/16 236 lb 3.2 oz (107.1 kg)  08/08/16 236 lb 14.4 oz (107.5 kg)  08/02/16 238 lb 12 oz (108.3 kg)

## 2016-08-17 ENCOUNTER — Ambulatory Visit
Admission: RE | Admit: 2016-08-17 | Discharge: 2016-08-17 | Disposition: A | Discharge status: Home / Self Care | Payer: Commercial Managed Care - PPO | Source: Ambulatory Visit | Attending: Radiation Oncology | Admitting: Radiation Oncology

## 2016-08-17 DIAGNOSIS — C50911 Malignant neoplasm of unspecified site of right female breast: Secondary | ICD-10-CM | POA: Diagnosis not present

## 2016-08-18 ENCOUNTER — Ambulatory Visit
Admission: RE | Admit: 2016-08-18 | Discharge: 2016-08-18 | Disposition: A | Discharge status: Home / Self Care | Payer: Commercial Managed Care - PPO | Source: Ambulatory Visit | Attending: Radiation Oncology | Admitting: Radiation Oncology

## 2016-08-18 DIAGNOSIS — C50911 Malignant neoplasm of unspecified site of right female breast: Secondary | ICD-10-CM | POA: Diagnosis not present

## 2016-08-19 ENCOUNTER — Ambulatory Visit
Admission: RE | Admit: 2016-08-19 | Discharge: 2016-08-19 | Disposition: A | Discharge status: Home / Self Care | Payer: Commercial Managed Care - PPO | Source: Ambulatory Visit | Attending: Radiation Oncology | Admitting: Radiation Oncology

## 2016-08-19 DIAGNOSIS — C50911 Malignant neoplasm of unspecified site of right female breast: Secondary | ICD-10-CM | POA: Diagnosis not present

## 2016-08-22 ENCOUNTER — Ambulatory Visit
Admission: RE | Admit: 2016-08-22 | Discharge: 2016-08-22 | Disposition: A | Discharge status: Home / Self Care | Payer: Commercial Managed Care - PPO | Source: Ambulatory Visit | Attending: Radiation Oncology | Admitting: Radiation Oncology

## 2016-08-22 DIAGNOSIS — C50911 Malignant neoplasm of unspecified site of right female breast: Secondary | ICD-10-CM | POA: Diagnosis not present

## 2016-08-23 ENCOUNTER — Encounter: Payer: Self-pay | Admitting: Radiation Oncology

## 2016-08-23 ENCOUNTER — Ambulatory Visit
Admission: RE | Admit: 2016-08-23 | Discharge: 2016-08-23 | Disposition: A | Discharge status: Home / Self Care | Payer: Commercial Managed Care - PPO | Source: Ambulatory Visit | Attending: Radiation Oncology | Admitting: Radiation Oncology

## 2016-08-23 VITALS — BP 135/89 | HR 94 | Temp 98.3°F | Ht 66.0 in | Wt 235.6 lb

## 2016-08-23 DIAGNOSIS — C50211 Malignant neoplasm of upper-inner quadrant of right female breast: Secondary | ICD-10-CM

## 2016-08-23 DIAGNOSIS — C50911 Malignant neoplasm of unspecified site of right female breast: Secondary | ICD-10-CM | POA: Diagnosis not present

## 2016-08-23 NOTE — Progress Notes (Signed)
Janet King has completed 10 fractions to her right breast.  She denies having pain.  She continues to report having fatigue.  She is using radiaplex 2-3 times a day.  The skin on her right breast is red.    BP 135/89 (BP Location: Left Wrist, Patient Position: Sitting)   Pulse 94   Temp 98.3 F (36.8 C) (Oral)   Ht 5\' 6"  (1.676 m)   Wt 235 lb 9.6 oz (106.9 kg)   SpO2 100%   BMI 38.03 kg/m    Wt Readings from Last 3 Encounters:  08/23/16 235 lb 9.6 oz (106.9 kg)  08/16/16 236 lb 3.2 oz (107.1 kg)  08/08/16 236 lb 14.4 oz (107.5 kg)

## 2016-08-23 NOTE — Progress Notes (Signed)
  Radiation Oncology         [phone]) [phone] ________________________________  Name: Janet King MRN: [id number]  Date: 08/23/2016  DOB: 05-02-1966  Weekly Radiation Therapy Management    ICD-9-CM ICD-10-CM   1. Malignant neoplasm of upper-inner quadrant of right female breast, unspecified estrogen receptor status (H[facility]) 174.2 C50.211         Stage IA (pT1b, pN0) invasive lobular carcinoma of the right breast (triple positive)    Current Dose: 18 Gy     Planned Dose:  50.4 Gy  Narrative . . . . . . . . The patient presents for routine under treatment assessment.has completed 10 fractions to her right breast.  She denies having pain.  She continues to report having fatigue.  She is using radiaplex 2-3 times a day.  The skin on her right breast is red. The patient reports that she is feeling fatigued but does not think this is due to the radiation                         The patient is without complaint.                              Set-up films were reviewed.                                 The chart was checked. Physical Findings. . .  height is 5\' 6"  (1.676 m) and weight is 235 lb 9.6 oz (106.9 kg). Her oral temperature is 98.3 F (36.8 C). Her blood pressure is 135/89 and her pulse is 94. Her oxygen saturation is 100%. . Weight essentially stable.  No significant changes. Lungs are clear to auscultation bilaterally. Heart has regular rate and rhythm. No palpable cervical, supraclavicular, or axillary adenopathy. Abdomen soft, non-tender, normal bowel sounds. Mild erythema in the right breast. Some crusting along one of the patient's surgical scars from her reduction mammoplasty. Impression . . . . . . . The patient is tolerating radiation. Plan . . . . . . . . . . . . Continue treatment as planned. She will apply a triple antibiotic ointment to the small area of crusting along the surgical bed.  ________________________________   Blair Promise, PhD, MD This document serves as  a record of services personally performed by Gery Pray, MD. It was created on his behalf by Truddie Hidden, a trained medical scribe. The creation of this record is based on the scribe's personal observations and the provider's statements to them. This document has been checked and approved by the attending provider.

## 2016-08-24 ENCOUNTER — Ambulatory Visit
Admission: RE | Admit: 2016-08-24 | Discharge: 2016-08-24 | Disposition: A | Discharge status: Home / Self Care | Payer: Commercial Managed Care - PPO | Source: Ambulatory Visit | Attending: Radiation Oncology | Admitting: Radiation Oncology

## 2016-08-24 DIAGNOSIS — C50911 Malignant neoplasm of unspecified site of right female breast: Secondary | ICD-10-CM | POA: Diagnosis not present

## 2016-08-25 ENCOUNTER — Ambulatory Visit
Admission: RE | Admit: 2016-08-25 | Discharge: 2016-08-25 | Disposition: A | Discharge status: Home / Self Care | Payer: Commercial Managed Care - PPO | Source: Ambulatory Visit | Attending: Radiation Oncology | Admitting: Radiation Oncology

## 2016-08-25 DIAGNOSIS — C50911 Malignant neoplasm of unspecified site of right female breast: Secondary | ICD-10-CM | POA: Diagnosis not present

## 2016-08-26 ENCOUNTER — Ambulatory Visit
Admission: RE | Admit: 2016-08-26 | Discharge: 2016-08-26 | Disposition: A | Discharge status: Home / Self Care | Payer: Commercial Managed Care - PPO | Source: Ambulatory Visit | Attending: Radiation Oncology | Admitting: Radiation Oncology

## 2016-08-26 DIAGNOSIS — C50911 Malignant neoplasm of unspecified site of right female breast: Secondary | ICD-10-CM | POA: Diagnosis not present

## 2016-08-29 ENCOUNTER — Ambulatory Visit (HOSPITAL_BASED_OUTPATIENT_CLINIC_OR_DEPARTMENT_OTHER): Payer: Commercial Managed Care - PPO

## 2016-08-29 ENCOUNTER — Ambulatory Visit
Admission: RE | Admit: 2016-08-29 | Discharge: 2016-08-29 | Disposition: A | Discharge status: Home / Self Care | Payer: Commercial Managed Care - PPO | Source: Ambulatory Visit | Attending: Radiation Oncology | Admitting: Radiation Oncology

## 2016-08-29 VITALS — BP 118/69 | HR 85 | Temp 97.0°F

## 2016-08-29 DIAGNOSIS — Z5112 Encounter for antineoplastic immunotherapy: Secondary | ICD-10-CM | POA: Diagnosis not present

## 2016-08-29 DIAGNOSIS — C50211 Malignant neoplasm of upper-inner quadrant of right female breast: Secondary | ICD-10-CM

## 2016-08-29 DIAGNOSIS — C50911 Malignant neoplasm of unspecified site of right female breast: Secondary | ICD-10-CM | POA: Diagnosis not present

## 2016-08-29 MED ORDER — DIPHENHYDRAMINE HCL 25 MG PO CAPS
25.0000 mg | ORAL_CAPSULE | Freq: Once | ORAL | Status: AC
  Administered 2016-08-29: 25 mg via ORAL

## 2016-08-29 MED ORDER — ACETAMINOPHEN 325 MG PO TABS
650.0000 mg | ORAL_TABLET | Freq: Once | ORAL | Status: AC
  Administered 2016-08-29: 650 mg via ORAL

## 2016-08-29 MED ORDER — SODIUM CHLORIDE 0.9 % IV SOLN
Freq: Once | INTRAVENOUS | Status: AC
  Administered 2016-08-29: 15:00:00 via INTRAVENOUS

## 2016-08-29 MED ORDER — DIPHENHYDRAMINE HCL 25 MG PO CAPS
ORAL_CAPSULE | ORAL | Status: AC
  Filled 2016-08-29: qty 1

## 2016-08-29 MED ORDER — HEPARIN SOD (PORK) LOCK FLUSH 100 UNIT/ML IV SOLN
500.0000 [IU] | Freq: Once | INTRAVENOUS | Status: AC | PRN
  Administered 2016-08-29: 500 [IU]
  Filled 2016-08-29: qty 5

## 2016-08-29 MED ORDER — TRASTUZUMAB CHEMO 150 MG IV SOLR
6.0000 mg/kg | Freq: Once | INTRAVENOUS | Status: AC
  Administered 2016-08-29: 651 mg via INTRAVENOUS
  Filled 2016-08-29: qty 9.59

## 2016-08-29 MED ORDER — ACETAMINOPHEN 325 MG PO TABS
ORAL_TABLET | ORAL | Status: AC
  Filled 2016-08-29: qty 2

## 2016-08-29 MED ORDER — SODIUM CHLORIDE 0.9% FLUSH
10.0000 mL | INTRAVENOUS | Status: DC | PRN
Start: 2016-08-29 — End: 2016-08-29
  Administered 2016-08-29: 10 mL
  Filled 2016-08-29: qty 10

## 2016-08-29 NOTE — Patient Instructions (Signed)
[facility] Discharge Instructions for Patients Receiving Chemotherapy  Today you received the following chemotherapy agents: Herceptin   To help prevent nausea and vomiting after your treatment, we encourage you to take your nausea medication as directed.    If you develop nausea and vomiting that is not controlled by your nausea medication, call the clinic.   BELOW ARE SYMPTOMS THAT SHOULD BE REPORTED IMMEDIATELY:  *FEVER GREATER THAN 100.5 F  *CHILLS WITH OR WITHOUT FEVER  NAUSEA AND VOMITING THAT IS NOT CONTROLLED WITH YOUR NAUSEA MEDICATION  *UNUSUAL SHORTNESS OF BREATH  *UNUSUAL BRUISING OR BLEEDING  TENDERNESS IN MOUTH AND THROAT WITH OR WITHOUT PRESENCE OF ULCERS  *URINARY PROBLEMS  *BOWEL PROBLEMS  UNUSUAL RASH Items with * indicate a potential emergency and should be followed up as soon as possible.  Feel free to call the clinic you have any questions or concerns. The clinic phone number is [phone].  Please show the CHEMO ALERT CARD at check-in to the Emergency Department and triage nurse.   

## 2016-08-30 ENCOUNTER — Encounter: Payer: Self-pay | Admitting: Radiation Oncology

## 2016-08-30 ENCOUNTER — Ambulatory Visit
Admission: RE | Admit: 2016-08-30 | Discharge: 2016-08-30 | Disposition: A | Discharge status: Home / Self Care | Payer: Commercial Managed Care - PPO | Source: Ambulatory Visit | Attending: Radiation Oncology | Admitting: Radiation Oncology

## 2016-08-30 VITALS — BP 139/99 | HR 92 | Temp 97.7°F | Ht 66.0 in | Wt 236.8 lb

## 2016-08-30 DIAGNOSIS — C50911 Malignant neoplasm of unspecified site of right female breast: Secondary | ICD-10-CM | POA: Diagnosis not present

## 2016-08-30 DIAGNOSIS — C50211 Malignant neoplasm of upper-inner quadrant of right female breast: Secondary | ICD-10-CM

## 2016-08-30 NOTE — Progress Notes (Signed)
  Radiation Oncology         [phone] ________________________________  Name: Janet King MRN: [id number]  Date: 08/30/2016  DOB: [birth year]  Weekly Radiation Therapy Management    ICD-9-CM ICD-10-CM   1. Malignant neoplasm of upper-inner quadrant of right female breast, unspecified estrogen receptor status (HCC) 174.2 C50.211          Stage IA (pT1b, pN0) invasive lobular carcinoma of the right breast (triple positive)    Current Dose: 27.3 Gy     Planned Dose:  50.4 Gy  Narrative . . . [name] has completed 15 fractions to her right breast.  She denies having pain or fatigue.  She is using radiaplex 2-3 times per day.  The skin on her right breast is red. The patient reports that the redness does not bother her and she denies pain.                            The patient is without complaint.                              Set-up films were reviewed.                                 The chart was checked. Physical Findings. . .  height is 5\' 6"  (1.676 m) and weight is 236 lb 12.8 oz (107.4 kg). Her oral temperature is 97.7 F (36.5 C). Her blood pressure is 139/99 (abnormal) and her pulse is 92. Her oxygen saturation is 100%. . Weight essentially stable.  No significant changes. Lungs are clear to auscultation bilaterally. Heart has regular rate and rhythm. No palpable cervical, supraclavicular, or axillary adenopathy. Abdomen soft, non-tender, normal bowel sounds. Diffuse erythema throughout her right breast, some scabs along her surgical scars.  No moist desquamation or drainage.  Impression . . . . . . . The patient is tolerating radiation. Plan . . . . . . . . . . . . Continue treatment as planned.   ________________________________   Blair Promise, PhD, MD This document serves as a record of services personally performed by Gery Pray, MD. It was created on his behalf by Truddie Hidden, a trained medical scribe. The creation of this record is based  on the scribe's personal observations and the provider's statements to them. This document has been checked and approved by the attending provider.

## 2016-08-30 NOTE — Progress Notes (Signed)
Michlelle has completed 15 fractions to her right breast.  She denies having pain or fatigue.  She is using radiaplex 2-3 times per day.  The skin on her right breast is red.  BP (!) 139/99 (BP Location: Left Wrist, Patient Position: Sitting)   Pulse 92   Temp 97.7 F (36.5 C) (Oral)   Ht 5\' 6"  (1.676 m)   Wt 236 lb 12.8 oz (107.4 kg)   SpO2 100%   BMI 38.22 kg/m    Wt Readings from Last 3 Encounters:  08/30/16 236 lb 12.8 oz (107.4 kg)  08/23/16 235 lb 9.6 oz (106.9 kg)  08/16/16 236 lb 3.2 oz (107.1 kg)

## 2016-08-31 ENCOUNTER — Ambulatory Visit
Admission: RE | Admit: 2016-08-31 | Discharge: 2016-08-31 | Disposition: A | Discharge status: Home / Self Care | Payer: Commercial Managed Care - PPO | Source: Ambulatory Visit | Attending: Radiation Oncology | Admitting: Radiation Oncology

## 2016-08-31 DIAGNOSIS — C50911 Malignant neoplasm of unspecified site of right female breast: Secondary | ICD-10-CM | POA: Diagnosis not present

## 2016-09-01 ENCOUNTER — Ambulatory Visit
Admission: RE | Admit: 2016-09-01 | Discharge: 2016-09-01 | Disposition: A | Discharge status: Home / Self Care | Payer: Commercial Managed Care - PPO | Source: Ambulatory Visit | Attending: Radiation Oncology | Admitting: Radiation Oncology

## 2016-09-01 DIAGNOSIS — C50911 Malignant neoplasm of unspecified site of right female breast: Secondary | ICD-10-CM | POA: Diagnosis not present

## 2016-09-02 ENCOUNTER — Ambulatory Visit
Admission: RE | Admit: 2016-09-02 | Discharge: 2016-09-02 | Disposition: A | Discharge status: Home / Self Care | Payer: Commercial Managed Care - PPO | Source: Ambulatory Visit | Attending: Radiation Oncology | Admitting: Radiation Oncology

## 2016-09-02 DIAGNOSIS — C50911 Malignant neoplasm of unspecified site of right female breast: Secondary | ICD-10-CM | POA: Diagnosis not present

## 2016-09-05 ENCOUNTER — Ambulatory Visit
Admission: RE | Admit: 2016-09-05 | Discharge: 2016-09-05 | Disposition: A | Discharge status: Home / Self Care | Payer: Commercial Managed Care - PPO | Source: Ambulatory Visit | Attending: Radiation Oncology | Admitting: Radiation Oncology

## 2016-09-05 DIAGNOSIS — C50911 Malignant neoplasm of unspecified site of right female breast: Secondary | ICD-10-CM | POA: Diagnosis not present

## 2016-09-06 ENCOUNTER — Ambulatory Visit
Admission: RE | Admit: 2016-09-06 | Discharge: 2016-09-06 | Disposition: A | Discharge status: Home / Self Care | Payer: Commercial Managed Care - PPO | Source: Ambulatory Visit | Attending: Radiation Oncology | Admitting: Radiation Oncology

## 2016-09-06 ENCOUNTER — Encounter: Payer: Self-pay | Admitting: Radiation Oncology

## 2016-09-06 VITALS — BP 137/78 | HR 92 | Temp 98.3°F | Resp 18 | Ht 66.0 in | Wt 234.6 lb

## 2016-09-06 DIAGNOSIS — C50211 Malignant neoplasm of upper-inner quadrant of right female breast: Secondary | ICD-10-CM

## 2016-09-06 DIAGNOSIS — C50911 Malignant neoplasm of unspecified site of right female breast: Secondary | ICD-10-CM | POA: Diagnosis not present

## 2016-09-06 NOTE — Progress Notes (Signed)
Janet King has completed 20 fractions to her right breast.  She denies having pain or fatigue.  She is using radiaplex 2-3 times per day.  The skin on her right breast is red. Wt Readings from Last 3 Encounters:  09/06/16 234 lb 9.6 oz (106.4 kg)  08/30/16 236 lb 12.8 oz (107.4 kg)  08/23/16 235 lb 9.6 oz (106.9 kg)  BP 137/78 (BP Location: Left Arm, Patient Position: Sitting, Cuff Size: Normal)   Pulse 92   Temp 98.3 F (36.8 C) (Oral)   Resp 18   Ht 5\' 6"  (1.676 m)   Wt 234 lb 9.6 oz (106.4 kg)   LMP 04/20/2016   SpO2 98%   BMI 37.87 kg/m

## 2016-09-06 NOTE — Progress Notes (Signed)
  Radiation Oncology         [phone] ________________________________  Name: Janet King MRN: [id number]  Date: 09/06/2016  DOB: 12-08-1965  Weekly Radiation Therapy Management    ICD-9-CM ICD-10-CM   1. Malignant neoplasm of upper-inner quadrant of right female breast, unspecified estrogen receptor status (HCC) 174.2 C50.211          Stage IA (pT1b, pN0) invasive lobular carcinoma of the right breast (triple positive)    Current Dose: 36 Gy     Planned Dose:  50.4 Gy  Narrative . . . Janet King has completed 20 fractions to her right breast. She denies having pain or fatigue. She is using radiaplex 2-3 times per day. The skin on her right breast is red.                            The patient is without complaint.                              Set-up films were reviewed.                                 The chart was checked. Physical Findings. . .  height is 5\' 6"  (1.676 m) and weight is 234 lb 9.6 oz (106.4 kg). Her oral temperature is 98.3 F (36.8 C). Her blood pressure is 137/78 and her pulse is 92. Her respiration is 18 and oxygen saturation is 98%. . Weight essentially stable.  . Lungs are clear to auscultation bilaterally. Heart has regular rate and rhythm. No palpable cervical, supraclavicular, or axillary adenopathy. Abdomen soft, non-tender, normal bowel sounds. Diffuse erythema throughout her right breast. No skin breakdown Impression . . . . . . . The patient is tolerating radiation. Plan . . . . . . . . . . . . Continue treatment as planned.   ________________________________   Blair Promise, PhD, MD This document serves as a record of services personally performed by Gery Pray, MD. It was created on his behalf by Truddie Hidden, a trained medical scribe. The creation of this record is based on the scribe's personal observations and the provider's statements to them. This document has been checked and approved by the attending provider.

## 2016-09-07 ENCOUNTER — Ambulatory Visit
Admission: RE | Admit: 2016-09-07 | Discharge: 2016-09-07 | Disposition: A | Discharge status: Home / Self Care | Payer: Commercial Managed Care - PPO | Source: Ambulatory Visit | Attending: Radiation Oncology | Admitting: Radiation Oncology

## 2016-09-07 DIAGNOSIS — C50911 Malignant neoplasm of unspecified site of right female breast: Secondary | ICD-10-CM | POA: Diagnosis not present

## 2016-09-08 ENCOUNTER — Ambulatory Visit
Admission: RE | Admit: 2016-09-08 | Discharge: 2016-09-08 | Disposition: A | Discharge status: Home / Self Care | Payer: Commercial Managed Care - PPO | Source: Ambulatory Visit | Attending: Radiation Oncology | Admitting: Radiation Oncology

## 2016-09-08 DIAGNOSIS — C50911 Malignant neoplasm of unspecified site of right female breast: Secondary | ICD-10-CM | POA: Diagnosis not present

## 2016-09-09 ENCOUNTER — Ambulatory Visit
Admission: RE | Admit: 2016-09-09 | Discharge: 2016-09-09 | Disposition: A | Discharge status: Home / Self Care | Payer: Commercial Managed Care - PPO | Source: Ambulatory Visit | Attending: Radiation Oncology | Admitting: Radiation Oncology

## 2016-09-09 DIAGNOSIS — C50911 Malignant neoplasm of unspecified site of right female breast: Secondary | ICD-10-CM | POA: Diagnosis not present

## 2016-09-12 ENCOUNTER — Ambulatory Visit
Admission: RE | Admit: 2016-09-12 | Discharge: 2016-09-12 | Disposition: A | Discharge status: Home / Self Care | Payer: Commercial Managed Care - PPO | Source: Ambulatory Visit | Attending: Radiation Oncology | Admitting: Radiation Oncology

## 2016-09-12 DIAGNOSIS — C50911 Malignant neoplasm of unspecified site of right female breast: Secondary | ICD-10-CM | POA: Diagnosis not present

## 2016-09-13 ENCOUNTER — Ambulatory Visit
Admission: RE | Admit: 2016-09-13 | Discharge: 2016-09-13 | Disposition: A | Discharge status: Home / Self Care | Payer: Commercial Managed Care - PPO | Source: Ambulatory Visit | Attending: Radiation Oncology | Admitting: Radiation Oncology

## 2016-09-13 ENCOUNTER — Encounter: Payer: Self-pay | Admitting: Radiation Oncology

## 2016-09-13 VITALS — BP 119/77 | HR 97 | Temp 98.4°F | Ht 66.0 in | Wt 232.6 lb

## 2016-09-13 DIAGNOSIS — C50211 Malignant neoplasm of upper-inner quadrant of right female breast: Secondary | ICD-10-CM

## 2016-09-13 DIAGNOSIS — C50911 Malignant neoplasm of unspecified site of right female breast: Secondary | ICD-10-CM | POA: Diagnosis not present

## 2016-09-13 NOTE — Addendum Note (Signed)
Encounter addended by: Gery Pray, MD on: 09/13/2016  5:16 PM<BR>    Actions taken: LOS modified, Follow-up modified

## 2016-09-13 NOTE — Progress Notes (Signed)
Janet King presents for her 25th fraction of radiation to her Right Breast. She denies pain or fatigue. Her Right Breast is red but nontender. She is using the Radiaplex cream twice daily as directed.   BP 119/77   Pulse 97   Temp 98.4 F (36.9 C)   Ht 5\' 6"  (1.676 m)   Wt 232 lb 9.6 oz (105.5 kg)   LMP 04/20/2016   SpO2 99% Comment: room air  BMI 37.54 kg/m    Wt Readings from Last 3 Encounters:  09/13/16 232 lb 9.6 oz (105.5 kg)  09/06/16 234 lb 9.6 oz (106.4 kg)  08/30/16 236 lb 12.8 oz (107.4 kg)

## 2016-09-13 NOTE — Progress Notes (Signed)
  Radiation Oncology         [phone] ________________________________  Name: Janet King MRN: [id number]  Date: 09/13/2016  DOB: 1966-04-01  Weekly Radiation Therapy Management    ICD-9-CM ICD-10-CM   1. Malignant neoplasm of upper-inner quadrant of right female breast, unspecified estrogen receptor status (HCC) 174.2 C50.211          Stage IA (pT1b, pN0) invasive lobular carcinoma of the right breast (triple positive)    Current Dose: 45 Gy     Planned Dose:  50.4 Gy  Narrative . . . [name] has completed 25 fractions to her right breast. She denies having pain or fatigue. She is using radiaplex 2-3 times per day.                          The patient is without complaint.                              Set-up films were reviewed.                                 The chart was checked. Physical Findings. . .  height is 5\' 6"  (1.676 m) and weight is 232 lb 9.6 oz (105.5 kg). Her temperature is 98.4 F (36.9 C). Her blood pressure is 119/77 and her pulse is 97. Her oxygen saturation is 99%. . Weight essentially stable.  . Lungs are clear to auscultation bilaterally. Heart has regular rate and rhythm. No palpable cervical, supraclavicular, or axillary adenopathy. Diffuse erythema throughout her right breast. No skin breakdown. nontender Impression . . . . . . . The patient is tolerating radiation. Plan . . . . . . . . . . . . Continue treatment as planned.   ________________________________   Blair Promise, PhD, MD  This document serves as a record of services personally performed by Gery Pray, MD. It was created on his behalf by Bethann Humble, a trained medical scribe. The creation of this record is based on the scribe's personal observations and the provider's statements to them. This document has been checked and approved by the attending provider.

## 2016-09-14 ENCOUNTER — Ambulatory Visit
Admission: RE | Admit: 2016-09-14 | Discharge: 2016-09-14 | Disposition: A | Discharge status: Home / Self Care | Payer: Commercial Managed Care - PPO | Source: Ambulatory Visit | Attending: Radiation Oncology | Admitting: Radiation Oncology

## 2016-09-14 DIAGNOSIS — C50911 Malignant neoplasm of unspecified site of right female breast: Secondary | ICD-10-CM | POA: Diagnosis not present

## 2016-09-15 ENCOUNTER — Ambulatory Visit
Admission: RE | Admit: 2016-09-15 | Discharge: 2016-09-15 | Disposition: A | Discharge status: Home / Self Care | Payer: Commercial Managed Care - PPO | Source: Ambulatory Visit | Attending: Radiation Oncology | Admitting: Radiation Oncology

## 2016-09-15 DIAGNOSIS — C50911 Malignant neoplasm of unspecified site of right female breast: Secondary | ICD-10-CM | POA: Diagnosis not present

## 2016-09-16 ENCOUNTER — Ambulatory Visit
Admission: RE | Admit: 2016-09-16 | Discharge: 2016-09-16 | Disposition: A | Discharge status: Home / Self Care | Payer: Commercial Managed Care - PPO | Source: Ambulatory Visit | Attending: Radiation Oncology | Admitting: Radiation Oncology

## 2016-09-16 ENCOUNTER — Encounter: Payer: Self-pay | Admitting: Radiation Oncology

## 2016-09-16 VITALS — BP 129/91 | HR 99 | Temp 98.2°F | Ht 66.0 in | Wt 234.0 lb

## 2016-09-16 DIAGNOSIS — Z170 Estrogen receptor positive status [ER+]: Principal | ICD-10-CM

## 2016-09-16 DIAGNOSIS — C50211 Malignant neoplasm of upper-inner quadrant of right female breast: Secondary | ICD-10-CM

## 2016-09-16 DIAGNOSIS — C50911 Malignant neoplasm of unspecified site of right female breast: Secondary | ICD-10-CM | POA: Diagnosis not present

## 2016-09-16 NOTE — Progress Notes (Signed)
   Weekly Management Note  Outpatient    ICD-9-CM ICD-10-CM   1. Malignant neoplasm of upper-inner quadrant of right breast in female, estrogen receptor positive ([place]) 174.2 C50.211    V86.0 Z17.0     Completed Radiotherapy. Total Dose:  50.4Gy in 28 fractions  Narrative:  The patient presents for routine under treatment assessment on last day of radiotherapy.  CBCT/MVCT images/Port film x-rays were reviewed.  The chart was checked. Doing well.   Physical Findings: Skin is erythematous over right breast.  Pustule, dry, above right nipple.  Improved per patient.    BP (!) 129/91 (BP Location: Left Arm, Patient Position: Sitting, Cuff Size: Large)   Pulse 99   Temp 98.2 F (36.8 C) (Oral)   Ht 5\' 6"  (1.676 m)   Wt 234 lb 9.6 oz (106.4 kg)   LMP 04/20/2016   BMI 37.87 kg/m    Wt Readings from Last 3 Encounters:  09/16/16 234 lb 9.6 oz (106.4 kg)  09/13/16 232 lb 9.6 oz (105.5 kg)  09/06/16 234 lb 9.6 oz (106.4 kg)      Impression:  The patient has tolerated radiotherapy.  Plan:  Routine follow-up in one month.  discussed skin care. Our nurse is making the appt currently for her ________________________________   Eppie Gibson, M.D.

## 2016-09-16 NOTE — Progress Notes (Signed)
Janet King has completed XRT to her right breast.  Note bright Erythema with intact skin except for a pustule like area at the top of her areola which is not draining at this time, but has a yellowish cast. Inframmary fold intact.  She reports level 2 pain In the lower axillary region..  She denies any fatigue at this time.  BP (!) 129/91 (BP Location: Left Arm, Patient Position: Sitting, Cuff Size: Large)   Pulse 99   Temp 98.2 F (36.8 C) (Oral)   Ht 5\' 6"  (1.676 m)   Wt 234 lb 9.6 oz (106.4 kg)   LMP 04/20/2016   BMI 37.87 kg/m    Wt Readings from Last 3 Encounters:  09/16/16 234 lb 9.6 oz (106.4 kg)  09/13/16 232 lb 9.6 oz (105.5 kg)  09/06/16 234 lb 9.6 oz (106.4 kg)

## 2016-09-16 NOTE — Addendum Note (Signed)
Encounter addended by: Benn Moulder, RN on: 09/16/2016  4:58 PM<BR>    Actions taken: Vitals modified

## 2016-09-19 ENCOUNTER — Other Ambulatory Visit (HOSPITAL_BASED_OUTPATIENT_CLINIC_OR_DEPARTMENT_OTHER): Payer: Commercial Managed Care - PPO

## 2016-09-19 ENCOUNTER — Ambulatory Visit (HOSPITAL_BASED_OUTPATIENT_CLINIC_OR_DEPARTMENT_OTHER): Payer: Commercial Managed Care - PPO

## 2016-09-19 ENCOUNTER — Encounter: Payer: Self-pay | Admitting: *Deleted

## 2016-09-19 VITALS — BP 141/85 | HR 82 | Temp 98.3°F

## 2016-09-19 DIAGNOSIS — C50211 Malignant neoplasm of upper-inner quadrant of right female breast: Secondary | ICD-10-CM | POA: Diagnosis not present

## 2016-09-19 DIAGNOSIS — Z5112 Encounter for antineoplastic immunotherapy: Secondary | ICD-10-CM

## 2016-09-19 LAB — COMPREHENSIVE METABOLIC PANEL
ALBUMIN: 3.4 g/dL — AB (ref 3.5–5.0)
ALK PHOS: 91 U/L (ref 40–150)
ALT: 16 U/L (ref 0–55)
AST: 16 U/L (ref 5–34)
Anion Gap: 7 mEq/L (ref 3–11)
BUN: 12.7 mg/dL (ref 7.0–26.0)
CHLORIDE: 108 meq/L (ref 98–109)
CO2: 25 mEq/L (ref 22–29)
Calcium: 9.1 mg/dL (ref 8.4–10.4)
Creatinine: 0.8 mg/dL (ref 0.6–1.1)
EGFR: 85 mL/min/{1.73_m2} — AB (ref >90)
GLUCOSE: 95 mg/dL (ref 70–140)
POTASSIUM: 3.8 meq/L (ref 3.5–5.1)
SODIUM: 140 meq/L (ref 136–145)
Total Bilirubin: 0.24 mg/dL (ref 0.20–1.20)
Total Protein: 7.3 g/dL (ref 6.4–8.3)

## 2016-09-19 LAB — CBC WITH DIFFERENTIAL/PLATELET
BASO%: 1 % (ref 0.0–2.0)
BASOS ABS: 0.1 10*3/uL (ref 0.0–0.1)
EOS ABS: 0.6 10*3/uL — AB (ref 0.0–0.5)
EOS%: 8.2 % — AB (ref 0.0–7.0)
HCT: 38.1 % (ref 34.8–46.6)
HEMOGLOBIN: 12.6 g/dL (ref 11.6–15.9)
LYMPH%: 21.3 % (ref 14.0–49.7)
MCH: 28.1 pg (ref 25.1–34.0)
MCHC: 33.1 g/dL (ref 31.5–36.0)
MCV: 84.8 fL (ref 79.5–101.0)
MONO#: 0.8 10*3/uL (ref 0.1–0.9)
MONO%: 9.5 % (ref 0.0–14.0)
NEUT#: 4.8 10*3/uL (ref 1.5–6.5)
NEUT%: 60 % (ref 38.4–76.8)
Platelets: 204 10*3/uL (ref 145–400)
RBC: 4.49 10*6/uL (ref 3.70–5.45)
RDW: 12.8 % (ref 11.2–14.5)
WBC: 8 10*3/uL (ref 3.9–10.3)
lymph#: 1.7 10*3/uL (ref 0.9–3.3)

## 2016-09-19 MED ORDER — DIPHENHYDRAMINE HCL 25 MG PO CAPS
ORAL_CAPSULE | ORAL | Status: AC
  Filled 2016-09-19: qty 1

## 2016-09-19 MED ORDER — SODIUM CHLORIDE 0.9% FLUSH
10.0000 mL | INTRAVENOUS | Status: DC | PRN
  Administered 2016-09-19: 10 mL
  Filled 2016-09-19: qty 10

## 2016-09-19 MED ORDER — DIPHENHYDRAMINE HCL 25 MG PO CAPS
25.0000 mg | ORAL_CAPSULE | Freq: Once | ORAL | Status: AC
  Administered 2016-09-19: 25 mg via ORAL

## 2016-09-19 MED ORDER — HEPARIN SOD (PORK) LOCK FLUSH 100 UNIT/ML IV SOLN
500.0000 [IU] | Freq: Once | INTRAVENOUS | Status: AC | PRN
  Administered 2016-09-19: 500 [IU]
  Filled 2016-09-19: qty 5

## 2016-09-19 MED ORDER — ACETAMINOPHEN 325 MG PO TABS
650.0000 mg | ORAL_TABLET | Freq: Once | ORAL | Status: AC
  Administered 2016-09-19: 650 mg via ORAL

## 2016-09-19 MED ORDER — ACETAMINOPHEN 325 MG PO TABS
ORAL_TABLET | ORAL | Status: AC
  Filled 2016-09-19: qty 2

## 2016-09-19 MED ORDER — TRASTUZUMAB CHEMO 150 MG IV SOLR
6.0000 mg/kg | Freq: Once | INTRAVENOUS | Status: AC
  Administered 2016-09-19: 651 mg via INTRAVENOUS
  Filled 2016-09-19: qty 31

## 2016-09-19 MED ORDER — SODIUM CHLORIDE 0.9 % IV SOLN
Freq: Once | INTRAVENOUS | Status: AC
  Administered 2016-09-19: 15:00:00 via INTRAVENOUS

## 2016-09-22 NOTE — Progress Notes (Signed)
  Radiation Oncology         [phone] ________________________________  Name: Janet King MRN: [id number]  Date: 09/16/2016  DOB: 1966/07/20  End of Treatment Note  Diagnosis:   Stage IA (pT1b, pN0) invasive lobular carcinoma of the right breast (triple positive)     Indication for treatment:  Curative and reduce the risk of recurrence with adjuvant chemotherapy  Radiation treatment dates: 08/10/16 - 09/16/16  Site/dose:   Right breast: 50.4 Gy in 28 fractions  Beams/energy:  3D // [employer]  Narrative: The patient tolerated radiation treatment relatively well. The patient denied pain, but only reported fatigue during treatment. Diffuse erythema throughout her right breast, no skin breakdown, non-tender.  Plan: The patient has completed radiation treatment. The patient will return to radiation oncology clinic for routine followup in one month. I advised them to call or return sooner if they have any questions or concerns related to their recovery or treatment.  -----------------------------------  Blair Promise, PhD, MD  This document serves as a record of services personally performed by Gery Pray, MD. It was created on his behalf by Darcus Austin, a trained medical scribe. The creation of this record is based on the scribe's personal observations and the provider's statements to them. This document has been checked and approved by the attending provider.

## 2016-09-30 ENCOUNTER — Ambulatory Visit (HOSPITAL_COMMUNITY)
Admission: RE | Admit: 2016-09-30 | Discharge: 2016-09-30 | Disposition: A | Discharge status: Home / Self Care | Payer: Commercial Managed Care - PPO | Source: Ambulatory Visit | Attending: Internal Medicine | Admitting: Internal Medicine

## 2016-09-30 ENCOUNTER — Encounter (HOSPITAL_COMMUNITY): Payer: Self-pay

## 2016-09-30 ENCOUNTER — Ambulatory Visit (HOSPITAL_BASED_OUTPATIENT_CLINIC_OR_DEPARTMENT_OTHER)
Admission: RE | Admit: 2016-09-30 | Discharge: 2016-09-30 | Disposition: A | Discharge status: Home / Self Care | Payer: Commercial Managed Care - PPO | Source: Ambulatory Visit | Attending: Internal Medicine | Admitting: Internal Medicine

## 2016-09-30 VITALS — BP 126/84 | HR 86 | Wt 234.8 lb

## 2016-09-30 DIAGNOSIS — E039 Hypothyroidism, unspecified: Secondary | ICD-10-CM | POA: Diagnosis not present

## 2016-09-30 DIAGNOSIS — Z79899 Other long term (current) drug therapy: Secondary | ICD-10-CM | POA: Insufficient documentation

## 2016-09-30 DIAGNOSIS — Z170 Estrogen receptor positive status [ER+]: Secondary | ICD-10-CM | POA: Insufficient documentation

## 2016-09-30 DIAGNOSIS — I427 Cardiomyopathy due to drug and external agent: Secondary | ICD-10-CM | POA: Diagnosis not present

## 2016-09-30 DIAGNOSIS — T451X5A Adverse effect of antineoplastic and immunosuppressive drugs, initial encounter: Secondary | ICD-10-CM | POA: Diagnosis not present

## 2016-09-30 DIAGNOSIS — Z880 Allergy status to penicillin: Secondary | ICD-10-CM | POA: Insufficient documentation

## 2016-09-30 DIAGNOSIS — C50211 Malignant neoplasm of upper-inner quadrant of right female breast: Secondary | ICD-10-CM | POA: Diagnosis not present

## 2016-09-30 DIAGNOSIS — Z803 Family history of malignant neoplasm of breast: Secondary | ICD-10-CM | POA: Diagnosis not present

## 2016-09-30 NOTE — Patient Instructions (Signed)
We will contact you in 3 months to schedule your next appointment.  

## 2016-09-30 NOTE — Progress Notes (Signed)
  Echocardiogram 2D Echocardiogram has been performed.  Tresa Res 09/30/2016, 11:50 AM

## 2016-10-01 NOTE — Progress Notes (Signed)
Patient ID: Janet King, female   DOB: [birth year], 50 y.o.   MRN: [id number]    Cardio-Oncology Clinic Consult Note   Referring Physician: Dr Lindi Adie  HPI:  Janet King is a 50 y.o. female with cancer of the right female breast diagnosed 3/17 referred by Dr. Lindi Adie for enrollment into the cardio-oncology clinic.   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 T2 N0 stage II a clinical stage  03/01/2016 Surgery Right lumpectomy: ILC, grade 2, 0.8 cm, ALH, additional superior margin : ILC grade to 0.3 cm , positive superior margin , 0/1 LN neg, mammoplasty bil : benign, ER 95%, PR 95%, HER-2 pos, Ki-67 5%, T1bN0 stage IA   She started chemotherapy including Herceptin in 5/17.   Overall doing well. Herceptin was held briefly, but she is now back on Herceptin.  She is taking Coreg and losartan.  No dyspnea, no chest pain.  Does not like taking Coreg and losartan but has had no specific side effects from them.     Echo 03/24/16 LVEF 65-70%, Ls' 12.0, GLS -19.3% Echo 06/27/16 LVEF 50%, lateral s' 7, GLS -17.2% Echo  08/02/16 60-65%  Lateral s' 11.2 GLS -18.0%  Echo 11/17 EF 55-60%, GLS -19.4%.    Review of Systems: All systems reviewed and negative except as per HPI.   Past Medical History:  Diagnosis Date  . Baker's cyst of knee   . Breast cancer ([place])    right  . H/O bone density study   . Hypothyroidism     Current Outpatient Prescriptions  Medication Sig Dispense Refill  . carvedilol (COREG) 3.125 MG tablet Take 1 tablet (3.125 mg total) by mouth 2 (two) times daily. 180 tablet 3  . cholecalciferol (VITAMIN D) 1000 units tablet Take 1,000 Units by mouth daily.    . hyaluronate sodium (RADIAPLEXRX) GEL Apply 1  application topically 2 (two) times daily.    Marland Kitchen lidocaine-prilocaine (EMLA) cream APPLY TOPICALLY TO THE AFFECTED AREA ONCE DAILY  3  . losartan (COZAAR) 25 MG tablet Take 1 tablet (25 mg total) by mouth daily. 30 tablet 6  . SYNTHROID 112 MCG tablet   0   No current facility-administered medications for this encounter.     Allergies  Allergen Reactions  . Penicillin G Rash      Social History   Social History  . Marital status: Single    Spouse name: N/A  . Number of children: 0  . Years of education: N/A   Occupational History  . 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 Smoking status: Never Smoker  . Smokeless tobacco: Never Used  . Alcohol use 0.0 oz/week     Comment: occ - once every 3 mos  . Drug use: No  . Sexual activity: Not Currently   Other Topics Concern  . Not on file   Social History Narrative  . No narrative on file      Family History  Problem Relation Age of Onset  . Breast cancer Mother 57  . Heart disease Maternal Grandfather   . Colon cancer Paternal Grandmother     dx. 25s  . Cancer Other     maternal great aunt (MGM's sister) dx. with NOS cancer at older  age  . Breast cancer Other     maternal great aunt (MGF's sister) dx. in her 110s; s/p mastectomy  . Cancer Other     paternal great aunt (PGM's sister) dx. with NOS cancer at older age    2:   09/30/16 1141  BP: 126/84  Pulse: 86  SpO2: 98%  Weight: 234 lb 12.8 oz (106.5 kg)    PHYSICAL EXAM: General:  Well appearing. No respiratory difficulty HEENT: normal Neck: supple. no JVD. Carotids 2+ bilat; no bruits. No lymphadenopathy or thryomegaly appreciated. Cor: PMI nondisplaced. Regular rate & rhythm. No rubs, gallops or murmurs. Lungs: clear Abdomen: Obese, soft, nontender, nondistended. No hepatosplenomegaly. No bruits or masses. Good bowel sounds. Extremities: no cyanosis, clubbing, rash, edema Neuro: alert & oriented x 3, cranial nerves  grossly intact. moves all 4 extremities w/o difficulty. Affect pleasant.   Assessment/ Plan   1. Breast cancer of upper-inner quadrant of right female breast: She started adjuvant therapy 5/17 with Taxol and Herceptin weekly x 12 followed by Herceptin maintenance every 3 weeks 1 year.  - Herceptin held after 8/17 echo due to question of early cardio-toxicity and was later restarted. - Coreg and losartan started.  - Today's echo is stable, normal EF and improved GLS.   - Continue Herceptin along with Coreg and losartan. - Repeat echo and followup in 3 months.    ,MD 10/01/2016

## 2016-10-10 ENCOUNTER — Ambulatory Visit (HOSPITAL_BASED_OUTPATIENT_CLINIC_OR_DEPARTMENT_OTHER): Payer: Commercial Managed Care - PPO

## 2016-10-10 VITALS — BP 117/85 | HR 105 | Temp 98.3°F | Resp 18

## 2016-10-10 DIAGNOSIS — C50211 Malignant neoplasm of upper-inner quadrant of right female breast: Secondary | ICD-10-CM | POA: Diagnosis not present

## 2016-10-10 DIAGNOSIS — Z5112 Encounter for antineoplastic immunotherapy: Secondary | ICD-10-CM

## 2016-10-10 MED ORDER — ACETAMINOPHEN 325 MG PO TABS
ORAL_TABLET | ORAL | Status: AC
  Filled 2016-10-10: qty 2

## 2016-10-10 MED ORDER — TRASTUZUMAB CHEMO 150 MG IV SOLR
6.0000 mg/kg | Freq: Once | INTRAVENOUS | Status: AC
  Administered 2016-10-10: 651 mg via INTRAVENOUS
  Filled 2016-10-10: qty 31

## 2016-10-10 MED ORDER — SODIUM CHLORIDE 0.9 % IV SOLN
Freq: Once | INTRAVENOUS | Status: AC
  Administered 2016-10-10: 15:00:00 via INTRAVENOUS

## 2016-10-10 MED ORDER — HEPARIN SOD (PORK) LOCK FLUSH 100 UNIT/ML IV SOLN
500.0000 [IU] | Freq: Once | INTRAVENOUS | Status: AC | PRN
  Administered 2016-10-10: 500 [IU]
  Filled 2016-10-10: qty 5

## 2016-10-10 MED ORDER — SODIUM CHLORIDE 0.9% FLUSH
10.0000 mL | INTRAVENOUS | Status: DC | PRN
  Administered 2016-10-10: 10 mL
  Filled 2016-10-10: qty 10

## 2016-10-10 MED ORDER — DIPHENHYDRAMINE HCL 25 MG PO CAPS
25.0000 mg | ORAL_CAPSULE | Freq: Once | ORAL | Status: AC
  Administered 2016-10-10: 25 mg via ORAL

## 2016-10-10 MED ORDER — DIPHENHYDRAMINE HCL 25 MG PO CAPS
ORAL_CAPSULE | ORAL | Status: AC
  Filled 2016-10-10: qty 1

## 2016-10-10 MED ORDER — ACETAMINOPHEN 325 MG PO TABS
650.0000 mg | ORAL_TABLET | Freq: Once | ORAL | Status: AC
  Administered 2016-10-10: 650 mg via ORAL

## 2016-10-10 NOTE — Patient Instructions (Signed)
[facility] Discharge Instructions for Patients Receiving Chemotherapy  Today you received the following chemotherapy agents herceptin   To help prevent nausea and vomiting after your treatment, we encourage you to take your nausea medication as directed   If you develop nausea and vomiting that is not controlled by your nausea medication, call the clinic.   BELOW ARE SYMPTOMS THAT SHOULD BE REPORTED IMMEDIATELY:  *FEVER GREATER THAN 100.5 F  *CHILLS WITH OR WITHOUT FEVER  NAUSEA AND VOMITING THAT IS NOT CONTROLLED WITH YOUR NAUSEA MEDICATION  *UNUSUAL SHORTNESS OF BREATH  *UNUSUAL BRUISING OR BLEEDING  TENDERNESS IN MOUTH AND THROAT WITH OR WITHOUT PRESENCE OF ULCERS  *URINARY PROBLEMS  *BOWEL PROBLEMS  UNUSUAL RASH Items with * indicate a potential emergency and should be followed up as soon as possible.  Feel free to call the clinic you have any questions or concerns. The clinic phone number is [phone].  

## 2016-10-19 ENCOUNTER — Encounter: Payer: Self-pay | Admitting: Oncology

## 2016-10-19 ENCOUNTER — Ambulatory Visit
Admission: RE | Admit: 2016-10-19 | Discharge: 2016-10-19 | Disposition: A | Discharge status: Home / Self Care | Payer: Commercial Managed Care - PPO | Source: Ambulatory Visit | Attending: Radiation Oncology | Admitting: Radiation Oncology

## 2016-10-19 VITALS — BP 137/95 | HR 96 | Temp 98.2°F | Resp 20 | Ht 66.0 in | Wt 234.0 lb

## 2016-10-19 DIAGNOSIS — C50211 Malignant neoplasm of upper-inner quadrant of right female breast: Secondary | ICD-10-CM

## 2016-10-19 DIAGNOSIS — Z170 Estrogen receptor positive status [ER+]: Secondary | ICD-10-CM | POA: Diagnosis not present

## 2016-10-19 DIAGNOSIS — Z923 Personal history of irradiation: Secondary | ICD-10-CM | POA: Insufficient documentation

## 2016-10-19 DIAGNOSIS — Z880 Allergy status to penicillin: Secondary | ICD-10-CM | POA: Insufficient documentation

## 2016-10-19 HISTORY — DX: Personal history of irradiation: Z92.3

## 2016-10-19 NOTE — Addendum Note (Signed)
Encounter addended by: Jacqulyn Liner, RN on: 10/19/2016  5:47 PM<BR>    Actions taken: Charge Capture section accepted

## 2016-10-19 NOTE — Progress Notes (Signed)
Miss Janet King is here for her one month appointment for right breast cancer. Denies pain to right breast.  Appetite is good.  Skin to right breast in tact with tanning using lotion with vitamin E daily. Will see Dr. Lindi Adie 10-31-16 has not started an anti-estrogen medication.  Able to raise right arm without difficulty. Wt Readings from Last 3 Encounters:  10/19/16 234 lb (106.1 kg)  09/30/16 234 lb 12.8 oz (106.5 kg)  09/16/16 234 lb (106.1 kg)  BP (!) 137/95   Pulse 96   Temp 98.2 F (36.8 C) (Oral)   Resp 20   Ht 5\' 6"  (1.676 m)   Wt 234 lb (106.1 kg)   SpO2 100%   BMI 37.77 kg/m

## 2016-10-19 NOTE — Progress Notes (Signed)
Radiation Oncology         [phone] ________________________________  Name: Janet King MRN: [id number]  Date: 10/19/2016  DOB: [birth year]  Follow-Up Visit Note  CC: ADKINS,GRETCHEN, MD  [name], MD    ICD-9-CM ICD-10-CM   1. Malignant neoplasm of upper-inner quadrant of right breast in female, estrogen receptor positive ([place]) 174.2 C50.211    V86.0 Z17.0     Diagnosis:  Stage IA (pT1b, pN0) invasive lobular carcinoma of the right breast (triple positive)     Interval Since Last Radiation: 1 month  08/10/16 - 09/16/16: Right breast: 50.4 Gy in 28 fractions  Narrative:  The patient returns today for routine follow-up. The patient is currently undergoing Herceptin maintenance, it was only held once. The patient denies pain or fatigue of the right breast and has a good appetite. The nurse notes the skin to the right breast is intact with tanning. She is using Vitamin E daily. She is able to raise her right arm without difficulty. She saw her surgeon and had a good report yesterday.  ALLERGIES:  is allergic to penicillin g.  Meds: Current Outpatient Prescriptions  Medication Sig Dispense Refill  . carvedilol (COREG) 3.125 MG tablet Take 1 tablet (3.125 mg total) by mouth 2 (two) times daily. 180 tablet 3  . cholecalciferol (VITAMIN D) 1000 units tablet Take 1,000 Units by mouth daily.    Marland Kitchen lidocaine-prilocaine (EMLA) cream APPLY TOPICALLY TO THE AFFECTED AREA ONCE DAILY  3  . losartan (COZAAR) 25 MG tablet Take 1 tablet (25 mg total) by mouth daily. 30 tablet 6  . SYNTHROID 112 MCG tablet   0   No current facility-administered medications for this encounter.     Physical Findings: The patient is in no acute distress. Patient is alert and oriented.  height is 5\' 6"  (1.676 m) and weight is 234 lb (106.1 kg). Her oral temperature is 98.2 F (36.8 C). Her blood pressure is 137/95 (abnormal) and her pulse is 96. Her respiration is 20 and oxygen saturation is 100%.     Lungs are clear to auscultation bilaterally. Heart has regular rate and rhythm. No palpable cervical, supraclavicular, or axillary adenopathy. Left breast no palpable mass or discharge, reduction mammoplasty scars in place. Right breast the patient continues to have erythema along the lower aspect of the breast, some edema in the breast, no dominant mass appreaciated in the breast, no nipple discharge or bleeding. Small area of crusting about 1 cm in size in the central breast area along her reduction mammoplasty scars with no drainage, present since her surgery.   Lab Findings: Lab Results  Component Value Date   WBC 8.0 09/19/2016   HGB 12.6 09/19/2016   HCT 38.1 09/19/2016   MCV 84.8 09/19/2016   PLT 204 09/19/2016    Radiographic Findings: No results found.  Impression:  The patient is recovering well from the effects of radiation. No sign of recurrence on clinical exam.  Plan: The patient is scheduled to follow up with Dr. Lindi Adie on 10/31/16 to discuss antiestrogen therapy and continue Herceptin. She will follow up in Radiation Oncology in 6 months. ____________________________________ -----------------------------------  Blair Promise, PhD, MD  This document serves as a record of services personally performed by Gery Pray, MD. It was created on his behalf by Darcus Austin, a trained medical scribe. The creation of this record is based on the scribe's personal observations and the provider's statements to them. This document has been checked and approved by  the attending provider.

## 2016-10-31 ENCOUNTER — Other Ambulatory Visit (HOSPITAL_BASED_OUTPATIENT_CLINIC_OR_DEPARTMENT_OTHER): Payer: Commercial Managed Care - PPO

## 2016-10-31 ENCOUNTER — Encounter: Payer: Self-pay | Admitting: Hematology and Oncology

## 2016-10-31 ENCOUNTER — Ambulatory Visit (HOSPITAL_BASED_OUTPATIENT_CLINIC_OR_DEPARTMENT_OTHER): Payer: Commercial Managed Care - PPO

## 2016-10-31 ENCOUNTER — Ambulatory Visit (HOSPITAL_BASED_OUTPATIENT_CLINIC_OR_DEPARTMENT_OTHER): Payer: Commercial Managed Care - PPO | Admitting: Hematology and Oncology

## 2016-10-31 DIAGNOSIS — C50211 Malignant neoplasm of upper-inner quadrant of right female breast: Secondary | ICD-10-CM

## 2016-10-31 DIAGNOSIS — Z5112 Encounter for antineoplastic immunotherapy: Secondary | ICD-10-CM

## 2016-10-31 DIAGNOSIS — Z170 Estrogen receptor positive status [ER+]: Secondary | ICD-10-CM

## 2016-10-31 LAB — COMPREHENSIVE METABOLIC PANEL
ALT: 21 U/L (ref 0–55)
ANION GAP: 9 meq/L (ref 3–11)
AST: 23 U/L (ref 5–34)
Albumin: 3.5 g/dL (ref 3.5–5.0)
Alkaline Phosphatase: 97 U/L (ref 40–150)
BUN: 15.3 mg/dL (ref 7.0–26.0)
CHLORIDE: 107 meq/L (ref 98–109)
CO2: 24 meq/L (ref 22–29)
CREATININE: 0.9 mg/dL (ref 0.6–1.1)
Calcium: 9.4 mg/dL (ref 8.4–10.4)
EGFR: 80 mL/min/{1.73_m2} — AB (ref >90)
Glucose: 91 mg/dl (ref 70–140)
POTASSIUM: 3.9 meq/L (ref 3.5–5.1)
Sodium: 140 mEq/L (ref 136–145)
Total Bilirubin: 0.33 mg/dL (ref 0.20–1.20)
Total Protein: 7.6 g/dL (ref 6.4–8.3)

## 2016-10-31 LAB — CBC WITH DIFFERENTIAL/PLATELET
BASO%: 0.9 % (ref 0.0–2.0)
BASOS ABS: 0.1 10*3/uL (ref 0.0–0.1)
EOS%: 8.3 % — ABNORMAL HIGH (ref 0.0–7.0)
Eosinophils Absolute: 0.7 10*3/uL — ABNORMAL HIGH (ref 0.0–0.5)
HCT: 37.8 % (ref 34.8–46.6)
HGB: 12.7 g/dL (ref 11.6–15.9)
LYMPH%: 26.1 % (ref 14.0–49.7)
MCH: 28 pg (ref 25.1–34.0)
MCHC: 33.6 g/dL (ref 31.5–36.0)
MCV: 83.4 fL (ref 79.5–101.0)
MONO#: 0.5 10*3/uL (ref 0.1–0.9)
MONO%: 6.4 % (ref 0.0–14.0)
NEUT#: 4.7 10*3/uL (ref 1.5–6.5)
NEUT%: 58.3 % (ref 38.4–76.8)
PLATELETS: 195 10*3/uL (ref 145–400)
RBC: 4.53 10*6/uL (ref 3.70–5.45)
RDW: 13 % (ref 11.2–14.5)
WBC: 8.1 10*3/uL (ref 3.9–10.3)
lymph#: 2.1 10*3/uL (ref 0.9–3.3)

## 2016-10-31 MED ORDER — DIPHENHYDRAMINE HCL 25 MG PO CAPS
ORAL_CAPSULE | ORAL | Status: AC
  Filled 2016-10-31: qty 1

## 2016-10-31 MED ORDER — TRASTUZUMAB CHEMO 150 MG IV SOLR
6.0000 mg/kg | Freq: Once | INTRAVENOUS | Status: AC
  Administered 2016-10-31: 651 mg via INTRAVENOUS
  Filled 2016-10-31: qty 31

## 2016-10-31 MED ORDER — ACETAMINOPHEN 325 MG PO TABS
650.0000 mg | ORAL_TABLET | Freq: Once | ORAL | Status: AC
  Administered 2016-10-31: 650 mg via ORAL

## 2016-10-31 MED ORDER — HEPARIN SOD (PORK) LOCK FLUSH 100 UNIT/ML IV SOLN
500.0000 [IU] | Freq: Once | INTRAVENOUS | Status: AC | PRN
  Administered 2016-10-31: 500 [IU]
  Filled 2016-10-31: qty 5

## 2016-10-31 MED ORDER — SODIUM CHLORIDE 0.9% FLUSH
10.0000 mL | INTRAVENOUS | Status: DC | PRN
  Administered 2016-10-31: 10 mL
  Filled 2016-10-31: qty 10

## 2016-10-31 MED ORDER — SODIUM CHLORIDE 0.9 % IV SOLN
Freq: Once | INTRAVENOUS | Status: AC
  Administered 2016-10-31: 15:00:00 via INTRAVENOUS

## 2016-10-31 MED ORDER — TAMOXIFEN CITRATE 20 MG PO TABS: 20.0000 mg | ORAL_TABLET | Freq: Every day | ORAL | 3 refills | Status: DC

## 2016-10-31 MED ORDER — DIPHENHYDRAMINE HCL 25 MG PO CAPS
25.0000 mg | ORAL_CAPSULE | Freq: Once | ORAL | Status: AC
  Administered 2016-10-31: 25 mg via ORAL

## 2016-10-31 MED ORDER — ACETAMINOPHEN 325 MG PO TABS
ORAL_TABLET | ORAL | Status: AC
  Filled 2016-10-31: qty 2

## 2016-10-31 NOTE — Assessment & Plan Note (Signed)
Right lumpectomy 03/01/2016: ILC, grade 2, 0.8 cm, ALH, additional superior margin : ILC grade to 0.3 cm , positive superior margin , 0/1 LN neg, mammoplasty bil : benign, ER 95%, PR 95%, HER-2 positive on primary tumor and negative on secondary mass which had a ratio of 1.55, gene copy number is 2.25, Ki-67 5%, T1bN0 stage IA  HER-2: HER-2 was positive in the original tumor and the HER-2 on the second excision was negative. I was able to get this clarification from pathology.  Reexcision of superior margin: 5 2017: Benign  Treatment plan: 1. Adjuvant therapy with Taxol and Herceptin weekly 12  completed 07/06/2016 2. Followed by Herceptin maintenance every 3 weeks 1 year (was on hold 06/29/2016 to 08/08/2016 due to a decrease in ejection fraction) 2. Followed adjuvant radiation 08/10/2016 to 09/16/2016 3. Antiestrogen therapy ----------------------------------------------------------------------------------------------------------------------------- Current treatment: Adjuvant Herceptin every 3 weeks until the end of June 2017 (being resumed today after holding it for low EF) Echocardiogram 08/02/2016: EF 55-60 %  Letrozole counseling: We discussed the risks and benefits of anti-estrogen therapy with aromatase inhibitors. These include but not limited to insomnia, hot flashes, mood changes, vaginal dryness, bone density loss, and weight gain. We strongly believe that the benefits far outweigh the risks. Patient understands these risks and consented to starting treatment. Planned treatment duration is 5 years.  Return to clinic every 3 weeks for Herceptin every 6 weeks for follow-up with [place].

## 2016-10-31 NOTE — Patient Instructions (Signed)
[facility] Discharge Instructions for Patients Receiving Chemotherapy  Today you received the following chemotherapy agents: Herceptin   To help prevent nausea and vomiting after your treatment, we encourage you to take your nausea medication as directed.    If you develop nausea and vomiting that is not controlled by your nausea medication, call the clinic.   BELOW ARE SYMPTOMS THAT SHOULD BE REPORTED IMMEDIATELY:  *FEVER GREATER THAN 100.5 F  *CHILLS WITH OR WITHOUT FEVER  NAUSEA AND VOMITING THAT IS NOT CONTROLLED WITH YOUR NAUSEA MEDICATION  *UNUSUAL SHORTNESS OF BREATH  *UNUSUAL BRUISING OR BLEEDING  TENDERNESS IN MOUTH AND THROAT WITH OR WITHOUT PRESENCE OF ULCERS  *URINARY PROBLEMS  *BOWEL PROBLEMS  UNUSUAL RASH Items with * indicate a potential emergency and should be followed up as soon as possible.  Feel free to call the clinic you have any questions or concerns. The clinic phone number is [phone].  Please show the CHEMO ALERT CARD at check-in to the Emergency Department and triage nurse.   

## 2016-10-31 NOTE — Progress Notes (Signed)
Patient Care Team: [name], MD as PCP - General (Obstetrics and Gynecology)  DIAGNOSIS:  Encounter Diagnosis  Name Primary?  . Malignant neoplasm of upper-inner quadrant of right breast in female, estrogen receptor positive ([place])     SUMMARY OF ONCOLOGIC HISTORY:   Breast cancer of upper-inner quadrant of right female breast ([place])   01/21/2016 Initial Diagnosis    Right breast biopsy: Invasive lobular cancer, ER 95%, PR 95%, Ki-67 5%, HER-2 positive ratio 2.52      02/01/2016 Breast MRI    Right breast: Post biopsy hematoma measuring 1.9 x 1.3 x 2.5 cm surrounding ring of reactive enhancement, no lymphadenopathy. T2 N0 stage II a clinical stage      03/01/2016 Surgery    Right lumpectomy: ILC, grade 2, 0.8 cm, ALH, additional superior margin : ILC grade to 0.3 cm , positive superior margin , 0/1 LN neg, mammoplasty bil : benign, ER 95%, PR 95%, HER-2 pos, Ki-67 5%, T1bN0 stage IA      03/15/2016 Surgery    Right breast. Margin excision: Benign      04/20/2016 -  Chemotherapy    Taxol Herceptin weekly 12 followed by Herceptin maintenance for 1 year      08/10/2016 - 09/16/2016 Radiation Therapy    Adjuvant radiation therapy       CHIEF COMPLIANT: Continuation of Herceptin treatment, completion of radiation therapy  INTERVAL HISTORY: Janet King is a 50 year old with above-mentioned history of right breast cancer treated with lumpectomy followed by adjuvant chemotherapy with Taxol and Herceptin. She is currently on Herceptin maintenance. She is tolerating Herceptin extremely well. She completed adjuvant radiation therapy as well. She is recovered from the radiation. She is also here to discuss starting antiestrogen therapy.  REVIEW OF SYSTEMS:   Constitutional: Denies fevers, chills or abnormal weight loss Eyes: Denies blurriness of vision Ears, nose, mouth, throat, and face: Denies mucositis or sore throat Respiratory: Denies cough, dyspnea or  wheezes Cardiovascular: Denies palpitation, chest discomfort Gastrointestinal:  Denies nausea, heartburn or change in bowel habits Skin: Denies abnormal skin rashes Lymphatics: Denies new lymphadenopathy or easy bruising Neurological:Denies numbness, tingling or new weaknesses Behavioral/Psych: Mood is stable, no new changes  Extremities: No lower extremity edema Breast:  Prior right lumpectomy and radiation All other systems were reviewed with the patient and are negative.  I have reviewed the past medical history, past surgical history, social history and family history with the patient and they are unchanged from previous note.  ALLERGIES:  is allergic to penicillin g.  MEDICATIONS:  Current Outpatient Prescriptions  Medication Sig Dispense Refill  . carvedilol (COREG) 3.125 MG tablet Take 1 tablet (3.125 mg total) by mouth 2 (two) times daily. 180 tablet 3  . cholecalciferol (VITAMIN D) 1000 units tablet Take 1,000 Units by mouth daily.    Marland Kitchen lidocaine-prilocaine (EMLA) cream APPLY TOPICALLY TO THE AFFECTED AREA ONCE DAILY  3  . losartan (COZAAR) 25 MG tablet Take 1 tablet (25 mg total) by mouth daily. 30 tablet 6  . SYNTHROID 112 MCG tablet   0  . tamoxifen (NOLVADEX) 20 MG tablet Take 1 tablet (20 mg total) by mouth daily. 90 tablet 3   No current facility-administered medications for this visit.     PHYSICAL EXAMINATION: ECOG PERFORMANCE STATUS: 1 - Symptomatic but completely ambulatory  Vitals:   10/31/16 1352  BP: 120/80  Pulse: 77  Resp: 18  Temp: 98 F (36.7 C)   Filed Weights   10/31/16 1352  Weight: 235 lb 11.2 oz (106.9 kg)    GENERAL:alert, no distress and comfortable SKIN: skin color, texture, turgor are normal, no rashes or significant lesions EYES: normal, Conjunctiva are pink and non-injected, sclera clear OROPHARYNX:no exudate, no erythema and lips, buccal mucosa, and tongue normal  NECK: supple, thyroid normal size, non-tender, without  nodularity LYMPH:  no palpable lymphadenopathy in the cervical, axillary or inguinal LUNGS: clear to auscultation and percussion with normal breathing effort HEART: regular rate & rhythm and no murmurs and no lower extremity edema ABDOMEN:abdomen soft, non-tender and normal bowel sounds MUSCULOSKELETAL:no cyanosis of digits and no clubbing  NEURO: alert & oriented x 3 with fluent speech, no focal motor/sensory deficits EXTREMITIES: No lower extremity edema LABORATORY DATA:  I have reviewed the data as listed   Chemistry      Component Value Date/Time   NA 140 09/19/2016 1429   K 3.8 09/19/2016 1429   CL 111 04/17/2007 0925   CO2 25 09/19/2016 1429   BUN 12.7 09/19/2016 1429   CREATININE 0.8 09/19/2016 1429      Component Value Date/Time   CALCIUM 9.1 09/19/2016 1429   ALKPHOS 91 09/19/2016 1429   AST 16 09/19/2016 1429   ALT 16 09/19/2016 1429   BILITOT 0.24 09/19/2016 1429       Lab Results  Component Value Date   WBC 8.1 10/31/2016   HGB 12.7 10/31/2016   HCT 37.8 10/31/2016   MCV 83.4 10/31/2016   PLT 195 10/31/2016   NEUTROABS 4.7 10/31/2016    ASSESSMENT & PLAN:  Breast cancer of upper-inner quadrant of right female breast ([place]) Right lumpectomy 03/01/2016: ILC, grade 2, 0.8 cm, ALH, additional superior margin : ILC grade to 0.3 cm , positive superior margin , 0/1 LN neg, mammoplasty bil : benign, ER 95%, PR 95%, HER-2 positive on primary tumor and negative on secondary mass which had a ratio of 1.55, gene copy number is 2.25, Ki-67 5%, T1bN0 stage IA  HER-2: HER-2 was positive in the original tumor and the HER-2 on the second excision was negative. I was able to get this clarification from pathology.  Reexcision of superior margin: 5 2017: Benign  Treatment plan: 1. Adjuvant therapy with Taxol and Herceptin weekly 12  completed 07/06/2016 2. Followed by Herceptin maintenance every 3 weeks 1 year (was on hold 06/29/2016 to 08/08/2016 due to a decrease in  ejection fraction) 2. Followed adjuvant radiation 08/10/2016 to 09/16/2016 3. Antiestrogen therapy With tamoxifen to start January 2018 (patient's last menstrual cycle was June 2017) ----------------------------------------------------------------------------------------------------------------------------- Current treatment: Adjuvant Herceptin every 3 weeks until the end of June 2017  Echocardiogram 08/02/2016: EF 55-60 %  Tamoxifen counseling: We discussed the risks and benefits of tamoxifen. These include but not limited to insomnia, hot flashes, mood changes, vaginal dryness, and weight gain. Although rare, serious side effects including endometrial cancer, risk of blood clots were also discussed. We strongly believe that the benefits far outweigh the risks. Patient understands these risks and consented to starting treatment. Planned treatment duration is 5-10 years.  Patient last word [place] after she finished with Herceptin Return to clinic every 3 weeks for Herceptin every 6 weeks for follow-up with [place].    Orders Placed This Encounter  Procedures  . MM DIAG BREAST TOMO BILATERAL    Standing Status:   Future    Standing Expiration Date:   01/01/2018    Order Specific Question:   Reason for Exam (SYMPTOM  OR DIAGNOSIS REQUIRED)    Answer:  Annual diagnostic mammogram with breast cancer history    Order Specific Question:   Is the patient pregnant?    Answer:   No    Order Specific Question:   Preferred imaging location?    Answer:   [facility]   The patient has a good understanding of the overall plan. she agrees with it. she will call with any problems that may develop before the next visit here.   Rulon Eisenmenger, MD 10/31/16

## 2016-11-03 ENCOUNTER — Telehealth (HOSPITAL_COMMUNITY): Payer: Self-pay | Admitting: Vascular Surgery

## 2016-11-03 NOTE — Telephone Encounter (Signed)
Left pt message to make f/u appt w/ echo 

## 2016-11-16 ENCOUNTER — Telehealth (HOSPITAL_COMMUNITY): Payer: Self-pay | Admitting: Vascular Surgery

## 2016-11-16 NOTE — Telephone Encounter (Signed)
Left pot message to make f/u appt w/ echo

## 2016-11-21 ENCOUNTER — Other Ambulatory Visit: Payer: Commercial Managed Care - PPO

## 2016-11-21 ENCOUNTER — Ambulatory Visit (HOSPITAL_BASED_OUTPATIENT_CLINIC_OR_DEPARTMENT_OTHER): Payer: Commercial Managed Care - PPO

## 2016-11-21 VITALS — BP 143/82 | HR 102 | Temp 97.2°F | Resp 18

## 2016-11-21 DIAGNOSIS — C50211 Malignant neoplasm of upper-inner quadrant of right female breast: Secondary | ICD-10-CM

## 2016-11-21 DIAGNOSIS — Z5112 Encounter for antineoplastic immunotherapy: Secondary | ICD-10-CM

## 2016-11-21 MED ORDER — DIPHENHYDRAMINE HCL 25 MG PO CAPS
25.0000 mg | ORAL_CAPSULE | Freq: Once | ORAL | Status: AC
  Administered 2016-11-21: 25 mg via ORAL

## 2016-11-21 MED ORDER — ACETAMINOPHEN 325 MG PO TABS
ORAL_TABLET | ORAL | Status: AC
  Filled 2016-11-21: qty 2

## 2016-11-21 MED ORDER — SODIUM CHLORIDE 0.9 % IV SOLN
6.0000 mg/kg | Freq: Once | INTRAVENOUS | Status: AC
  Administered 2016-11-21: 651 mg via INTRAVENOUS
  Filled 2016-11-21: qty 31

## 2016-11-21 MED ORDER — ACETAMINOPHEN 325 MG PO TABS
650.0000 mg | ORAL_TABLET | Freq: Once | ORAL | Status: AC
  Administered 2016-11-21: 650 mg via ORAL

## 2016-11-21 MED ORDER — SODIUM CHLORIDE 0.9 % IV SOLN
Freq: Once | INTRAVENOUS | Status: AC
  Administered 2016-11-21: 16:00:00 via INTRAVENOUS

## 2016-11-21 MED ORDER — DIPHENHYDRAMINE HCL 25 MG PO CAPS
ORAL_CAPSULE | ORAL | Status: AC
  Filled 2016-11-21: qty 1

## 2016-11-21 MED ORDER — SODIUM CHLORIDE 0.9% FLUSH
10.0000 mL | INTRAVENOUS | Status: DC | PRN
  Administered 2016-11-21: 10 mL
  Filled 2016-11-21: qty 10

## 2016-11-21 MED ORDER — HEPARIN SOD (PORK) LOCK FLUSH 100 UNIT/ML IV SOLN
500.0000 [IU] | Freq: Once | INTRAVENOUS | Status: AC | PRN
  Administered 2016-11-21: 500 [IU]
  Filled 2016-11-21: qty 5

## 2016-11-21 NOTE — Patient Instructions (Signed)
[facility] Discharge Instructions for Patients Receiving Chemotherapy  Today you received the following chemotherapy agents Herceptin  To help prevent nausea and vomiting after your treatment, we encourage you to take your nausea medication as needed   If you develop nausea and vomiting that is not controlled by your nausea medication, call the clinic.   BELOW ARE SYMPTOMS THAT SHOULD BE REPORTED IMMEDIATELY:  *FEVER GREATER THAN 100.5 F  *CHILLS WITH OR WITHOUT FEVER  NAUSEA AND VOMITING THAT IS NOT CONTROLLED WITH YOUR NAUSEA MEDICATION  *UNUSUAL SHORTNESS OF BREATH  *UNUSUAL BRUISING OR BLEEDING  TENDERNESS IN MOUTH AND THROAT WITH OR WITHOUT PRESENCE OF ULCERS  *URINARY PROBLEMS  *BOWEL PROBLEMS  UNUSUAL RASH Items with * indicate a potential emergency and should be followed up as soon as possible.  Feel free to call the clinic you have any questions or concerns. The clinic phone number is [phone].  Please show the CHEMO ALERT CARD at check-in to the Emergency Department and triage nurse.   

## 2016-12-11 NOTE — Assessment & Plan Note (Signed)
Right lumpectomy 03/01/2016: ILC, grade 2, 0.8 cm, ALH, additional superior margin : ILC grade to 0.3 cm , positive superior margin , 0/1 LN neg, mammoplasty bil : benign, ER 95%, PR 95%, HER-2 positive on primary tumor and negative on secondary mass which had a ratio of 1.55, gene copy number is 2.25, Ki-67 5%, T1bN0 stage IA  HER-2: HER-2 was positive in the original tumor and the HER-2 on the second excision was negative. I was able to get this clarification from pathology.  Reexcision of superior margin: 5 2017: Benign  Treatment plan: 1. Adjuvant therapy with Taxol and Herceptin weekly 12 completed 07/06/2016 2. Followed by Herceptin maintenance every 3 weeks 1 year (was on hold 06/29/2016 to 08/08/2016 due to a decrease in ejection fraction) 2. Followedadjuvant radiation 08/10/2016 to 09/16/2016 3. Antiestrogen therapy With tamoxifen to start January 2018 (patient's last menstrual cycle was June 2017) ----------------------------------------------------------------------------------------------------------------------------- Current treatment: Adjuvant Herceptin every 3 weeks until the end of June 2017  Echocardiogram 08/02/2016: EF 55-60 %  Tamoxifen Toxicities:  RTC in 6 months

## 2016-12-12 ENCOUNTER — Other Ambulatory Visit: Payer: Self-pay | Admitting: Plastic Surgery

## 2016-12-12 ENCOUNTER — Ambulatory Visit (HOSPITAL_BASED_OUTPATIENT_CLINIC_OR_DEPARTMENT_OTHER): Payer: Commercial Managed Care - PPO | Admitting: Hematology and Oncology

## 2016-12-12 ENCOUNTER — Other Ambulatory Visit (HOSPITAL_BASED_OUTPATIENT_CLINIC_OR_DEPARTMENT_OTHER): Payer: Commercial Managed Care - PPO

## 2016-12-12 ENCOUNTER — Ambulatory Visit (HOSPITAL_BASED_OUTPATIENT_CLINIC_OR_DEPARTMENT_OTHER): Payer: Commercial Managed Care - PPO

## 2016-12-12 ENCOUNTER — Encounter: Payer: Self-pay | Admitting: Hematology and Oncology

## 2016-12-12 DIAGNOSIS — Z5112 Encounter for antineoplastic immunotherapy: Secondary | ICD-10-CM

## 2016-12-12 DIAGNOSIS — C50211 Malignant neoplasm of upper-inner quadrant of right female breast: Secondary | ICD-10-CM | POA: Diagnosis not present

## 2016-12-12 DIAGNOSIS — Z79810 Long term (current) use of selective estrogen receptor modulators (SERMs): Secondary | ICD-10-CM | POA: Diagnosis not present

## 2016-12-12 DIAGNOSIS — Z170 Estrogen receptor positive status [ER+]: Secondary | ICD-10-CM | POA: Diagnosis not present

## 2016-12-12 DIAGNOSIS — N644 Mastodynia: Secondary | ICD-10-CM

## 2016-12-12 DIAGNOSIS — N63 Unspecified lump in unspecified breast: Secondary | ICD-10-CM

## 2016-12-12 LAB — CBC WITH DIFFERENTIAL/PLATELET
BASO%: 0.4 % (ref 0.0–2.0)
Basophils Absolute: 0 10*3/uL (ref 0.0–0.1)
EOS ABS: 0.6 10*3/uL — AB (ref 0.0–0.5)
EOS%: 5.5 % (ref 0.0–7.0)
HCT: 36.4 % (ref 34.8–46.6)
HEMOGLOBIN: 11.9 g/dL (ref 11.6–15.9)
LYMPH%: 19.9 % (ref 14.0–49.7)
MCH: 27.8 pg (ref 25.1–34.0)
MCHC: 32.7 g/dL (ref 31.5–36.0)
MCV: 85 fL (ref 79.5–101.0)
MONO#: 0.5 10*3/uL (ref 0.1–0.9)
MONO%: 4.5 % (ref 0.0–14.0)
NEUT%: 69.7 % (ref 38.4–76.8)
NEUTROS ABS: 7.4 10*3/uL — AB (ref 1.5–6.5)
Platelets: 201 10*3/uL (ref 145–400)
RBC: 4.28 10*6/uL (ref 3.70–5.45)
RDW: 13.7 % (ref 11.2–14.5)
WBC: 10.6 10*3/uL — AB (ref 3.9–10.3)
lymph#: 2.1 10*3/uL (ref 0.9–3.3)

## 2016-12-12 LAB — COMPREHENSIVE METABOLIC PANEL
ALBUMIN: 3.4 g/dL — AB (ref 3.5–5.0)
ALK PHOS: 81 U/L (ref 40–150)
ALT: 11 U/L (ref 0–55)
AST: 10 U/L (ref 5–34)
Anion Gap: 9 mEq/L (ref 3–11)
BILIRUBIN TOTAL: 0.43 mg/dL (ref 0.20–1.20)
BUN: 17.3 mg/dL (ref 7.0–26.0)
CO2: 23 mEq/L (ref 22–29)
Calcium: 9.3 mg/dL (ref 8.4–10.4)
Chloride: 108 mEq/L (ref 98–109)
Creatinine: 0.9 mg/dL (ref 0.6–1.1)
EGFR: 80 mL/min/{1.73_m2} — ABNORMAL LOW (ref >90)
Glucose: 131 mg/dl (ref 70–140)
Potassium: 3.8 mEq/L (ref 3.5–5.1)
SODIUM: 140 meq/L (ref 136–145)
TOTAL PROTEIN: 7.4 g/dL (ref 6.4–8.3)

## 2016-12-12 MED ORDER — VANCOMYCIN HCL 1000 MG IV SOLR
1000.0000 mg | Freq: Once | INTRAVENOUS | Status: DC
  Filled 2016-12-12: qty 1000

## 2016-12-12 MED ORDER — HEPARIN SOD (PORK) LOCK FLUSH 100 UNIT/ML IV SOLN
500.0000 [IU] | Freq: Once | INTRAVENOUS | Status: AC | PRN
  Administered 2016-12-12: 500 [IU]
  Filled 2016-12-12: qty 5

## 2016-12-12 MED ORDER — TRASTUZUMAB CHEMO 150 MG IV SOLR
6.0000 mg/kg | Freq: Once | INTRAVENOUS | Status: AC
  Administered 2016-12-12: 651 mg via INTRAVENOUS
  Filled 2016-12-12: qty 31

## 2016-12-12 MED ORDER — VANCOMYCIN HCL IN DEXTROSE 1-5 GM/200ML-% IV SOLN
1000.0000 mg | Freq: Once | INTRAVENOUS | Status: AC
  Administered 2016-12-12: 1000 mg via INTRAVENOUS
  Filled 2016-12-12: qty 200

## 2016-12-12 MED ORDER — ACETAMINOPHEN 325 MG PO TABS
ORAL_TABLET | ORAL | Status: AC
  Filled 2016-12-12: qty 2

## 2016-12-12 MED ORDER — SODIUM CHLORIDE 0.9 % IV SOLN
Freq: Once | INTRAVENOUS | Status: AC
  Administered 2016-12-12: 16:00:00 via INTRAVENOUS

## 2016-12-12 MED ORDER — ACETAMINOPHEN 325 MG PO TABS
650.0000 mg | ORAL_TABLET | Freq: Once | ORAL | Status: AC
  Administered 2016-12-12: 650 mg via ORAL

## 2016-12-12 MED ORDER — VANCOMYCIN HCL 1000 MG IV SOLR: 1000.0000 mg | Freq: Once | INTRAVENOUS | Status: DC

## 2016-12-12 MED ORDER — SODIUM CHLORIDE 0.9% FLUSH
10.0000 mL | INTRAVENOUS | Status: DC | PRN
  Administered 2016-12-12: 10 mL
  Filled 2016-12-12: qty 10

## 2016-12-12 MED ORDER — DIPHENHYDRAMINE HCL 25 MG PO CAPS
ORAL_CAPSULE | ORAL | Status: AC
  Filled 2016-12-12: qty 1

## 2016-12-12 MED ORDER — DIPHENHYDRAMINE HCL 25 MG PO CAPS
25.0000 mg | ORAL_CAPSULE | Freq: Once | ORAL | Status: AC
  Administered 2016-12-12: 25 mg via ORAL

## 2016-12-12 NOTE — Patient Instructions (Signed)
[facility] Discharge Instructions for Patients Receiving Chemotherapy  Today you received the following chemotherapy agents: Herceptin   To help prevent nausea and vomiting after your treatment, we encourage you to take your nausea medication as directed.    If you develop nausea and vomiting that is not controlled by your nausea medication, call the clinic.   BELOW ARE SYMPTOMS THAT SHOULD BE REPORTED IMMEDIATELY:  *FEVER GREATER THAN 100.5 F  *CHILLS WITH OR WITHOUT FEVER  NAUSEA AND VOMITING THAT IS NOT CONTROLLED WITH YOUR NAUSEA MEDICATION  *UNUSUAL SHORTNESS OF BREATH  *UNUSUAL BRUISING OR BLEEDING  TENDERNESS IN MOUTH AND THROAT WITH OR WITHOUT PRESENCE OF ULCERS  *URINARY PROBLEMS  *BOWEL PROBLEMS  UNUSUAL RASH Items with * indicate a potential emergency and should be followed up as soon as possible.  Feel free to call the clinic you have any questions or concerns. The clinic phone number is [phone].  Please show the CHEMO ALERT CARD at check-in to the Emergency Department and triage nurse.   

## 2016-12-12 NOTE — Progress Notes (Signed)
Patient Care Team: [name], MD as PCP - General (Obstetrics and Gynecology)  DIAGNOSIS:  Encounter Diagnosis  Name Primary?  . Malignant neoplasm of upper-inner quadrant of right breast in female, estrogen receptor positive ([place])     SUMMARY OF ONCOLOGIC HISTORY:   Breast cancer of upper-inner quadrant of right female breast ([place])   01/21/2016 Initial Diagnosis    Right breast biopsy: Invasive lobular cancer, ER 95%, PR 95%, Ki-67 5%, HER-2 positive ratio 2.52      02/01/2016 Breast MRI    Right breast: Post biopsy hematoma measuring 1.9 x 1.3 x 2.5 cm surrounding ring of reactive enhancement, no lymphadenopathy. T2 N0 stage II a clinical stage      03/01/2016 Surgery    Right lumpectomy: ILC, grade 2, 0.8 cm, ALH, additional superior margin : ILC grade to 0.3 cm , positive superior margin , 0/1 LN neg, mammoplasty bil : benign, ER 95%, PR 95%, HER-2 pos, Ki-67 5%, T1bN0 stage IA      03/15/2016 Surgery    Right breast. Margin excision: Benign      04/20/2016 -  Chemotherapy    Taxol Herceptin weekly 12 followed by Herceptin maintenance for 1 year      08/10/2016 - 09/16/2016 Radiation Therapy    Adjuvant radiation therapy       CHIEF COMPLIANT: Follow-up on Herceptin, complaining of breast infection  INTERVAL HISTORY: Janet King is a 51 year old with above-mentioned history of right breast cancer treated with lumpectomy followed by adjuvant chemotherapy. Today she is on Herceptin maintenance. She completed radiation and started on tamoxifen therapy in December. She has not had any major problems from tamoxifen at this point. But when she started she had profound I swelling and made it difficult to put contact lens. Her hot flashes are very minimal. She does not have any myalgias. Initially her eyebrows and eyelashes fell and she was very upset about this.  REVIEW OF SYSTEMS:   Constitutional: Denies fevers, chills or abnormal weight loss Eyes: Denies blurriness  of vision Ears, nose, mouth, throat, and face: Denies mucositis or sore throat Respiratory: Denies cough, dyspnea or wheezes Cardiovascular: Denies palpitation, chest discomfort Gastrointestinal:  Denies nausea, heartburn or change in bowel habits Skin: Denies abnormal skin rashes Lymphatics: Denies new lymphadenopathy or easy bruising Neurological:Denies numbness, tingling or new weaknesses Behavioral/Psych: Mood is stable, no new changes  Extremities: No lower extremity edema All other systems were reviewed with the patient and are negative.  I have reviewed the past medical history, past surgical history, social history and family history with the patient and they are unchanged from previous note.  ALLERGIES:  is allergic to penicillin g.  MEDICATIONS:  Current Outpatient Prescriptions  Medication Sig Dispense Refill  . carvedilol (COREG) 3.125 MG tablet Take 1 tablet (3.125 mg total) by mouth 2 (two) times daily. 180 tablet 3  . cholecalciferol (VITAMIN D) 1000 units tablet Take 1,000 Units by mouth daily.    Marland Kitchen lidocaine-prilocaine (EMLA) cream APPLY TOPICALLY TO THE AFFECTED AREA ONCE DAILY  3  . losartan (COZAAR) 25 MG tablet Take 1 tablet (25 mg total) by mouth daily. 30 tablet 6  . SYNTHROID 112 MCG tablet   0  . tamoxifen (NOLVADEX) 20 MG tablet Take 1 tablet (20 mg total) by mouth daily. 90 tablet 3   No current facility-administered medications for this visit.     PHYSICAL EXAMINATION: ECOG PERFORMANCE STATUS: 1 - Symptomatic but completely ambulatory  Vitals:   12/12/16 1513  BP: 125/73  Pulse: 98  Resp: 18  Temp: 98.4 F (36.9 C)   Filed Weights   12/12/16 1513  Weight: 235 lb 14.4 oz (107 kg)    GENERAL:alert, no distress and comfortable SKIN: skin color, texture, turgor are normal, no rashes or significant lesions EYES: normal, Conjunctiva are pink and non-injected, sclera clear OROPHARYNX:no exudate, no erythema and lips, buccal mucosa, and tongue  normal  NECK: supple, thyroid normal size, non-tender, without nodularity LYMPH:  no palpable lymphadenopathy in the cervical, axillary or inguinal LUNGS: clear to auscultation and percussion with normal breathing effort HEART: regular rate & rhythm and no murmurs and no lower extremity edema ABDOMEN:abdomen soft, non-tender and normal bowel sounds MUSCULOSKELETAL:no cyanosis of digits and no clubbing  NEURO: alert & oriented x 3 with fluent speech, no focal motor/sensory deficits EXTREMITIES: No lower extremity edema  LABORATORY DATA:  I have reviewed the data as listed   Chemistry      Component Value Date/Time   NA 140 12/12/2016 1500   K 3.8 12/12/2016 1500   CL 111 04/17/2007 0925   CO2 23 12/12/2016 1500   BUN 17.3 12/12/2016 1500   CREATININE 0.9 12/12/2016 1500      Component Value Date/Time   CALCIUM 9.3 12/12/2016 1500   ALKPHOS 81 12/12/2016 1500   AST 10 12/12/2016 1500   ALT 11 12/12/2016 1500   BILITOT 0.43 12/12/2016 1500       Lab Results  Component Value Date   WBC 10.6 (H) 12/12/2016   HGB 11.9 12/12/2016   HCT 36.4 12/12/2016   MCV 85.0 12/12/2016   PLT 201 12/12/2016   NEUTROABS 7.4 (H) 12/12/2016    ASSESSMENT & PLAN:  Breast cancer of upper-inner quadrant of right female breast ([place]) Right lumpectomy 03/01/2016: ILC, grade 2, 0.8 cm, ALH, additional superior margin : ILC grade to 0.3 cm , positive superior margin , 0/1 LN neg, mammoplasty bil : benign, ER 95%, PR 95%, HER-2 positive on primary tumor and negative on secondary mass which had a ratio of 1.55, gene copy number is 2.25, Ki-67 5%, T1bN0 stage IA  HER-2: HER-2 was positive in the original tumor and the HER-2 on the second excision was negative. I was able to get this clarification from pathology.  Reexcision of superior margin: 5 2017: Benign  Treatment plan: 1. Adjuvant therapy with Taxol and Herceptin weekly 12 completed 07/06/2016 2. Followed by Herceptin maintenance every 3  weeks 1 year (was on hold 06/29/2016 to 08/08/2016 due to a decrease in ejection fraction) 2. Followedadjuvant radiation 08/10/2016 to 09/16/2016 3. Antiestrogen therapy With tamoxifen to start January 2018 (patient's last menstrual cycle was June 2017) ----------------------------------------------------------------------------------------------------------------------------- Current treatment: Adjuvant Herceptin every 3 weeks until the end of June 2017  Echocardiogram 08/02/2016: EF 55-60 % Herceptin toxicities: Patient does not have any side effects to Herceptin.  Tamoxifen Toxicities: Denies any hot flashes or myalgias. Initially she had loss of eyelashes and eyebrows when she was very sad about it.  Breast infection: We will administer a dose of vancomycin today. She was prescribed antibiotics by Dr.Thimappa.  RTC in 6 weeks   I spent 25 minutes talking to the patient of which more than half was spent in counseling and coordination of care.  No orders of the defined types were placed in this encounter.  The patient has a good understanding of the overall plan. she agrees with it. she will call with any problems that may develop before the next visit here.  Rulon Eisenmenger, MD 12/12/16

## 2016-12-14 ENCOUNTER — Ambulatory Visit
Admission: RE | Admit: 2016-12-14 | Discharge: 2016-12-14 | Disposition: A | Discharge status: Home / Self Care | Payer: Commercial Managed Care - PPO | Source: Ambulatory Visit | Attending: Plastic Surgery | Admitting: Plastic Surgery

## 2016-12-14 ENCOUNTER — Other Ambulatory Visit: Payer: Self-pay | Admitting: Endocrinology

## 2016-12-14 ENCOUNTER — Other Ambulatory Visit: Payer: Self-pay | Admitting: Emergency Medicine

## 2016-12-14 DIAGNOSIS — N63 Unspecified lump in unspecified breast: Secondary | ICD-10-CM

## 2016-12-14 DIAGNOSIS — E049 Nontoxic goiter, unspecified: Secondary | ICD-10-CM

## 2016-12-14 DIAGNOSIS — N644 Mastodynia: Secondary | ICD-10-CM

## 2016-12-15 ENCOUNTER — Other Ambulatory Visit: Payer: Self-pay | Admitting: Emergency Medicine

## 2016-12-15 ENCOUNTER — Other Ambulatory Visit: Payer: Self-pay | Admitting: Hematology and Oncology

## 2016-12-15 DIAGNOSIS — N644 Mastodynia: Secondary | ICD-10-CM

## 2016-12-15 DIAGNOSIS — N63 Unspecified lump in unspecified breast: Secondary | ICD-10-CM

## 2016-12-15 DIAGNOSIS — N611 Abscess of the breast and nipple: Secondary | ICD-10-CM

## 2016-12-16 ENCOUNTER — Ambulatory Visit
Admission: RE | Admit: 2016-12-16 | Discharge: 2016-12-16 | Disposition: A | Discharge status: Home / Self Care | Payer: Commercial Managed Care - PPO | Source: Ambulatory Visit | Attending: Hematology and Oncology | Admitting: Hematology and Oncology

## 2016-12-16 ENCOUNTER — Other Ambulatory Visit: Payer: Self-pay | Admitting: Hematology and Oncology

## 2016-12-16 DIAGNOSIS — N611 Abscess of the breast and nipple: Secondary | ICD-10-CM

## 2016-12-16 DIAGNOSIS — N644 Mastodynia: Secondary | ICD-10-CM

## 2016-12-16 DIAGNOSIS — N63 Unspecified lump in unspecified breast: Secondary | ICD-10-CM

## 2016-12-26 ENCOUNTER — Other Ambulatory Visit: Payer: Commercial Managed Care - PPO

## 2017-01-02 ENCOUNTER — Other Ambulatory Visit: Payer: Commercial Managed Care - PPO

## 2017-01-02 ENCOUNTER — Ambulatory Visit (HOSPITAL_BASED_OUTPATIENT_CLINIC_OR_DEPARTMENT_OTHER): Payer: Commercial Managed Care - PPO

## 2017-01-02 VITALS — BP 139/71 | HR 83 | Temp 97.8°F | Resp 20

## 2017-01-02 DIAGNOSIS — Z5111 Encounter for antineoplastic chemotherapy: Secondary | ICD-10-CM

## 2017-01-02 DIAGNOSIS — C50211 Malignant neoplasm of upper-inner quadrant of right female breast: Secondary | ICD-10-CM

## 2017-01-02 MED ORDER — ACETAMINOPHEN 325 MG PO TABS
ORAL_TABLET | ORAL | Status: AC
  Filled 2017-01-02: qty 2

## 2017-01-02 MED ORDER — DIPHENHYDRAMINE HCL 25 MG PO CAPS
25.0000 mg | ORAL_CAPSULE | Freq: Once | ORAL | Status: AC
  Administered 2017-01-02: 25 mg via ORAL

## 2017-01-02 MED ORDER — SODIUM CHLORIDE 0.9 % IV SOLN
Freq: Once | INTRAVENOUS | Status: AC
  Administered 2017-01-02: 16:00:00 via INTRAVENOUS

## 2017-01-02 MED ORDER — ACETAMINOPHEN 325 MG PO TABS
650.0000 mg | ORAL_TABLET | Freq: Once | ORAL | Status: AC
  Administered 2017-01-02: 650 mg via ORAL

## 2017-01-02 MED ORDER — TRASTUZUMAB CHEMO 150 MG IV SOLR
6.0000 mg/kg | Freq: Once | INTRAVENOUS | Status: AC
  Administered 2017-01-02: 651 mg via INTRAVENOUS
  Filled 2017-01-02: qty 31

## 2017-01-02 MED ORDER — DIPHENHYDRAMINE HCL 25 MG PO CAPS
ORAL_CAPSULE | ORAL | Status: AC
  Filled 2017-01-02: qty 1

## 2017-01-02 MED ORDER — HEPARIN SOD (PORK) LOCK FLUSH 100 UNIT/ML IV SOLN
500.0000 [IU] | Freq: Once | INTRAVENOUS | Status: AC | PRN
  Administered 2017-01-02: 500 [IU]
  Filled 2017-01-02: qty 5

## 2017-01-02 MED ORDER — SODIUM CHLORIDE 0.9% FLUSH
10.0000 mL | INTRAVENOUS | Status: DC | PRN
  Administered 2017-01-02: 10 mL
  Filled 2017-01-02: qty 10

## 2017-01-02 NOTE — Patient Instructions (Signed)
[facility] Discharge Instructions for Patients Receiving Chemotherapy  Today you received the following chemotherapy agents: Herceptin   To help prevent nausea and vomiting after your treatment, we encourage you to take your nausea medication as directed.    If you develop nausea and vomiting that is not controlled by your nausea medication, call the clinic.   BELOW ARE SYMPTOMS THAT SHOULD BE REPORTED IMMEDIATELY:  *FEVER GREATER THAN 100.5 F  *CHILLS WITH OR WITHOUT FEVER  NAUSEA AND VOMITING THAT IS NOT CONTROLLED WITH YOUR NAUSEA MEDICATION  *UNUSUAL SHORTNESS OF BREATH  *UNUSUAL BRUISING OR BLEEDING  TENDERNESS IN MOUTH AND THROAT WITH OR WITHOUT PRESENCE OF ULCERS  *URINARY PROBLEMS  *BOWEL PROBLEMS  UNUSUAL RASH Items with * indicate a potential emergency and should be followed up as soon as possible.  Feel free to call the clinic you have any questions or concerns. The clinic phone number is [phone].  Please show the CHEMO ALERT CARD at check-in to the Emergency Department and triage nurse.   

## 2017-01-16 ENCOUNTER — Ambulatory Visit (HOSPITAL_BASED_OUTPATIENT_CLINIC_OR_DEPARTMENT_OTHER)
Admission: RE | Admit: 2017-01-16 | Discharge: 2017-01-16 | Disposition: A | Discharge status: Home / Self Care | Payer: Commercial Managed Care - PPO | Source: Ambulatory Visit | Attending: Cardiology | Admitting: Cardiology

## 2017-01-16 ENCOUNTER — Encounter (HOSPITAL_COMMUNITY): Payer: Self-pay

## 2017-01-16 ENCOUNTER — Ambulatory Visit (HOSPITAL_COMMUNITY)
Admission: RE | Admit: 2017-01-16 | Discharge: 2017-01-16 | Disposition: A | Discharge status: Home / Self Care | Payer: Commercial Managed Care - PPO | Source: Ambulatory Visit | Attending: Internal Medicine | Admitting: Internal Medicine

## 2017-01-16 VITALS — BP 123/61 | HR 84 | Wt 238.2 lb

## 2017-01-16 DIAGNOSIS — Z8249 Family history of ischemic heart disease and other diseases of the circulatory system: Secondary | ICD-10-CM | POA: Diagnosis not present

## 2017-01-16 DIAGNOSIS — C50211 Malignant neoplasm of upper-inner quadrant of right female breast: Secondary | ICD-10-CM | POA: Diagnosis present

## 2017-01-16 DIAGNOSIS — T451X5A Adverse effect of antineoplastic and immunosuppressive drugs, initial encounter: Secondary | ICD-10-CM | POA: Diagnosis not present

## 2017-01-16 DIAGNOSIS — I340 Nonrheumatic mitral (valve) insufficiency: Secondary | ICD-10-CM | POA: Diagnosis not present

## 2017-01-16 DIAGNOSIS — Z800 Family history of malignant neoplasm of digestive organs: Secondary | ICD-10-CM | POA: Diagnosis not present

## 2017-01-16 DIAGNOSIS — Z880 Allergy status to penicillin: Secondary | ICD-10-CM | POA: Diagnosis not present

## 2017-01-16 DIAGNOSIS — Z803 Family history of malignant neoplasm of breast: Secondary | ICD-10-CM | POA: Insufficient documentation

## 2017-01-16 DIAGNOSIS — I313 Pericardial effusion (noninflammatory): Secondary | ICD-10-CM | POA: Insufficient documentation

## 2017-01-16 DIAGNOSIS — I5030 Unspecified diastolic (congestive) heart failure: Secondary | ICD-10-CM | POA: Diagnosis not present

## 2017-01-16 DIAGNOSIS — I427 Cardiomyopathy due to drug and external agent: Secondary | ICD-10-CM

## 2017-01-16 DIAGNOSIS — M7120 Synovial cyst of popliteal space [Baker], unspecified knee: Secondary | ICD-10-CM | POA: Diagnosis not present

## 2017-01-16 DIAGNOSIS — I501 Left ventricular failure: Secondary | ICD-10-CM | POA: Insufficient documentation

## 2017-01-16 DIAGNOSIS — E039 Hypothyroidism, unspecified: Secondary | ICD-10-CM | POA: Insufficient documentation

## 2017-01-16 NOTE — Progress Notes (Signed)
Patient ID: Janet King, female   DOB: [birth year], 51 y.o.   MRN: [id number]    Cardio-Oncology Clinic Consult Note   Referring Physician: Dr Lindi Adie  HPI:  LOREAN King is a 51 y.o. female with cancer of the right female breast diagnosed 3/17 referred by Dr. Lindi Adie for enrollment into the cardio-oncology clinic.   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 T2 N0 stage II a clinical stage  03/01/2016 Surgery Right lumpectomy: ILC, grade 2, 0.8 cm, ALH, additional superior margin : ILC grade to 0.3 cm , positive superior margin , 0/1 LN neg, mammoplasty bil : benign, ER 95%, PR 95%, HER-2 pos, Ki-67 5%, T1bN0 stage IA   She started chemotherapy including Herceptin in 5/17.   Overall doing well. Herceptin was held briefly, but she is now back on Herceptin.  She is taking Coreg and losartan at low doses.  No dyspnea, no chest pain.  Does not like taking Coreg and losartan but has had no specific side effects from them.     Echo 03/24/16 LVEF 65-70%, Ls' 12.0, GLS -19.3% Echo 06/27/16 LVEF 50%, lateral s' 7, GLS -17.2% Echo  08/02/16 60-65%  Lateral s' 11.2 GLS -18.0%  Echo 11/17 EF 55-60%, GLS -19.4%.   Echo 3/18 EF 55-60%, GLS -19.5%  Review of Systems: All systems reviewed and negative except as per HPI.   Past Medical History:  Diagnosis Date  . Baker's cyst of knee   . Breast cancer ([place])    right  . H/O bone density study   . History of radiation therapy 08/10/16-09/16/16   right breast 50.4 Gy  . Hypothyroidism     Current Outpatient Prescriptions  Medication Sig Dispense Refill  . carvedilol (COREG) 3.125 MG tablet Take 1 tablet (3.125 mg total) by mouth 2 (two) times daily. 180 tablet 3  . Cholecalciferol  (VITAMIN D3) 50000 units TABS Take 100,000 Units by mouth once a week.    . lidocaine-prilocaine (EMLA) cream APPLY TOPICALLY TO THE AFFECTED AREA ONCE DAILY  3  . losartan (COZAAR) 25 MG tablet Take 1 tablet (25 mg total) by mouth daily. 30 tablet 6  . SYNTHROID 112 MCG tablet   0  . tamoxifen (NOLVADEX) 20 MG tablet Take 1 tablet (20 mg total) by mouth daily. 90 tablet 3   No current facility-administered medications for this encounter.     Allergies  Allergen Reactions  . Penicillin G Rash      Social History   Social History  . Marital status: Single    Spouse name: N/A  . Number of children: 0  . Years of education: N/A   Occupational History  . 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 Smoking status: Never Smoker  . Smokeless tobacco: Never Used  . Alcohol use 0.0 oz/week     Comment: occ - once every 3 mos  . Drug use: No  . Sexual activity: Not Currently   Other Topics Concern  . Not on file   Social History Narrative  . No narrative on file      Family History  Problem Relation Age of Onset  . Breast cancer Mother 4  . Heart disease Maternal Grandfather   . Colon cancer  Paternal Grandmother     dx. 62s  . Cancer Other     maternal great aunt (MGM's sister) dx. with NOS cancer at older age  . Breast cancer Other     maternal great aunt (MGF's sister) dx. in her 14s; s/p mastectomy  . Cancer Other     paternal great aunt (PGM's sister) dx. with NOS cancer at older age    55:   01/16/17 1139  BP: 123/61  Pulse: 84  SpO2: 100%  Weight: 238 lb 4 oz (108.1 kg)    PHYSICAL EXAM: General:  Well appearing. No respiratory difficulty HEENT: normal Neck: supple. no JVD. Carotids 2+ bilat; no bruits. No lymphadenopathy or thryomegaly appreciated. Cor: PMI nondisplaced. Regular rate & rhythm. No rubs, gallops or murmurs. Lungs: clear Abdomen: Obese, soft, nontender, nondistended. No hepatosplenomegaly. No bruits or masses.  Good bowel sounds. Extremities: no cyanosis, clubbing, rash, edema Neuro: alert & oriented x 3, cranial nerves grossly intact. moves all 4 extremities w/o difficulty. Affect pleasant.   Assessment/ Plan   1. Breast cancer of upper-inner quadrant of right female breast: She started adjuvant therapy 5/17 with Taxol and Herceptin weekly x 12 followed by Herceptin maintenance every 3 weeks 1 year.  Herceptin held after 8/17 echo due to question of early cardio-toxicity and was later restarted. Today's echo reviewed.  It is stable, normal EF and unchanged GLS.   - Continue Herceptin along with Coreg and losartan. - Repeat echo and followup in 3 months.   Dalton McLean,MD 01/16/2017

## 2017-01-16 NOTE — Patient Instructions (Signed)
We will contact you in 3 months to schedule your next appointment and echocardiogram  

## 2017-01-23 ENCOUNTER — Ambulatory Visit (HOSPITAL_BASED_OUTPATIENT_CLINIC_OR_DEPARTMENT_OTHER): Payer: Commercial Managed Care - PPO

## 2017-01-23 ENCOUNTER — Other Ambulatory Visit (HOSPITAL_BASED_OUTPATIENT_CLINIC_OR_DEPARTMENT_OTHER): Payer: Commercial Managed Care - PPO

## 2017-01-23 ENCOUNTER — Ambulatory Visit (HOSPITAL_BASED_OUTPATIENT_CLINIC_OR_DEPARTMENT_OTHER): Payer: Commercial Managed Care - PPO | Admitting: Hematology and Oncology

## 2017-01-23 ENCOUNTER — Encounter: Payer: Self-pay | Admitting: Hematology and Oncology

## 2017-01-23 DIAGNOSIS — C50211 Malignant neoplasm of upper-inner quadrant of right female breast: Secondary | ICD-10-CM

## 2017-01-23 DIAGNOSIS — Z79810 Long term (current) use of selective estrogen receptor modulators (SERMs): Secondary | ICD-10-CM

## 2017-01-23 DIAGNOSIS — Z170 Estrogen receptor positive status [ER+]: Secondary | ICD-10-CM

## 2017-01-23 DIAGNOSIS — Z5112 Encounter for antineoplastic immunotherapy: Secondary | ICD-10-CM | POA: Diagnosis not present

## 2017-01-23 LAB — CBC WITH DIFFERENTIAL/PLATELET
BASO%: 0.7 % (ref 0.0–2.0)
Basophils Absolute: 0.1 10*3/uL (ref 0.0–0.1)
EOS%: 4.6 % (ref 0.0–7.0)
Eosinophils Absolute: 0.4 10*3/uL (ref 0.0–0.5)
HCT: 38.3 % (ref 34.8–46.6)
HGB: 12.8 g/dL (ref 11.6–15.9)
LYMPH%: 23.7 % (ref 14.0–49.7)
MCH: 28.5 pg (ref 25.1–34.0)
MCHC: 33.4 g/dL (ref 31.5–36.0)
MCV: 85.3 fL (ref 79.5–101.0)
MONO#: 0.7 10*3/uL (ref 0.1–0.9)
MONO%: 7.7 % (ref 0.0–14.0)
NEUT%: 63.3 % (ref 38.4–76.8)
NEUTROS ABS: 6 10*3/uL (ref 1.5–6.5)
Platelets: 199 10*3/uL (ref 145–400)
RBC: 4.49 10*6/uL (ref 3.70–5.45)
RDW: 13.1 % (ref 11.2–14.5)
WBC: 9.5 10*3/uL (ref 3.9–10.3)
lymph#: 2.2 10*3/uL (ref 0.9–3.3)

## 2017-01-23 LAB — COMPREHENSIVE METABOLIC PANEL
ALT: 13 U/L (ref 0–55)
ANION GAP: 8 meq/L (ref 3–11)
AST: 13 U/L (ref 5–34)
Albumin: 3.6 g/dL (ref 3.5–5.0)
Alkaline Phosphatase: 78 U/L (ref 40–150)
BILIRUBIN TOTAL: 0.31 mg/dL (ref 0.20–1.20)
BUN: 16 mg/dL (ref 7.0–26.0)
CHLORIDE: 109 meq/L (ref 98–109)
CO2: 24 mEq/L (ref 22–29)
Calcium: 9.3 mg/dL (ref 8.4–10.4)
Creatinine: 0.8 mg/dL (ref 0.6–1.1)
EGFR: 81 mL/min/{1.73_m2} — AB (ref >90)
GLUCOSE: 89 mg/dL (ref 70–140)
Potassium: 4.1 mEq/L (ref 3.5–5.1)
SODIUM: 141 meq/L (ref 136–145)
TOTAL PROTEIN: 7.4 g/dL (ref 6.4–8.3)

## 2017-01-23 MED ORDER — DIPHENHYDRAMINE HCL 25 MG PO CAPS
25.0000 mg | ORAL_CAPSULE | Freq: Once | ORAL | Status: AC
  Administered 2017-01-23: 25 mg via ORAL

## 2017-01-23 MED ORDER — ACETAMINOPHEN 325 MG PO TABS
ORAL_TABLET | ORAL | Status: AC
  Filled 2017-01-23: qty 2

## 2017-01-23 MED ORDER — SODIUM CHLORIDE 0.9% FLUSH
10.0000 mL | INTRAVENOUS | Status: DC | PRN
  Administered 2017-01-23: 10 mL
  Filled 2017-01-23: qty 10

## 2017-01-23 MED ORDER — CIPROFLOXACIN HCL 500 MG PO TABS: 500.0000 mg | ORAL_TABLET | Freq: Two times a day (BID) | ORAL | 1 refills | Status: DC

## 2017-01-23 MED ORDER — DIPHENHYDRAMINE HCL 25 MG PO CAPS
ORAL_CAPSULE | ORAL | Status: AC
  Filled 2017-01-23: qty 1

## 2017-01-23 MED ORDER — TRASTUZUMAB CHEMO 150 MG IV SOLR
6.0000 mg/kg | Freq: Once | INTRAVENOUS | Status: AC
  Administered 2017-01-23: 651 mg via INTRAVENOUS
  Filled 2017-01-23: qty 31

## 2017-01-23 MED ORDER — SODIUM CHLORIDE 0.9 % IV SOLN
Freq: Once | INTRAVENOUS | Status: AC
  Administered 2017-01-23: 16:00:00 via INTRAVENOUS

## 2017-01-23 MED ORDER — HEPARIN SOD (PORK) LOCK FLUSH 100 UNIT/ML IV SOLN
500.0000 [IU] | Freq: Once | INTRAVENOUS | Status: AC | PRN
  Administered 2017-01-23: 500 [IU]
  Filled 2017-01-23: qty 5

## 2017-01-23 MED ORDER — ACETAMINOPHEN 325 MG PO TABS
650.0000 mg | ORAL_TABLET | Freq: Once | ORAL | Status: AC
  Administered 2017-01-23: 650 mg via ORAL

## 2017-01-23 NOTE — Assessment & Plan Note (Signed)
Right lumpectomy 03/01/2016: ILC, grade 2, 0.8 cm, ALH, additional superior margin : ILC grade to 0.3 cm , positive superior margin , 0/1 LN neg, mammoplasty bil : benign, ER 95%, PR 95%, HER-2 positive on primary tumor and negative on secondary mass which had a ratio of 1.55, gene copy number is 2.25, Ki-67 5%, T1bN0 stage IA  HER-2: HER-2 was positive in the original tumor and the HER-2 on the second excision was negative. I was able to get this clarification from pathology.  Reexcision of superior margin: 5 2017: Benign  Treatment plan: 1. Adjuvant therapy with Taxol and Herceptin weekly 12 completed 07/06/2016 2. Followed by Herceptin maintenance every 3 weeks 1 year (was on hold 06/29/2016 to 08/08/2016 due to a decrease in ejection fraction) 2. Followedadjuvant radiation 08/10/2016 to 09/16/2016 3. Antiestrogen therapy With tamoxifen to start January 2018 (patient's last menstrual cycle was June 2017) ----------------------------------------------------------------------------------------------------------------------------- Current treatment: Adjuvant Herceptin every 3 weeks until the end of June 2017  Echocardiogram 08/02/2016: EF 55-60 % Herceptin toxicities: Patient does not have any side effects to Herceptin.  Tamoxifen Toxicities: Denies any hot flashes or myalgias. Initially she had loss of eyelashes and eyebrows when she was very sad about it.  Breast infection: Resolved  Return to clinic every 3 weeks for Herceptin every 6 weeks for follow-up with me

## 2017-01-23 NOTE — Progress Notes (Signed)
Patient Care Team: [name], MD as PCP - General (Obstetrics and Gynecology)  DIAGNOSIS:  Encounter Diagnosis  Name Primary?  . Malignant neoplasm of upper-inner quadrant of right breast in female, estrogen receptor positive ([place])     SUMMARY OF ONCOLOGIC HISTORY:   Breast cancer of upper-inner quadrant of right female breast ([place])   01/21/2016 Initial Diagnosis    Right breast biopsy: Invasive lobular cancer, ER 95%, PR 95%, Ki-67 5%, HER-2 positive ratio 2.52      02/01/2016 Breast MRI    Right breast: Post biopsy hematoma measuring 1.9 x 1.3 x 2.5 cm surrounding ring of reactive enhancement, no lymphadenopathy. T2 N0 stage II a clinical stage      03/01/2016 Surgery    Right lumpectomy: ILC, grade 2, 0.8 cm, ALH, additional superior margin : ILC grade to 0.3 cm , positive superior margin , 0/1 LN neg, mammoplasty bil : benign, ER 95%, PR 95%, HER-2 pos, Ki-67 5%, T1bN0 stage IA      03/15/2016 Surgery    Right breast. Margin excision: Benign      04/20/2016 -  Chemotherapy    Taxol Herceptin weekly 12 followed by Herceptin maintenance for 1 year      08/10/2016 - 09/16/2016 Radiation Therapy    Adjuvant radiation therapy      10/26/2016 -  Anti-estrogen oral therapy    Tamoxifen 20 mg daily       CHIEF COMPLIANT: Follow-up on tamoxifen therapy  INTERVAL HISTORY: Janet King is a 51 year old with above-mentioned history right breast cancer treated with lumpectomy followed by adjuvant chemotherapy radiation and is currently on tamoxifen since December 2017. She has had mild hot flashes. She had lost some hair on the eyebrows from tamoxifen. She continues to have profound sweats in the nighttime. These are bothering her.. Denies any lumps or nodules in the breast. Today she also complains of redness in the right breast similar to previous. This started on Friday and got worse over the weekend. Previously this was treated with oral ciprofloxacin.  REVIEW OF  SYSTEMS:   Constitutional: Denies fevers, chills or abnormal weight loss Eyes: Denies blurriness of vision Ears, nose, mouth, throat, and face: Denies mucositis or sore throat Respiratory: Denies cough, dyspnea or wheezes Cardiovascular: Denies palpitation, chest discomfort Gastrointestinal:  Denies nausea, heartburn or change in bowel habits Skin: Denies abnormal skin rashes Lymphatics: Denies new lymphadenopathy or easy bruising Neurological:Denies numbness, tingling or new weaknesses Behavioral/Psych: Mood is stable, no new changes  Extremities: No lower extremity edema Breast:  Redness of the right breast from cellulitis All other systems were reviewed with the patient and are negative.  I have reviewed the past medical history, past surgical history, social history and family history with the patient and they are unchanged from previous note.  ALLERGIES:  is allergic to penicillin g.  MEDICATIONS:  Current Outpatient Prescriptions  Medication Sig Dispense Refill  . carvedilol (COREG) 3.125 MG tablet Take 1 tablet (3.125 mg total) by mouth 2 (two) times daily. 180 tablet 3  . Cholecalciferol (VITAMIN D3) 50000 units TABS Take 100,000 Units by mouth once a week.    . ciprofloxacin (CIPRO) 500 MG tablet Take 1 tablet (500 mg total) by mouth 2 (two) times daily. 14 tablet 1  . lidocaine-prilocaine (EMLA) cream APPLY TOPICALLY TO THE AFFECTED AREA ONCE DAILY  3  . losartan (COZAAR) 25 MG tablet Take 1 tablet (25 mg total) by mouth daily. 30 tablet 6  . SYNTHROID 112 MCG  tablet   0  . tamoxifen (NOLVADEX) 20 MG tablet Take 1 tablet (20 mg total) by mouth daily. 90 tablet 3   No current facility-administered medications for this visit.     PHYSICAL EXAMINATION: ECOG PERFORMANCE STATUS: 1 - Symptomatic but completely ambulatory  Vitals:   01/23/17 1504  BP: 116/64  Pulse: 99  Resp: 20  Temp: 98.4 F (36.9 C)   Filed Weights   01/23/17 1504  Weight: 239 lb 8 oz (108.6 kg)     GENERAL:alert, no distress and comfortable SKIN: skin color, texture, turgor are normal, no rashes or significant lesions EYES: normal, Conjunctiva are pink and non-injected, sclera clear OROPHARYNX:no exudate, no erythema and lips, buccal mucosa, and tongue normal  NECK: supple, thyroid normal size, non-tender, without nodularity LYMPH:  no palpable lymphadenopathy in the cervical, axillary or inguinal LUNGS: clear to auscultation and percussion with normal breathing effort HEART: regular rate & rhythm and no murmurs and no lower extremity edema ABDOMEN:abdomen soft, non-tender and normal bowel sounds MUSCULOSKELETAL:no cyanosis of digits and no clubbing  NEURO: alert & oriented x 3 with fluent speech, no focal motor/sensory deficits EXTREMITIES: No lower extremity edema Breast: Right breast cellulitis with firmness underneath the surface  LABORATORY DATA:  I have reviewed the data as listed   Chemistry      Component Value Date/Time   NA 140 12/12/2016 1500   K 3.8 12/12/2016 1500   CL 111 04/17/2007 0925   CO2 23 12/12/2016 1500   BUN 17.3 12/12/2016 1500   CREATININE 0.9 12/12/2016 1500      Component Value Date/Time   CALCIUM 9.3 12/12/2016 1500   ALKPHOS 81 12/12/2016 1500   AST 10 12/12/2016 1500   ALT 11 12/12/2016 1500   BILITOT 0.43 12/12/2016 1500       Lab Results  Component Value Date   WBC 9.5 01/23/2017   HGB 12.8 01/23/2017   HCT 38.3 01/23/2017   MCV 85.3 01/23/2017   PLT 199 01/23/2017   NEUTROABS 6.0 01/23/2017    ASSESSMENT & PLAN:  Breast cancer of upper-inner quadrant of right female breast ([place]) Right lumpectomy 03/01/2016: ILC, grade 2, 0.8 cm, ALH, additional superior margin : ILC grade to 0.3 cm , positive superior margin , 0/1 LN neg, mammoplasty bil : benign, ER 95%, PR 95%, HER-2 positive on primary tumor and negative on secondary mass which had a ratio of 1.55, gene copy number is 2.25, Ki-67 5%, T1bN0 stage IA  HER-2: HER-2 was  positive in the original tumor and the HER-2 on the second excision was negative. I was able to get this clarification from pathology.  Reexcision of superior margin: 5 2017: Benign  Treatment plan: 1. Adjuvant therapy with Taxol and Herceptin weekly 12 completed 07/06/2016 2. Followed by Herceptin maintenance every 3 weeks 1 year (was on hold 06/29/2016 to 08/08/2016 due to a decrease in ejection fraction) 2. Followedadjuvant radiation 08/10/2016 to 09/16/2016 3. Antiestrogen therapy With tamoxifen to start January 2018 (patient's last menstrual cycle was June 2017) ----------------------------------------------------------------------------------------------------------------------------- Current treatment: Adjuvant Herceptin every 3 weeks until the end of June 2017  Echocardiogram 08/02/2016: EF 55-60 % Herceptin toxicities: Patient does not have any side effects to Herceptin.  Tamoxifen Toxicities: Denies any hot flashes or myalgias. Initially she had loss of eyelashes and eyebrows when she was very sad about it.  Breast infection: Relapsed. I recommended retreating her with ciprofloxacin 500 mg twice a day for 7 days. I sent a message to Dr. Greig Right  regarding her episode of cellulitis.appa  Return to clinic every 3 weeks for Herceptin every 6 weeks for follow-up with me   I spent 25 minutes talking to the patient of which more than half was spent in counseling and coordination of care.  No orders of the defined types were placed in this encounter.  The patient has a good understanding of the overall plan. she agrees with it. she will call with any problems that may develop before the next visit here.   Rulon Eisenmenger, MD 01/23/17

## 2017-01-30 ENCOUNTER — Other Ambulatory Visit: Payer: Self-pay

## 2017-02-13 ENCOUNTER — Ambulatory Visit (HOSPITAL_BASED_OUTPATIENT_CLINIC_OR_DEPARTMENT_OTHER): Payer: Commercial Managed Care - PPO

## 2017-02-13 ENCOUNTER — Other Ambulatory Visit: Payer: Commercial Managed Care - PPO

## 2017-02-13 VITALS — BP 124/66 | HR 75 | Temp 98.7°F | Resp 17

## 2017-02-13 DIAGNOSIS — C50211 Malignant neoplasm of upper-inner quadrant of right female breast: Secondary | ICD-10-CM

## 2017-02-13 DIAGNOSIS — Z5112 Encounter for antineoplastic immunotherapy: Secondary | ICD-10-CM

## 2017-02-13 MED ORDER — SODIUM CHLORIDE 0.9 % IV SOLN
6.0000 mg/kg | Freq: Once | INTRAVENOUS | Status: AC
  Administered 2017-02-13: 651 mg via INTRAVENOUS
  Filled 2017-02-13: qty 31

## 2017-02-13 MED ORDER — ACETAMINOPHEN 325 MG PO TABS
650.0000 mg | ORAL_TABLET | Freq: Once | ORAL | Status: AC
  Administered 2017-02-13: 650 mg via ORAL

## 2017-02-13 MED ORDER — SODIUM CHLORIDE 0.9 % IV SOLN
Freq: Once | INTRAVENOUS | Status: AC
  Administered 2017-02-13: 16:00:00 via INTRAVENOUS

## 2017-02-13 MED ORDER — DIPHENHYDRAMINE HCL 25 MG PO CAPS
ORAL_CAPSULE | ORAL | Status: AC
  Filled 2017-02-13: qty 1

## 2017-02-13 MED ORDER — ACETAMINOPHEN 325 MG PO TABS
ORAL_TABLET | ORAL | Status: AC
  Filled 2017-02-13: qty 2

## 2017-02-13 MED ORDER — SODIUM CHLORIDE 0.9% FLUSH
10.0000 mL | INTRAVENOUS | Status: DC | PRN
  Administered 2017-02-13: 10 mL
  Filled 2017-02-13: qty 10

## 2017-02-13 MED ORDER — HEPARIN SOD (PORK) LOCK FLUSH 100 UNIT/ML IV SOLN
500.0000 [IU] | Freq: Once | INTRAVENOUS | Status: AC | PRN
  Administered 2017-02-13: 500 [IU]
  Filled 2017-02-13: qty 5

## 2017-02-13 MED ORDER — DIPHENHYDRAMINE HCL 25 MG PO CAPS
25.0000 mg | ORAL_CAPSULE | Freq: Once | ORAL | Status: AC
  Administered 2017-02-13: 25 mg via ORAL

## 2017-02-13 NOTE — Patient Instructions (Signed)
[facility] Discharge Instructions for Patients Receiving Chemotherapy  Today you received the following chemotherapy agents: Herceptin   To help prevent nausea and vomiting after your treatment, we encourage you to take your nausea medication as directed.    If you develop nausea and vomiting that is not controlled by your nausea medication, call the clinic.   BELOW ARE SYMPTOMS THAT SHOULD BE REPORTED IMMEDIATELY:  *FEVER GREATER THAN 100.5 F  *CHILLS WITH OR WITHOUT FEVER  NAUSEA AND VOMITING THAT IS NOT CONTROLLED WITH YOUR NAUSEA MEDICATION  *UNUSUAL SHORTNESS OF BREATH  *UNUSUAL BRUISING OR BLEEDING  TENDERNESS IN MOUTH AND THROAT WITH OR WITHOUT PRESENCE OF ULCERS  *URINARY PROBLEMS  *BOWEL PROBLEMS  UNUSUAL RASH Items with * indicate a potential emergency and should be followed up as soon as possible.  Feel free to call the clinic you have any questions or concerns. The clinic phone number is [phone].  Please show the CHEMO ALERT CARD at check-in to the Emergency Department and triage nurse.   

## 2017-03-06 ENCOUNTER — Other Ambulatory Visit (HOSPITAL_BASED_OUTPATIENT_CLINIC_OR_DEPARTMENT_OTHER): Payer: Commercial Managed Care - PPO

## 2017-03-06 ENCOUNTER — Ambulatory Visit (HOSPITAL_BASED_OUTPATIENT_CLINIC_OR_DEPARTMENT_OTHER): Payer: Commercial Managed Care - PPO | Admitting: Hematology and Oncology

## 2017-03-06 ENCOUNTER — Encounter: Payer: Self-pay | Admitting: Pharmacist

## 2017-03-06 ENCOUNTER — Ambulatory Visit (HOSPITAL_BASED_OUTPATIENT_CLINIC_OR_DEPARTMENT_OTHER): Payer: Commercial Managed Care - PPO

## 2017-03-06 ENCOUNTER — Encounter: Payer: Self-pay | Admitting: Hematology and Oncology

## 2017-03-06 DIAGNOSIS — Z170 Estrogen receptor positive status [ER+]: Secondary | ICD-10-CM | POA: Diagnosis not present

## 2017-03-06 DIAGNOSIS — C50211 Malignant neoplasm of upper-inner quadrant of right female breast: Secondary | ICD-10-CM

## 2017-03-06 DIAGNOSIS — Z5112 Encounter for antineoplastic immunotherapy: Secondary | ICD-10-CM

## 2017-03-06 DIAGNOSIS — Z79810 Long term (current) use of selective estrogen receptor modulators (SERMs): Secondary | ICD-10-CM | POA: Diagnosis not present

## 2017-03-06 LAB — CBC WITH DIFFERENTIAL/PLATELET
BASO%: 0.7 % (ref 0.0–2.0)
Basophils Absolute: 0.1 10*3/uL (ref 0.0–0.1)
EOS%: 4.9 % (ref 0.0–7.0)
Eosinophils Absolute: 0.4 10*3/uL (ref 0.0–0.5)
HCT: 36.9 % (ref 34.8–46.6)
HGB: 12.3 g/dL (ref 11.6–15.9)
LYMPH%: 27 % (ref 14.0–49.7)
MCH: 28.4 pg (ref 25.1–34.0)
MCHC: 33.4 g/dL (ref 31.5–36.0)
MCV: 84.8 fL (ref 79.5–101.0)
MONO#: 0.6 10*3/uL (ref 0.1–0.9)
MONO%: 7.1 % (ref 0.0–14.0)
NEUT#: 5.2 10*3/uL (ref 1.5–6.5)
NEUT%: 60.3 % (ref 38.4–76.8)
PLATELETS: 195 10*3/uL (ref 145–400)
RBC: 4.36 10*6/uL (ref 3.70–5.45)
RDW: 12.4 % (ref 11.2–14.5)
WBC: 8.6 10*3/uL (ref 3.9–10.3)
lymph#: 2.3 10*3/uL (ref 0.9–3.3)

## 2017-03-06 LAB — COMPREHENSIVE METABOLIC PANEL
ALT: 13 U/L (ref 0–55)
ANION GAP: 9 meq/L (ref 3–11)
AST: 15 U/L (ref 5–34)
Albumin: 3.5 g/dL (ref 3.5–5.0)
Alkaline Phosphatase: 69 U/L (ref 40–150)
BUN: 16.5 mg/dL (ref 7.0–26.0)
CHLORIDE: 108 meq/L (ref 98–109)
CO2: 25 meq/L (ref 22–29)
CREATININE: 0.8 mg/dL (ref 0.6–1.1)
Calcium: 8.9 mg/dL (ref 8.4–10.4)
EGFR: 81 mL/min/{1.73_m2} — AB (ref >90)
Glucose: 92 mg/dl (ref 70–140)
Potassium: 4 mEq/L (ref 3.5–5.1)
SODIUM: 142 meq/L (ref 136–145)
Total Bilirubin: 0.26 mg/dL (ref 0.20–1.20)
Total Protein: 7 g/dL (ref 6.4–8.3)

## 2017-03-06 MED ORDER — DIPHENHYDRAMINE HCL 25 MG PO CAPS
25.0000 mg | ORAL_CAPSULE | Freq: Once | ORAL | Status: AC
  Administered 2017-03-06: 25 mg via ORAL

## 2017-03-06 MED ORDER — SODIUM CHLORIDE 0.9% FLUSH
10.0000 mL | INTRAVENOUS | Status: DC | PRN
  Administered 2017-03-06: 10 mL
  Filled 2017-03-06: qty 10

## 2017-03-06 MED ORDER — ACETAMINOPHEN 325 MG PO TABS
650.0000 mg | ORAL_TABLET | Freq: Once | ORAL | Status: AC
  Administered 2017-03-06: 650 mg via ORAL

## 2017-03-06 MED ORDER — ACETAMINOPHEN 325 MG PO TABS
ORAL_TABLET | ORAL | Status: AC
  Filled 2017-03-06: qty 2

## 2017-03-06 MED ORDER — TRASTUZUMAB CHEMO 150 MG IV SOLR
6.0000 mg/kg | Freq: Once | INTRAVENOUS | Status: AC
  Administered 2017-03-06: 651 mg via INTRAVENOUS
  Filled 2017-03-06: qty 31

## 2017-03-06 MED ORDER — HEPARIN SOD (PORK) LOCK FLUSH 100 UNIT/ML IV SOLN
500.0000 [IU] | Freq: Once | INTRAVENOUS | Status: AC | PRN
  Administered 2017-03-06: 500 [IU]
  Filled 2017-03-06: qty 5

## 2017-03-06 MED ORDER — DIPHENHYDRAMINE HCL 25 MG PO CAPS
ORAL_CAPSULE | ORAL | Status: AC
  Filled 2017-03-06: qty 1

## 2017-03-06 MED ORDER — SODIUM CHLORIDE 0.9 % IV SOLN
Freq: Once | INTRAVENOUS | Status: AC
  Administered 2017-03-06: 16:00:00 via INTRAVENOUS

## 2017-03-06 NOTE — Progress Notes (Signed)
Consent documentation  Study code: [id number]  Met with patient and her sister in infusion on 03/06/17 at 4:30. Provided an overview of the "Pharmacogenetic analysis of toxicities related to administration of taxanes in breast cancer patients" study.  Consent form was reviewed with the patient (reviewed the study purpose, patient's role, possible side effects, cost, risk, information protection) All of the patient's questions were answered and she agreed to participate in the study. A signed copy of the consent form was given to the patient. All eligibility criteria have been met and patient has been enrolled in the study.  Study sample was collected via buccal swab after consent was obtained. Patient was informed that the sample would be sent to the lab for processing after samples were collected from 25 patients and that it would take approximately 2 weeks for the lab to process that sample.   Met with the patient for approximately 15 minutes.  Darl Pikes, PharmD, [place] Clinical Pharmacist- Oncology Pharmacy Resident

## 2017-03-06 NOTE — Patient Instructions (Signed)
[facility] Discharge Instructions for Patients Receiving Chemotherapy  Today you received the following chemotherapy agents herceptin   To help prevent nausea and vomiting after your treatment, we encourage you to take your nausea medication as directed   If you develop nausea and vomiting that is not controlled by your nausea medication, call the clinic.   BELOW ARE SYMPTOMS THAT SHOULD BE REPORTED IMMEDIATELY:  *FEVER GREATER THAN 100.5 F  *CHILLS WITH OR WITHOUT FEVER  NAUSEA AND VOMITING THAT IS NOT CONTROLLED WITH YOUR NAUSEA MEDICATION  *UNUSUAL SHORTNESS OF BREATH  *UNUSUAL BRUISING OR BLEEDING  TENDERNESS IN MOUTH AND THROAT WITH OR WITHOUT PRESENCE OF ULCERS  *URINARY PROBLEMS  *BOWEL PROBLEMS  UNUSUAL RASH Items with * indicate a potential emergency and should be followed up as soon as possible.  Feel free to call the clinic you have any questions or concerns. The clinic phone number is [phone].  

## 2017-03-06 NOTE — Assessment & Plan Note (Signed)
Right lumpectomy 03/01/2016: ILC, grade 2, 0.8 cm, ALH, additional superior margin : ILC grade to 0.3 cm , positive superior margin , 0/1 LN neg, mammoplasty bil : benign, ER 95%, PR 95%, HER-2 positive on primary tumor and negative on secondary mass which had a ratio of 1.55, gene copy number is 2.25, Ki-67 5%, T1bN0 stage IA  HER-2: HER-2 was positive in the original tumor and the HER-2 on the second excision was negative. I was able to get this clarification from pathology.  Reexcision of superior margin: 5 2017: Benign  Treatment plan: 1. Adjuvant therapy with Taxol and Herceptin weekly 12 completed 07/06/2016 2. Followed by Herceptin maintenance every 3 weeks 1 year (was on hold 06/29/2016 to 08/08/2016 due to a decrease in ejection fraction) 2. Followedadjuvant radiation 08/10/2016 to 09/16/2016 3. Antiestrogen therapy With tamoxifen to start January 2018 (patient's last menstrual cycle was June 2017) ----------------------------------------------------------------------------------------------------------------------------- Current treatment: Adjuvant Herceptin every 3 weeks until the end of June 2017  Echocardiogram 08/02/2016: EF 55-60 % Herceptin toxicities: Patient does not have any side effects to Herceptin.  Tamoxifen Toxicities: Denies any hot flashes or myalgias. Initially she had loss of eyelashes and eyebrows when she was very sad about it.  Breast infection: Improved Return to clinic every 3 weeks for Herceptin every 6 weeks for follow-up with me

## 2017-03-06 NOTE — Progress Notes (Signed)
Patient Care Team: [name], MD as PCP - General (Obstetrics and Gynecology)  DIAGNOSIS:  Encounter Diagnosis  Name Primary?  . Malignant neoplasm of upper-inner quadrant of right breast in female, estrogen receptor positive ([place])     SUMMARY OF ONCOLOGIC HISTORY:   Breast cancer of upper-inner quadrant of right female breast ([place])   01/21/2016 Initial Diagnosis    Right breast biopsy: Invasive lobular cancer, ER 95%, PR 95%, Ki-67 5%, HER-2 positive ratio 2.52      02/01/2016 Breast MRI    Right breast: Post biopsy hematoma measuring 1.9 x 1.3 x 2.5 cm surrounding ring of reactive enhancement, no lymphadenopathy. T2 N0 stage II a clinical stage      03/01/2016 Surgery    Right lumpectomy: ILC, grade 2, 0.8 cm, ALH, additional superior margin : ILC grade to 0.3 cm , positive superior margin , 0/1 LN neg, mammoplasty bil : benign, ER 95%, PR 95%, HER-2 pos, Ki-67 5%, T1bN0 stage IA      03/15/2016 Surgery    Right breast. Margin excision: Benign      04/20/2016 -  Chemotherapy    Taxol Herceptin weekly 12 followed by Herceptin maintenance for 1 year      08/10/2016 - 09/16/2016 Radiation Therapy    Adjuvant radiation therapy      10/26/2016 -  Anti-estrogen oral therapy    Tamoxifen 20 mg daily       CHIEF COMPLIANT: Follow-up of Herceptin and tamoxifen  INTERVAL HISTORY: Janet King is a 51 year old with above-mentioned history of right breast cancer underwent lumpectomy followed by adjuvant chemotherapy with Taxol Herceptin and then subsequently finish adjuvant radiation therapy. She is currently on Herceptin maintenance and tamoxifen therapy. She is tolerating Herceptin extremely well without any problems. She reports to me that because she feels hot in the evening she is starting to have difficulty with sleeping.  she has not had a good night sleep for long time because she is a very poor sleeper.  REVIEW OF SYSTEMS:   Constitutional: Denies fevers, chills or  abnormal weight loss Eyes: Denies blurriness of vision Ears, nose, mouth, throat, and face: Denies mucositis or sore throat Respiratory: Denies cough, dyspnea or wheezes Cardiovascular: Denies palpitation, chest discomfort Gastrointestinal:  Denies nausea, heartburn or change in bowel habits Skin: Denies abnormal skin rashes Lymphatics: Denies new lymphadenopathy or easy bruising Neurological:Denies numbness, tingling or new weaknesses Behavioral/Psych: Difficulty with sleeping  Extremities: No lower extremity edema Breast:  denies any pain or lumps or nodules in either breasts All other systems were reviewed with the patient and are negative.  I have reviewed the past medical history, past surgical history, social history and family history with the patient and they are unchanged from previous note.  ALLERGIES:  is allergic to penicillin g.  MEDICATIONS:  Current Outpatient Prescriptions  Medication Sig Dispense Refill  . carvedilol (COREG) 3.125 MG tablet Take 1 tablet (3.125 mg total) by mouth 2 (two) times daily. 180 tablet 3  . Cholecalciferol (VITAMIN D3) 50000 units TABS Take 100,000 Units by mouth once a week.    . ciprofloxacin (CIPRO) 500 MG tablet Take 1 tablet (500 mg total) by mouth 2 (two) times daily. 14 tablet 1  . lidocaine-prilocaine (EMLA) cream APPLY TOPICALLY TO THE AFFECTED AREA ONCE DAILY  3  . losartan (COZAAR) 25 MG tablet Take 1 tablet (25 mg total) by mouth daily. 30 tablet 6  . SYNTHROID 112 MCG tablet   0  . tamoxifen (NOLVADEX)  20 MG tablet Take 1 tablet (20 mg total) by mouth daily. 90 tablet 3   No current facility-administered medications for this visit.    Facility-Administered Medications Ordered in Other Visits  Medication Dose Route Frequency Provider Last Rate Last Dose  . heparin lock flush 100 unit/mL  500 Units Intracatheter Once PRN Nicholas Lose, MD      . sodium chloride flush (NS) 0.9 % injection 10 mL  10 mL Intracatheter PRN Nicholas Lose, MD      . trastuzumab (HERCEPTIN) 651 mg in sodium chloride 0.9 % 250 mL chemo infusion  6 mg/kg (Treatment Plan Recorded) Intravenous Once Nicholas Lose, MD 562 mL/hr at 03/06/17 1637 651 mg at 03/06/17 1637    PHYSICAL EXAMINATION: ECOG PERFORMANCE STATUS: 1 - Symptomatic but completely ambulatory  Vitals:   03/06/17 1559  BP: 125/78  Pulse: 80  Resp: 18  Temp: 97.9 F (36.6 C)   Filed Weights   03/06/17 1559  Weight: 247 lb (112 kg)    GENERAL:alert, no distress and comfortable SKIN: skin color, texture, turgor are normal, no rashes or significant lesions EYES: normal, Conjunctiva are pink and non-injected, sclera clear OROPHARYNX:no exudate, no erythema and lips, buccal mucosa, and tongue normal  NECK: supple, thyroid normal size, non-tender, without nodularity LYMPH:  no palpable lymphadenopathy in the cervical, axillary or inguinal LUNGS: clear to auscultation and percussion with normal breathing effort HEART: regular rate & rhythm and no murmurs and no lower extremity edema ABDOMEN:abdomen soft, non-tender and normal bowel sounds MUSCULOSKELETAL:no cyanosis of digits and no clubbing  NEURO: alert & oriented x 3 with fluent speech, no focal motor/sensory deficits EXTREMITIES: No lower extremity edema  LABORATORY DATA:  I have reviewed the data as listed   Chemistry      Component Value Date/Time   NA 142 03/06/2017 1500   K 4.0 03/06/2017 1500   CL 111 04/17/2007 0925   CO2 25 03/06/2017 1500   BUN 16.5 03/06/2017 1500   CREATININE 0.8 03/06/2017 1500      Component Value Date/Time   CALCIUM 8.9 03/06/2017 1500   ALKPHOS 69 03/06/2017 1500   AST 15 03/06/2017 1500   ALT 13 03/06/2017 1500   BILITOT 0.26 03/06/2017 1500       Lab Results  Component Value Date   WBC 8.6 03/06/2017   HGB 12.3 03/06/2017   HCT 36.9 03/06/2017   MCV 84.8 03/06/2017   PLT 195 03/06/2017   NEUTROABS 5.2 03/06/2017    ASSESSMENT & PLAN:  Breast cancer of  upper-inner quadrant of right female breast ([place]) Right lumpectomy 03/01/2016: ILC, grade 2, 0.8 cm, ALH, additional superior margin : ILC grade to 0.3 cm , positive superior margin , 0/1 LN neg, mammoplasty bil : benign, ER 95%, PR 95%, HER-2 positive on primary tumor and negative on secondary mass which had a ratio of 1.55, gene copy number is 2.25, Ki-67 5%, T1bN0 stage IA  HER-2: HER-2 was positive in the original tumor and the HER-2 on the second excision was negative. I was able to get this clarification from pathology.  Reexcision of superior margin: 5 2017: Benign  Treatment plan: 1. Adjuvant therapy with Taxol and Herceptin weekly 12 completed 07/06/2016 2. Followed by Herceptin maintenance every 3 weeks 1 year (was on hold 06/29/2016 to 08/08/2016 due to a decrease in ejection fraction) 2. Followedadjuvant radiation 08/10/2016 to 09/16/2016 3. Antiestrogen therapy With tamoxifen to start January 2018 (patient's last menstrual cycle was June 2017) ----------------------------------------------------------------------------------------------------------------------------- Current  treatment: Adjuvant Herceptin every 3 weeks until the end of June 2017  Echocardiogram 08/02/2016: EF 55-60 % Herceptin toxicities: Patient does not have any side effects to Herceptin.  Tamoxifen Toxicities: Mild hot flashes which keep her awake at night  Breast infection: Improved Return to clinic every 3 weeks for Herceptin every 6 weeks for follow-up with me  I spent 25 minutes talking to the patient of which more than half was spent in counseling and coordination of care.  No orders of the defined types were placed in this encounter.  The patient has a good understanding of the overall plan. she agrees with it. she will call with any problems that may develop before the next visit here.   Rulon Eisenmenger, MD 03/06/17

## 2017-03-07 ENCOUNTER — Other Ambulatory Visit: Payer: Self-pay | Admitting: General Surgery

## 2017-03-07 DIAGNOSIS — N611 Abscess of the breast and nipple: Secondary | ICD-10-CM

## 2017-03-07 DIAGNOSIS — N644 Mastodynia: Secondary | ICD-10-CM

## 2017-03-07 DIAGNOSIS — N63 Unspecified lump in unspecified breast: Secondary | ICD-10-CM

## 2017-03-16 ENCOUNTER — Other Ambulatory Visit (HOSPITAL_COMMUNITY): Payer: Self-pay | Admitting: Internal Medicine

## 2017-03-19 ENCOUNTER — Other Ambulatory Visit: Payer: Self-pay | Admitting: Plastic Surgery

## 2017-03-19 DIAGNOSIS — Z853 Personal history of malignant neoplasm of breast: Secondary | ICD-10-CM

## 2017-03-20 ENCOUNTER — Ambulatory Visit
Admission: RE | Admit: 2017-03-20 | Discharge: 2017-03-20 | Disposition: A | Discharge status: Home / Self Care | Payer: Commercial Managed Care - PPO | Source: Ambulatory Visit | Attending: Hematology and Oncology | Admitting: Hematology and Oncology

## 2017-03-20 ENCOUNTER — Other Ambulatory Visit: Payer: Self-pay | Admitting: Hematology and Oncology

## 2017-03-20 ENCOUNTER — Other Ambulatory Visit: Payer: Commercial Managed Care - PPO

## 2017-03-20 ENCOUNTER — Telehealth: Payer: Self-pay

## 2017-03-20 ENCOUNTER — Ambulatory Visit
Admission: RE | Admit: 2017-03-20 | Discharge: 2017-03-20 | Disposition: A | Discharge status: Home / Self Care | Payer: Commercial Managed Care - PPO | Source: Ambulatory Visit | Attending: General Surgery | Admitting: General Surgery

## 2017-03-20 DIAGNOSIS — Z170 Estrogen receptor positive status [ER+]: Principal | ICD-10-CM

## 2017-03-20 DIAGNOSIS — N611 Abscess of the breast and nipple: Secondary | ICD-10-CM

## 2017-03-20 DIAGNOSIS — N63 Unspecified lump in unspecified breast: Secondary | ICD-10-CM

## 2017-03-20 DIAGNOSIS — C50211 Malignant neoplasm of upper-inner quadrant of right female breast: Secondary | ICD-10-CM

## 2017-03-20 DIAGNOSIS — N644 Mastodynia: Secondary | ICD-10-CM

## 2017-03-20 DIAGNOSIS — Z853 Personal history of malignant neoplasm of breast: Secondary | ICD-10-CM

## 2017-03-20 NOTE — Telephone Encounter (Signed)
error 

## 2017-03-27 ENCOUNTER — Ambulatory Visit (HOSPITAL_BASED_OUTPATIENT_CLINIC_OR_DEPARTMENT_OTHER): Payer: Commercial Managed Care - PPO

## 2017-03-27 ENCOUNTER — Ambulatory Visit: Payer: Commercial Managed Care - PPO | Admitting: Hematology and Oncology

## 2017-03-27 ENCOUNTER — Other Ambulatory Visit: Payer: Commercial Managed Care - PPO

## 2017-03-27 VITALS — BP 122/74 | HR 83 | Temp 98.3°F | Resp 18

## 2017-03-27 DIAGNOSIS — Z5112 Encounter for antineoplastic immunotherapy: Secondary | ICD-10-CM

## 2017-03-27 DIAGNOSIS — C50211 Malignant neoplasm of upper-inner quadrant of right female breast: Secondary | ICD-10-CM | POA: Diagnosis not present

## 2017-03-27 MED ORDER — SODIUM CHLORIDE 0.9 % IV SOLN
Freq: Once | INTRAVENOUS | Status: AC
  Administered 2017-03-27: 16:00:00 via INTRAVENOUS

## 2017-03-27 MED ORDER — HEPARIN SOD (PORK) LOCK FLUSH 100 UNIT/ML IV SOLN
500.0000 [IU] | Freq: Once | INTRAVENOUS | Status: AC | PRN
  Administered 2017-03-27: 500 [IU]
  Filled 2017-03-27: qty 5

## 2017-03-27 MED ORDER — DIPHENHYDRAMINE HCL 25 MG PO CAPS
ORAL_CAPSULE | ORAL | Status: AC
  Filled 2017-03-27: qty 1

## 2017-03-27 MED ORDER — DIPHENHYDRAMINE HCL 25 MG PO CAPS
25.0000 mg | ORAL_CAPSULE | Freq: Once | ORAL | Status: AC
  Administered 2017-03-27: 25 mg via ORAL

## 2017-03-27 MED ORDER — ACETAMINOPHEN 325 MG PO TABS
650.0000 mg | ORAL_TABLET | Freq: Once | ORAL | Status: AC
  Administered 2017-03-27: 650 mg via ORAL

## 2017-03-27 MED ORDER — TRASTUZUMAB CHEMO 150 MG IV SOLR
6.0000 mg/kg | Freq: Once | INTRAVENOUS | Status: AC
  Administered 2017-03-27: 651 mg via INTRAVENOUS
  Filled 2017-03-27: qty 31

## 2017-03-27 MED ORDER — SODIUM CHLORIDE 0.9% FLUSH
10.0000 mL | INTRAVENOUS | Status: DC | PRN
  Administered 2017-03-27: 10 mL
  Filled 2017-03-27: qty 10

## 2017-03-27 MED ORDER — ACETAMINOPHEN 325 MG PO TABS
ORAL_TABLET | ORAL | Status: AC
  Filled 2017-03-27: qty 2

## 2017-03-27 NOTE — Patient Instructions (Signed)
[place] Discharge Instructions for Patients Receiving Chemotherapy  Today you received the following chemotherapy agents trastuzumab (Herceptin).  To help prevent nausea and vomiting after your treatment, we encourage you to take your nausea medication as directed  If you develop nausea and vomiting that is not controlled by your nausea medication, call the clinic.   BELOW ARE SYMPTOMS THAT SHOULD BE REPORTED IMMEDIATELY:  *FEVER GREATER THAN 100.5 F  *CHILLS WITH OR WITHOUT FEVER  NAUSEA AND VOMITING THAT IS NOT CONTROLLED WITH YOUR NAUSEA MEDICATION  *UNUSUAL SHORTNESS OF BREATH  *UNUSUAL BRUISING OR BLEEDING  TENDERNESS IN MOUTH AND THROAT WITH OR WITHOUT PRESENCE OF ULCERS  *URINARY PROBLEMS  *BOWEL PROBLEMS  UNUSUAL RASH Items with * indicate a potential emergency and should be followed up as soon as possible.  Feel free to call the clinic you have any questions or concerns. The clinic phone number is [phone].

## 2017-04-03 ENCOUNTER — Ambulatory Visit
Admission: RE | Admit: 2017-04-03 | Discharge: 2017-04-03 | Disposition: A | Discharge status: Home / Self Care | Payer: Commercial Managed Care - PPO | Source: Ambulatory Visit | Attending: Hematology and Oncology | Admitting: Hematology and Oncology

## 2017-04-03 DIAGNOSIS — Z853 Personal history of malignant neoplasm of breast: Secondary | ICD-10-CM

## 2017-04-06 ENCOUNTER — Telehealth: Payer: Self-pay | Admitting: Oncology

## 2017-04-06 NOTE — Telephone Encounter (Signed)
Called patient back and advised her that she does not need to follow up with Dr. Sondra Come unless she is having any issues.  Janet King verbalized agreement and understanding.

## 2017-04-06 NOTE — Telephone Encounter (Signed)
Patient called and said she is not sure if she needs to make a 6 month follow up with Dr. Sondra Come.  She said she is still following up with Medical Oncology, Plastic Surgery and General Surgery.

## 2017-04-17 ENCOUNTER — Ambulatory Visit (HOSPITAL_BASED_OUTPATIENT_CLINIC_OR_DEPARTMENT_OTHER): Payer: Commercial Managed Care - PPO | Admitting: Adult Health

## 2017-04-17 ENCOUNTER — Encounter: Payer: Self-pay | Admitting: *Deleted

## 2017-04-17 ENCOUNTER — Ambulatory Visit (HOSPITAL_BASED_OUTPATIENT_CLINIC_OR_DEPARTMENT_OTHER): Payer: Commercial Managed Care - PPO

## 2017-04-17 ENCOUNTER — Encounter: Payer: Self-pay | Admitting: Adult Health

## 2017-04-17 ENCOUNTER — Other Ambulatory Visit (HOSPITAL_BASED_OUTPATIENT_CLINIC_OR_DEPARTMENT_OTHER): Payer: Commercial Managed Care - PPO

## 2017-04-17 VITALS — BP 126/72 | HR 89 | Temp 97.8°F | Resp 20 | Ht 66.0 in | Wt 247.4 lb

## 2017-04-17 DIAGNOSIS — Z79810 Long term (current) use of selective estrogen receptor modulators (SERMs): Secondary | ICD-10-CM

## 2017-04-17 DIAGNOSIS — Z170 Estrogen receptor positive status [ER+]: Secondary | ICD-10-CM | POA: Diagnosis not present

## 2017-04-17 DIAGNOSIS — C50211 Malignant neoplasm of upper-inner quadrant of right female breast: Secondary | ICD-10-CM

## 2017-04-17 DIAGNOSIS — Z5112 Encounter for antineoplastic immunotherapy: Secondary | ICD-10-CM | POA: Diagnosis not present

## 2017-04-17 LAB — CBC WITH DIFFERENTIAL/PLATELET
BASO%: 0.6 % (ref 0.0–2.0)
Basophils Absolute: 0.1 10*3/uL (ref 0.0–0.1)
EOS%: 4.4 % (ref 0.0–7.0)
Eosinophils Absolute: 0.4 10*3/uL (ref 0.0–0.5)
HEMATOCRIT: 36.1 % (ref 34.8–46.6)
HGB: 12.1 g/dL (ref 11.6–15.9)
LYMPH#: 2.6 10*3/uL (ref 0.9–3.3)
LYMPH%: 29.4 % (ref 14.0–49.7)
MCH: 29.1 pg (ref 25.1–34.0)
MCHC: 33.5 g/dL (ref 31.5–36.0)
MCV: 86.8 fL (ref 79.5–101.0)
MONO#: 0.5 10*3/uL (ref 0.1–0.9)
MONO%: 5.4 % (ref 0.0–14.0)
NEUT#: 5.4 10*3/uL (ref 1.5–6.5)
NEUT%: 60.2 % (ref 38.4–76.8)
Platelets: 174 10*3/uL (ref 145–400)
RBC: 4.16 10*6/uL (ref 3.70–5.45)
RDW: 12.8 % (ref 11.2–14.5)
WBC: 8.9 10*3/uL (ref 3.9–10.3)

## 2017-04-17 LAB — COMPREHENSIVE METABOLIC PANEL
ALT: 15 U/L (ref 0–55)
AST: 13 U/L (ref 5–34)
Albumin: 3.4 g/dL — ABNORMAL LOW (ref 3.5–5.0)
Alkaline Phosphatase: 80 U/L (ref 40–150)
Anion Gap: 10 mEq/L (ref 3–11)
BUN: 13.7 mg/dL (ref 7.0–26.0)
CHLORIDE: 108 meq/L (ref 98–109)
CO2: 25 meq/L (ref 22–29)
CREATININE: 0.9 mg/dL (ref 0.6–1.1)
Calcium: 9 mg/dL (ref 8.4–10.4)
EGFR: 71 mL/min/{1.73_m2} — ABNORMAL LOW (ref >90)
Glucose: 138 mg/dl (ref 70–140)
Potassium: 4 mEq/L (ref 3.5–5.1)
Sodium: 143 mEq/L (ref 136–145)
Total Bilirubin: 0.48 mg/dL (ref 0.20–1.20)
Total Protein: 7.1 g/dL (ref 6.4–8.3)

## 2017-04-17 MED ORDER — SODIUM CHLORIDE 0.9 % IV SOLN
Freq: Once | INTRAVENOUS | Status: AC
  Administered 2017-04-17: 16:00:00 via INTRAVENOUS

## 2017-04-17 MED ORDER — DIPHENHYDRAMINE HCL 25 MG PO CAPS
25.0000 mg | ORAL_CAPSULE | Freq: Once | ORAL | Status: AC
  Administered 2017-04-17: 25 mg via ORAL

## 2017-04-17 MED ORDER — TRASTUZUMAB CHEMO 150 MG IV SOLR
6.0000 mg/kg | Freq: Once | INTRAVENOUS | Status: AC
  Administered 2017-04-17: 651 mg via INTRAVENOUS
  Filled 2017-04-17: qty 31

## 2017-04-17 MED ORDER — DIPHENHYDRAMINE HCL 25 MG PO CAPS
ORAL_CAPSULE | ORAL | Status: AC
  Filled 2017-04-17: qty 1

## 2017-04-17 MED ORDER — ACETAMINOPHEN 325 MG PO TABS
ORAL_TABLET | ORAL | Status: AC
  Filled 2017-04-17: qty 2

## 2017-04-17 MED ORDER — SODIUM CHLORIDE 0.9% FLUSH
10.0000 mL | INTRAVENOUS | Status: DC | PRN
  Administered 2017-04-17: 10 mL
  Filled 2017-04-17: qty 10

## 2017-04-17 MED ORDER — ACETAMINOPHEN 325 MG PO TABS
650.0000 mg | ORAL_TABLET | Freq: Once | ORAL | Status: AC
  Administered 2017-04-17: 650 mg via ORAL

## 2017-04-17 MED ORDER — HEPARIN SOD (PORK) LOCK FLUSH 100 UNIT/ML IV SOLN
500.0000 [IU] | Freq: Once | INTRAVENOUS | Status: AC | PRN
  Administered 2017-04-17: 500 [IU]
  Filled 2017-04-17: qty 5

## 2017-04-17 NOTE — Progress Notes (Signed)
[facility] Follow up:    Janet King, [place], [place] [address]   DIAGNOSIS: Cancer Staging No matching staging information was found for the patient.  SUMMARY OF ONCOLOGIC HISTORY:   Breast cancer of upper-inner quadrant of right female breast ([place])   01/21/2016 Initial Diagnosis    Right breast biopsy: Invasive lobular cancer, ER 95%, PR 95%, Ki-67 5%, HER-2 positive ratio 2.52      02/01/2016 Breast MRI    Right breast: Post biopsy hematoma measuring 1.9 x 1.3 x 2.5 cm surrounding ring of reactive enhancement, no lymphadenopathy. T2 N0 stage II a clinical stage      03/01/2016 Surgery    Right lumpectomy: ILC, grade 2, 0.8 cm, ALH, additional superior margin : ILC grade to 0.3 cm , positive superior margin , 0/1 LN neg, mammoplasty bil : benign, ER 95%, PR 95%, HER-2 pos, Ki-67 5%, T1bN0 stage IA      03/15/2016 Surgery    Right breast. Margin excision: Benign      04/20/2016 -  Chemotherapy    Taxol Herceptin weekly 12 followed by Herceptin maintenance for 1 year      08/10/2016 - 09/16/2016 Radiation Therapy    Adjuvant radiation therapy      11/14/2016 -  Anti-estrogen oral therapy    Tamoxifen 20 mg daily       CURRENT THERAPY: Herceptin and Tamoxifen  INTERVAL HISTORY: Janet King 51 y.o. female returns for evaluation prior tot receiving herceptin.  She is doing moderately well today.  She has undergone routine echocardiograms and they have remained stable.  Last appt on 01/16/17 and next appt on 05/01/17.  No new cardiac issues.  She is taking Tamoxifen and managing side effects.  She denies any new cardiac issues such as DOE, orthopnea, chest pain, palpitations, swelling.    Patient Active Problem List   Diagnosis Date Noted  . 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 Genetic testing 03/28/2016  . Family history of breast cancer in mother 02/03/2016  . Family history of colon cancer  02/03/2016  . Breast cancer of upper-inner quadrant of right female breast ([place]) 02/02/2016    is allergic to penicillin g.  MEDICAL HISTORY: Past Medical History:  Diagnosis Date  . Baker's cyst of knee   . Breast cancer ([place])    right  . H/O bone density study   . History of radiation therapy 08/10/16-09/16/16   right breast 50.4 Gy  . Hypothyroidism     SURGICAL HISTORY: Past Surgical History:  Procedure Laterality Date  . BREAST LUMPECTOMY     invasive carcinoma right april 2017  . BREAST RECONSTRUCTION Bilateral 03/01/2016   Procedure: RIGHT ONCOPLASTIC BREAST RECONSTRUCTION WITH LEFT REDUCTION FOR SYMMETRY;  Surgeon: Irene Limbo, MD;  Location: [facility];  Service: Plastics;  Laterality: Bilateral;  . PERIPHERAL VASCULAR CATHETERIZATION Right 03/01/2016   Procedure: PORTA CATH INSERTION;  Surgeon: Rolm Bookbinder, MD;  Location: [facility];  Service: General;  Laterality: Right;  . RADIOACTIVE SEED GUIDED MASTECTOMY WITH AXILLARY SENTINEL LYMPH NODE BIOPSY Right 03/01/2016   Procedure: RADIOACTIVE SEED GUIDED PARTIAL MASTECTOMY WITH AXILLARY SENTINEL LYMPH NODE BIOPSY;  Surgeon: Rolm Bookbinder, MD;  Location: [facility];  Service: General;  Laterality: Right;  . RE-EXCISION OF BREAST CANCER,SUPERIOR MARGINS Right 03/15/2016   Procedure: RE-EXCISION OF BREAST CANCER, SUPERIOR MARGINS;  Surgeon: Rolm Bookbinder, MD;  Location: [facility];  Service: General;  Laterality: Right;  . REDUCTION MAMMAPLASTY     bilateral 2017  . TONSILLECTOMY      SOCIAL HISTORY: Social History   Social History  . Marital status: Single    Spouse name: N/A  . Number of children: 0  . Years of education: N/A   Occupational History  . 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 Smoking status: Never Smoker  . Smokeless tobacco: Never Used  . Alcohol use 0.0 oz/week     Comment: occ - once every 3  mos  . Drug use: No  . Sexual activity: Not Currently   Other Topics Concern  . Not on file   Social History Narrative  . No narrative on file    FAMILY HISTORY: Family History  Problem Relation Age of Onset  . Breast cancer Mother 59  . Heart disease Maternal Grandfather   . Colon cancer Paternal Grandmother        dx. 94s  . Cancer Other        maternal great aunt (MGM's sister) dx. with NOS cancer at older age  . Breast cancer Other        maternal great aunt (MGF's sister) dx. in her 74s; s/p mastectomy  . Cancer Other        paternal great aunt (PGM's sister) dx. with NOS cancer at older age    Review of Systems  Constitutional: Negative for appetite change, chills, diaphoresis, fatigue, fever and unexpected weight change.  HENT:   Negative for hearing loss and lump/mass.   Eyes: Negative for eye problems and icterus.  Respiratory: Negative for chest tightness, cough and shortness of breath.   Cardiovascular: Negative for chest pain, leg swelling and palpitations.  Gastrointestinal: Negative for abdominal distention and abdominal pain.  Endocrine: Positive for hot flashes.  Genitourinary: Negative for difficulty urinating.   Musculoskeletal: Positive for arthralgias.  Skin: Negative for itching and rash.  Neurological: Negative for dizziness, extremity weakness and headaches.  Hematological: Negative for adenopathy. Does not bruise/bleed easily.  Psychiatric/Behavioral: Negative for depression. The patient is not nervous/anxious.       PHYSICAL EXAMINATION  ECOG PERFORMANCE STATUS: 1 - Symptomatic but completely ambulatory  Vitals:   04/17/17 1511  BP: 126/72  Pulse: 89  Resp: 20  Temp: 97.8 F (36.6 C)    Physical Exam  Constitutional: She is oriented to person, place, and time and well-developed, well-nourished, and in no distress.  HENT:  Head: Normocephalic and atraumatic.  Mouth/Throat: Oropharynx is clear and moist. No oropharyngeal exudate.   Eyes: Pupils are equal, round, and reactive to light. No scleral icterus.  Neck: Neck supple.  Cardiovascular: Normal rate, regular rhythm and normal heart sounds.  Exam reveals no gallop and no friction rub.   No murmur heard. Pulmonary/Chest: Effort normal and breath sounds normal. No respiratory distress. She has no wheezes. She has no rales.  Right breast s/p mammoplasty and lumpectomy.  Skin is erythematous, tender, and thick (from previous radiation), patient states this is improving.  Left breast s/p mammoplasty.    No nodules, masses, or any concerns for recurrence in either breast.    Abdominal: Soft. Bowel sounds are normal. She exhibits no distension and no mass. There is no tenderness. There is no rebound and no guarding.  Musculoskeletal: She exhibits no edema.  Lymphadenopathy:    She has no cervical adenopathy.  Neurological: She is alert and oriented to person, place, and time.  Skin: Skin  is warm and dry. No rash noted.  Psychiatric: Mood and affect normal.    LABORATORY DATA:  CBC    Component Value Date/Time   WBC 8.9 04/17/2017 1501   RBC 4.16 04/17/2017 1501   HGB 12.1 04/17/2017 1501   HCT 36.1 04/17/2017 1501   PLT 174 04/17/2017 1501   MCV 86.8 04/17/2017 1501   MCH 29.1 04/17/2017 1501   MCHC 33.5 04/17/2017 1501   RDW 12.8 04/17/2017 1501   LYMPHSABS 2.6 04/17/2017 1501   MONOABS 0.5 04/17/2017 1501   EOSABS 0.4 04/17/2017 1501   BASOSABS 0.1 04/17/2017 1501    CMP     Component Value Date/Time   NA 143 04/17/2017 1501   K 4.0 04/17/2017 1501   CL 111 04/17/2007 0925   CO2 25 04/17/2017 1501   GLUCOSE 138 04/17/2017 1501   BUN 13.7 04/17/2017 1501   CREATININE 0.9 04/17/2017 1501   CALCIUM 9.0 04/17/2017 1501   PROT 7.1 04/17/2017 1501   ALBUMIN 3.4 (L) 04/17/2017 1501   AST 13 04/17/2017 1501   ALT 15 04/17/2017 1501   ALKPHOS 80 04/17/2017 1501   BILITOT 0.48 04/17/2017 1501   GFRNONAA 84 04/17/2007 0925   GFRAA 102 04/17/2007 0925     ASSESSMENT and PLAN:   Breast cancer of upper-inner quadrant of right female breast ([facility]) Right lumpectomy 03/01/2016: ILC, grade 2, 0.8 cm, ALH, additional superior margin : ILC grade to 0.3 cm , positive superior margin , 0/1 LN neg, mammoplasty bil : benign, ER 95%, PR 95%, HER-2 positive on primary tumor and negative on secondary mass which had a ratio of 1.55, gene copy number is 2.25, Ki-67 5%, T1bN0 stage IA  HER-2: HER-2 was positive in the original tumor and the HER-2 on the second excision was negative. I was able to get this clarification from pathology.  Reexcision of superior margin: 5 2017: Benign  Treatment plan: 1. Adjuvant therapy with Taxol and Herceptin weekly 12 completed 07/06/2016 2. Followed by Herceptin maintenance every 3 weeks 1 year (was on hold 06/29/2016 to 08/08/2016 due to a decrease in ejection fraction) 2. Followedadjuvant radiation 08/10/2016 to 09/16/2016 3. Antiestrogen therapy With tamoxifen starting January 2018 (patient's last menstrual cycle was June 2017) ----------------------------------------------------------------------------------------------------------------------------- Current treatment: Adjuvant Herceptin every 3 weeks  Echocardiogram 01/16/2017: EF 55-60 % Herceptin toxicities: None at this time.  Will f/u with cardio onc in a few weeks.   OK for port removal.    Tamoxifen Toxicities: Managing well.  Will continue.  Patient will follow up in [facility] for a [place] in a couple of months.  I reviewed with her what that appointment entails.  I will have her follow up schedule for her, Dr. Lindi Adie, and Dr. Donne Hazel at that time.  She expressed appreciation about this and is very happy to get these details and go through her care plan.     All questions were answered. The patient knows to call the clinic with any problems, questions or concerns. We can certainly see the patient much sooner if necessary.  A  total of (30) minutes of face-to-face time was spent with this patient with greater than 50% of that time in counseling and care-coordination.  This note was electronically signed.  Scot Dock, NP 04/17/2017

## 2017-04-17 NOTE — Assessment & Plan Note (Signed)
Right lumpectomy 03/01/2016: ILC, grade 2, 0.8 cm, ALH, additional superior margin : ILC grade to 0.3 cm , positive superior margin , 0/1 LN neg, mammoplasty bil : benign, ER 95%, PR 95%, HER-2 positive on primary tumor and negative on secondary mass which had a ratio of 1.55, gene copy number is 2.25, Ki-67 5%, T1bN0 stage IA  HER-2: HER-2 was positive in the original tumor and the HER-2 on the second excision was negative. I was able to get this clarification from pathology.  Reexcision of superior margin: 5 2017: Benign  Treatment plan: 1. Adjuvant therapy with Taxol and Herceptin weekly 12 completed 07/06/2016 2. Followed by Herceptin maintenance every 3 weeks 1 year (was on hold 06/29/2016 to 08/08/2016 due to a decrease in ejection fraction) 2. Followedadjuvant radiation 08/10/2016 to 09/16/2016 3. Antiestrogen therapy With tamoxifen starting January 2018 (patient's last menstrual cycle was June 2017) ----------------------------------------------------------------------------------------------------------------------------- Current treatment: Adjuvant Herceptin every 3 weeks  Echocardiogram 01/16/2017: EF 55-60 % Herceptin toxicities: None at this time.  Will f/u with cardio onc in a few weeks.   OK for port removal.    Tamoxifen Toxicities: Managing well.  Will continue.  Patient will follow up in [facility] for a [place] in a couple of months.  I reviewed with her what that appointment entails.  I will have her follow up schedule for her, Dr. Lindi Adie, and Dr. Donne Hazel at that time.  She expressed appreciation about this and is very happy to get these details and go through her care plan.

## 2017-04-17 NOTE — Patient Instructions (Signed)
[facility] Discharge Instructions for Patients Receiving Chemotherapy  Today you received the following chemotherapy agents:  Herceptin  To help prevent nausea and vomiting after your treatment, we encourage you to take your nausea medication as prescribed.   If you develop nausea and vomiting that is not controlled by your nausea medication, call the clinic.   BELOW ARE SYMPTOMS THAT SHOULD BE REPORTED IMMEDIATELY:  *FEVER GREATER THAN 100.5 F  *CHILLS WITH OR WITHOUT FEVER  NAUSEA AND VOMITING THAT IS NOT CONTROLLED WITH YOUR NAUSEA MEDICATION  *UNUSUAL SHORTNESS OF BREATH  *UNUSUAL BRUISING OR BLEEDING  TENDERNESS IN MOUTH AND THROAT WITH OR WITHOUT PRESENCE OF ULCERS  *URINARY PROBLEMS  *BOWEL PROBLEMS  UNUSUAL RASH Items with * indicate a potential emergency and should be followed up as soon as possible.  Feel free to call the clinic you have any questions or concerns. The clinic phone number is [phone].  Please show the CHEMO ALERT CARD at check-in to the Emergency Department and triage nurse.   

## 2017-04-20 ENCOUNTER — Ambulatory Visit: Payer: Self-pay | Admitting: Radiation Oncology

## 2017-05-01 ENCOUNTER — Ambulatory Visit (HOSPITAL_COMMUNITY)
Admission: RE | Admit: 2017-05-01 | Discharge: 2017-05-01 | Disposition: A | Discharge status: Home / Self Care | Payer: Commercial Managed Care - PPO | Source: Ambulatory Visit | Attending: Internal Medicine | Admitting: Internal Medicine

## 2017-05-01 ENCOUNTER — Ambulatory Visit (HOSPITAL_BASED_OUTPATIENT_CLINIC_OR_DEPARTMENT_OTHER)
Admission: RE | Admit: 2017-05-01 | Discharge: 2017-05-01 | Disposition: A | Discharge status: Home / Self Care | Payer: Commercial Managed Care - PPO | Source: Ambulatory Visit | Attending: Cardiology | Admitting: Cardiology

## 2017-05-01 ENCOUNTER — Encounter (HOSPITAL_COMMUNITY): Payer: Self-pay

## 2017-05-01 VITALS — BP 125/82 | HR 83 | Wt 251.0 lb

## 2017-05-01 DIAGNOSIS — Z09 Encounter for follow-up examination after completed treatment for conditions other than malignant neoplasm: Secondary | ICD-10-CM | POA: Insufficient documentation

## 2017-05-01 DIAGNOSIS — T451X5A Adverse effect of antineoplastic and immunosuppressive drugs, initial encounter: Secondary | ICD-10-CM | POA: Diagnosis not present

## 2017-05-01 DIAGNOSIS — I427 Cardiomyopathy due to drug and external agent: Secondary | ICD-10-CM

## 2017-05-01 DIAGNOSIS — C50211 Malignant neoplasm of upper-inner quadrant of right female breast: Secondary | ICD-10-CM | POA: Diagnosis not present

## 2017-05-01 LAB — ECHOCARDIOGRAM COMPLETE
AVLVOTPG: 5 mmHg
CHL CUP DOP CALC LVOT VTI: 24 cm
E decel time: 197 msec
E/e' ratio: 7.98
FS: 28 % (ref 28–44)
IVS/LV PW RATIO, ED: 0.87
LA ID, A-P, ES: 37 mm
LA diam index: 1.69 cm/m2
LA vol A4C: 42 ml
LA vol index: 24.3 mL/m2
LA vol: 53.2 mL
LDCA: 3.46 cm2
LEFT ATRIUM END SYS DIAM: 37 mm
LV E/e'average: 7.98
LV TDI E'LATERAL: 12.9
LV TDI E'MEDIAL: 13.3
LVEEMED: 7.98
LVELAT: 12.9 cm/s
LVOT diameter: 21 mm
LVOTPV: 110 cm/s
LVOTSV: 83 mL
MV Dec: 197
MV pk E vel: 103 m/s
MVPG: 4 mmHg
MVPKAVEL: 70.7 m/s
PW: 10.7 mm — AB (ref 0.6–1.1)
RV LATERAL S' VELOCITY: 11.1 cm/s
RV sys press: 23 mmHg
Reg peak vel: 221 cm/s
TAPSE: 22.6 mm
TR max vel: 221 cm/s

## 2017-05-01 NOTE — Progress Notes (Signed)
  Echocardiogram 2D Echocardiogram has been performed.  Janet King 05/01/2017, 11:42 AM

## 2017-05-01 NOTE — Progress Notes (Signed)
Patient ID: Janet King, female   DOB: [birth year], 51 y.o.   MRN: [id number]    Cardio-Oncology Clinic Consult Note   Referring Physician: Dr Lindi Adie  HPI:  Janet King is a 51 y.o. female with cancer of the right female breast diagnosed 3/17 referred by Dr. Lindi Adie for enrollment into the cardio-oncology clinic.   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 T2 N0 stage II a clinical stage  03/01/2016 Surgery Right lumpectomy: ILC, grade 2, 0.8 cm, ALH, additional superior margin : ILC grade to 0.3 cm , positive superior margin , 0/1 LN neg, mammoplasty bil : benign, ER 95%, PR 95%, HER-2 pos, Ki-67 5%, T1bN0 stage IA   She started chemotherapy including Herceptin in 5/17.   Overall doing well. Herceptin was held briefly with fall in EF but restarted.  She is taking Coreg and losartan at low doses.  No dyspnea, no chest pain.  She has now completed Herceptin.   Echo 03/24/16 LVEF 65-70%, Ls' 12.0, GLS -19.3% Echo 06/27/16 LVEF 50%, lateral s' 7, GLS -17.2% Echo  08/02/16 60-65%  Lateral s' 11.2 GLS -18.0%  Echo 11/17 EF 55-60%, GLS -19.4%.   Echo 3/18 EF 55-60%, GLS -19.5% Echo 6/18 EF 55-60%, GLS -19.2%  Review of Systems: All systems reviewed and negative except as per HPI.   Past Medical History:  Diagnosis Date  . Baker's cyst of knee   . Breast cancer ([place])    right  . H/O bone density study   . History of radiation therapy 08/10/16-09/16/16   right breast 50.4 Gy  . Hypothyroidism     Current Outpatient Prescriptions  Medication Sig Dispense Refill  . carvedilol (COREG) 3.125 MG tablet Take 1 tablet (3.125 mg total) by mouth 2 (two) times daily. 180 tablet 3  . Cholecalciferol (VITAMIN D3) 50000 units TABS  Take 100,000 Units by mouth once a week.    . levothyroxine (SYNTHROID, LEVOTHROID) 125 MCG tablet Take 125 mcg by mouth daily before breakfast.    . lidocaine-prilocaine (EMLA) cream APPLY TOPICALLY TO THE AFFECTED AREA ONCE DAILY  3  . losartan (COZAAR) 25 MG tablet TAKE 1 TABLET(25 MG) BY MOUTH DAILY 30 tablet 2  . tamoxifen (NOLVADEX) 20 MG tablet Take 1 tablet (20 mg total) by mouth daily. 90 tablet 3  . Vitamin D, Ergocalciferol, (DRISDOL) 50000 units CAPS capsule TK ONE C PO TWICE A WEEK  4   No current facility-administered medications for this encounter.     Allergies  Allergen Reactions  . Penicillin G Rash      Social History   Social History  . Marital status: Single    Spouse name: N/A  . Number of children: 0  . Years of education: N/A   Occupational History  . 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 Smoking status: Never Smoker  . Smokeless tobacco: Never Used  . Alcohol use 0.0 oz/week     Comment: occ - once every 3 mos  . Drug use: No  . Sexual activity: Not Currently   Other Topics Concern  . Not on file   Social History Narrative  . No narrative on file      Family History  Problem Relation Age of  Onset  . Breast cancer Mother 34  . Heart disease Maternal Grandfather   . Colon cancer Paternal Grandmother        dx. 23s  . Cancer Other        maternal great aunt (MGM's sister) dx. with NOS cancer at older age  . Breast cancer Other        maternal great aunt (MGF's sister) dx. in her 53s; s/p mastectomy  . Cancer Other        paternal great aunt (PGM's sister) dx. with NOS cancer at older age    51:   05/01/17 1144  BP: 125/82  Pulse: 83  SpO2: 100%  Weight: 251 lb (113.9 kg)    PHYSICAL EXAM: General:  Well appearing. No respiratory difficulty HEENT: normal Neck: supple. JVP 7 cm. Carotids 2+ bilat; no bruits. No lymphadenopathy or thryomegaly appreciated. Cor: PMI nondisplaced. Regular rate &  rhythm. No rubs, gallops or murmurs. Lungs: CTAB Abdomen: Obese, soft, nontender, nondistended. No hepatosplenomegaly. No bruits or masses. Good bowel sounds. Extremities: no cyanosis, clubbing, rash, edema Neuro: alert & oriented x 3, cranial nerves grossly intact. moves all 4 extremities w/o difficulty. Affect pleasant.   Assessment/ Plan   1. Breast cancer of upper-inner quadrant of right female breast: She started adjuvant therapy 5/17 with Taxol and Herceptin weekly x 12 followed by Herceptin maintenance every 3 weeks 1 year.  Herceptin held after 8/17 echo due to question of early cardio-toxicity and was later restarted. Today's echo reviewed.  It is stable, normal EF and unchanged GLS.  She has now completed Herceptin therapy.  - She will not need further echoes with Herceptin completion. - Now that she is off Herceptin, she can stop losartan today.  In a month, she can stop Coreg.  If BP rises significantly, would consider restarting losartan.   Christoher Drudge French Ana 05/01/2017

## 2017-05-01 NOTE — Patient Instructions (Signed)
STOP Losartan.  STOP Coreg on 06/14/2017  Follow up with Dr.McLean as needed

## 2017-05-02 NOTE — H&P (Signed)
Subjective:     Patient ID: Janet King is a 51 y.o. female.  Follow-up   14 months post op oncoplastic reduction, post reexcision for margins with additional margin clear. Completed adjuvant chemotherapy followed by completion radiation 11.3.17. Completed Herceptin. Here for follow up of right breast wound with recurrent episodes cellulitis and then drainage. MRSA screening negative. No further cellulitis episodes since last visit.  Has had [place] and MMG since last visit. [place] noted 0.5 x 0.4 cm cyst in the same position as the collection in the retroareolar superior right breast imaged and described on the study from December 16, 2016, which measured 0.8 x 1.1 x 0.8 cm. MMG 03/2017 benign, normal post surgical changes.   Presented following screening MMG that with an abnormality in the right breast measuring 5 mm in size. US showed an irregular mass in the right breast 1 o'clock 7 cm from the nipple measuring 3 x 4 x 5 mm. Axilla normal. Biopsy showed an invasive lobular cancer ER/PR +, HER-2 +. MRI revealed a 2.5 cm area of postbiopsy hematoma. Final pathology with lumpectomy specimen 0.8 cm tumor margins clear, however on additional superior margin, additional 0.3 cm invasive lobular ca noted and final superior margin positive. SLN negative. Mammaplasty tissue benign.   Genetics negative. Mother with history breast cancer and underwent bilateral mastectomies with implant reconstruction, reports she had several problems with multiple surgeries.  Prior 46 D/DD. Right lumpectomy 55 g, right reduction 493 g Left reduction 499g,     Objective:   Physical Exam  Cardiovascular: Normal rate, regular rhythm and normal heart sounds.   Pulmonary/Chest: Effort normal and breath sounds normal.   Chest: right breast with induration at UIQ,  12 o clock NAC scar without drainge depression in this area and 5 mm scabbed area SN to nipple R 22. 5 L 26 cm Nipple to IMF R 13 L 12 cm  Assessment:    Right breast cancer s/p lumpectomy, SLN- reexcision margin Oncoplastic reduction S/p adjuvant chemotherapy, XRT Chronic sinus right breast.    Plan:     Pictures today.    Counseled patient constant crusting and low level drainage is not normal 1 year out from surgery. We have discussed possible chronic sinus and excision of sinus tract and any cavity. If we were to address this, a time when area not inflamed/infected would offer better chance healing, though I can never assure her this procedure itself may not be accompanied by wound healing problems.  Expressed concern breasts do not feel normal, tight at chest when lifts arm, notes her mother had multiple surgeries post mastectomy in order to "feel right." Counseled she has had a difficult course which has made this transition more difficult. Reviewed radiation will leave right breast firmer overall, and this has been exacerbated by her infections and wound healing problems.  She has asymmetry breasts with regard to NAC position, do not recommend further intervention for this given her prolonged healing course.  Plan removal port and excision sinus right breast.  Irene Limbo, MD [facility] Plastic & Reconstructive Surgery [phone], pin [phone]

## 2017-05-03 ENCOUNTER — Encounter (HOSPITAL_BASED_OUTPATIENT_CLINIC_OR_DEPARTMENT_OTHER): Payer: Self-pay | Admitting: *Deleted

## 2017-05-09 ENCOUNTER — Encounter (HOSPITAL_BASED_OUTPATIENT_CLINIC_OR_DEPARTMENT_OTHER): Payer: Self-pay

## 2017-05-09 ENCOUNTER — Encounter (HOSPITAL_BASED_OUTPATIENT_CLINIC_OR_DEPARTMENT_OTHER)
Admission: RE | Disposition: A | Discharge status: Home / Self Care | Payer: Self-pay | Source: Ambulatory Visit | Attending: Plastic Surgery

## 2017-05-09 ENCOUNTER — Ambulatory Visit (HOSPITAL_BASED_OUTPATIENT_CLINIC_OR_DEPARTMENT_OTHER)
Admission: RE | Admit: 2017-05-09 | Discharge: 2017-05-09 | Disposition: A | Discharge status: Home / Self Care | Payer: Commercial Managed Care - PPO | Source: Ambulatory Visit | Attending: Plastic Surgery | Admitting: Plastic Surgery

## 2017-05-09 ENCOUNTER — Ambulatory Visit (HOSPITAL_BASED_OUTPATIENT_CLINIC_OR_DEPARTMENT_OTHER): Payer: Commercial Managed Care - PPO | Admitting: Anesthesiology

## 2017-05-09 DIAGNOSIS — N6489 Other specified disorders of breast: Secondary | ICD-10-CM | POA: Insufficient documentation

## 2017-05-09 DIAGNOSIS — Z803 Family history of malignant neoplasm of breast: Secondary | ICD-10-CM | POA: Insufficient documentation

## 2017-05-09 DIAGNOSIS — X58XXXA Exposure to other specified factors, initial encounter: Secondary | ICD-10-CM | POA: Insufficient documentation

## 2017-05-09 DIAGNOSIS — T451X5A Adverse effect of antineoplastic and immunosuppressive drugs, initial encounter: Secondary | ICD-10-CM | POA: Insufficient documentation

## 2017-05-09 DIAGNOSIS — Z923 Personal history of irradiation: Secondary | ICD-10-CM | POA: Diagnosis not present

## 2017-05-09 DIAGNOSIS — Z6839 Body mass index (BMI) 39.0-39.9, adult: Secondary | ICD-10-CM | POA: Insufficient documentation

## 2017-05-09 DIAGNOSIS — Z853 Personal history of malignant neoplasm of breast: Secondary | ICD-10-CM | POA: Diagnosis not present

## 2017-05-09 DIAGNOSIS — I427 Cardiomyopathy due to drug and external agent: Secondary | ICD-10-CM | POA: Insufficient documentation

## 2017-05-09 DIAGNOSIS — N611 Abscess of the breast and nipple: Secondary | ICD-10-CM | POA: Insufficient documentation

## 2017-05-09 DIAGNOSIS — Z452 Encounter for adjustment and management of vascular access device: Secondary | ICD-10-CM | POA: Insufficient documentation

## 2017-05-09 DIAGNOSIS — N641 Fat necrosis of breast: Secondary | ICD-10-CM | POA: Insufficient documentation

## 2017-05-09 DIAGNOSIS — Z9221 Personal history of antineoplastic chemotherapy: Secondary | ICD-10-CM | POA: Insufficient documentation

## 2017-05-09 HISTORY — PX: EXCISION OF BREAST LESION: SHX6676

## 2017-05-09 HISTORY — PX: PORT-A-CATH REMOVAL: SHX5289

## 2017-05-09 SURGERY — REMOVAL PORT-A-CATH
Anesthesia: General | Site: Chest | Laterality: Right

## 2017-05-09 MED ORDER — DEXAMETHASONE SODIUM PHOSPHATE 4 MG/ML IJ SOLN
INTRAMUSCULAR | Status: DC | PRN
  Administered 2017-05-09: 10 mg via INTRAVENOUS

## 2017-05-09 MED ORDER — PHENYLEPHRINE 40 MCG/ML (10ML) SYRINGE FOR IV PUSH (FOR BLOOD PRESSURE SUPPORT)
PREFILLED_SYRINGE | INTRAVENOUS | Status: AC
  Filled 2017-05-09: qty 10

## 2017-05-09 MED ORDER — ONDANSETRON HCL 4 MG/2ML IJ SOLN
INTRAMUSCULAR | Status: AC
  Filled 2017-05-09: qty 2

## 2017-05-09 MED ORDER — MIDAZOLAM HCL 2 MG/2ML IJ SOLN
1.0000 mg | INTRAMUSCULAR | Status: DC | PRN
  Administered 2017-05-09: 2 mg via INTRAVENOUS

## 2017-05-09 MED ORDER — ONDANSETRON HCL 4 MG/2ML IJ SOLN
INTRAMUSCULAR | Status: DC | PRN
  Administered 2017-05-09: 4 mg via INTRAVENOUS

## 2017-05-09 MED ORDER — CHLORHEXIDINE GLUCONATE CLOTH 2 % EX PADS: 6.0000 | MEDICATED_PAD | Freq: Once | CUTANEOUS | Status: DC

## 2017-05-09 MED ORDER — FENTANYL CITRATE (PF) 100 MCG/2ML IJ SOLN
50.0000 ug | INTRAMUSCULAR | Status: DC | PRN
  Administered 2017-05-09 (×2): 50 ug via INTRAVENOUS

## 2017-05-09 MED ORDER — PROMETHAZINE HCL 25 MG/ML IJ SOLN: 6.2500 mg | INTRAMUSCULAR | Status: DC | PRN

## 2017-05-09 MED ORDER — SCOPOLAMINE 1 MG/3DAYS TD PT72: 1.0000 | MEDICATED_PATCH | Freq: Once | TRANSDERMAL | Status: DC | PRN

## 2017-05-09 MED ORDER — FENTANYL CITRATE (PF) 100 MCG/2ML IJ SOLN: 25.0000 ug | INTRAMUSCULAR | Status: DC | PRN

## 2017-05-09 MED ORDER — SUCCINYLCHOLINE CHLORIDE 200 MG/10ML IV SOSY
PREFILLED_SYRINGE | INTRAVENOUS | Status: AC
  Filled 2017-05-09: qty 10

## 2017-05-09 MED ORDER — BUPIVACAINE-EPINEPHRINE 0.25% -1:200000 IJ SOLN
INTRAMUSCULAR | Status: DC | PRN
  Administered 2017-05-09: 8 mL

## 2017-05-09 MED ORDER — BUPIVACAINE-EPINEPHRINE (PF) 0.5% -1:200000 IJ SOLN
INTRAMUSCULAR | Status: AC
  Filled 2017-05-09: qty 60

## 2017-05-09 MED ORDER — MIDAZOLAM HCL 2 MG/2ML IJ SOLN
INTRAMUSCULAR | Status: AC
  Filled 2017-05-09: qty 2

## 2017-05-09 MED ORDER — LACTATED RINGERS IV SOLN
INTRAVENOUS | Status: DC
  Administered 2017-05-09: 07:00:00 via INTRAVENOUS

## 2017-05-09 MED ORDER — CLINDAMYCIN PHOSPHATE 900 MG/50ML IV SOLN
INTRAVENOUS | Status: DC | PRN
  Administered 2017-05-09: 900 mg via INTRAVENOUS

## 2017-05-09 MED ORDER — FENTANYL CITRATE (PF) 100 MCG/2ML IJ SOLN
INTRAMUSCULAR | Status: AC
  Filled 2017-05-09: qty 2

## 2017-05-09 MED ORDER — LIDOCAINE 2% (20 MG/ML) 5 ML SYRINGE
INTRAMUSCULAR | Status: DC | PRN
  Administered 2017-05-09: 50 mg via INTRAVENOUS

## 2017-05-09 MED ORDER — CLINDAMYCIN PHOSPHATE 900 MG/50ML IV SOLN
INTRAVENOUS | Status: AC
  Filled 2017-05-09: qty 50

## 2017-05-09 MED ORDER — BUPIVACAINE-EPINEPHRINE (PF) 0.25% -1:200000 IJ SOLN
INTRAMUSCULAR | Status: AC
  Filled 2017-05-09: qty 60

## 2017-05-09 MED ORDER — PROPOFOL 10 MG/ML IV BOLUS
INTRAVENOUS | Status: DC | PRN
  Administered 2017-05-09: 200 mg via INTRAVENOUS

## 2017-05-09 MED ORDER — CLINDAMYCIN PHOSPHATE 600 MG/50ML IV SOLN: 600.0000 mg | Freq: Once | INTRAVENOUS | Status: DC

## 2017-05-09 MED ORDER — DEXAMETHASONE SODIUM PHOSPHATE 10 MG/ML IJ SOLN
INTRAMUSCULAR | Status: AC
  Filled 2017-05-09: qty 1

## 2017-05-09 MED ORDER — ACETAMINOPHEN 500 MG PO TABS
1000.0000 mg | ORAL_TABLET | Freq: Once | ORAL | Status: AC
  Administered 2017-05-09: 1000 mg via ORAL

## 2017-05-09 MED ORDER — ROCURONIUM BROMIDE 10 MG/ML (PF) SYRINGE
PREFILLED_SYRINGE | INTRAVENOUS | Status: AC
  Filled 2017-05-09: qty 5

## 2017-05-09 MED ORDER — ACETAMINOPHEN 500 MG PO TABS
ORAL_TABLET | ORAL | Status: AC
  Filled 2017-05-09: qty 2

## 2017-05-09 MED ORDER — EPHEDRINE 5 MG/ML INJ
INTRAVENOUS | Status: AC
  Filled 2017-05-09: qty 10

## 2017-05-09 MED ORDER — LIDOCAINE 2% (20 MG/ML) 5 ML SYRINGE
INTRAMUSCULAR | Status: AC
  Filled 2017-05-09: qty 5

## 2017-05-09 SURGICAL SUPPLY — 49 items
ADH SKN CLS APL DERMABOND .7 (GAUZE/BANDAGES/DRESSINGS) ×2
APL SKNCLS STERI-STRIP NONHPOA (GAUZE/BANDAGES/DRESSINGS)
BENZOIN TINCTURE PRP APPL 2/3 (GAUZE/BANDAGES/DRESSINGS) IMPLANT
BLADE CLIPPER SURG (BLADE) IMPLANT
BLADE SURG 10 STRL SS (BLADE) ×3 IMPLANT
BLADE SURG 15 STRL LF DISP TIS (BLADE) ×2 IMPLANT
BLADE SURG 15 STRL SS (BLADE) ×4
CANISTER SUCTION 1200CC (MISCELLANEOUS) ×2 IMPLANT
CHLORAPREP W/TINT 26ML (MISCELLANEOUS) ×4 IMPLANT
CLOSURE WOUND 1/2 X4 (GAUZE/BANDAGES/DRESSINGS)
COVER BACK TABLE 60X90IN (DRAPES) ×4 IMPLANT
COVER MAYO STAND STRL (DRAPES) ×4 IMPLANT
DERMABOND ADVANCED (GAUZE/BANDAGES/DRESSINGS) ×2
DERMABOND ADVANCED .7 DNX12 (GAUZE/BANDAGES/DRESSINGS) IMPLANT
DRAPE LAPAROTOMY 100X72 PEDS (DRAPES) ×2 IMPLANT
ELECT COATED BLADE 2.86 ST (ELECTRODE) ×2 IMPLANT
ELECT REM PT RETURN 9FT ADLT (ELECTROSURGICAL) ×4
ELECTRODE REM PT RTRN 9FT ADLT (ELECTROSURGICAL) ×1 IMPLANT
GAUZE SPONGE 4X4 12PLY STRL LF (GAUZE/BANDAGES/DRESSINGS) IMPLANT
GLOVE BIO SURGEON STRL SZ 6 (GLOVE) ×4 IMPLANT
GLOVE BIOGEL PI IND STRL 7.0 (GLOVE) ×4 IMPLANT
GLOVE BIOGEL PI INDICATOR 7.0 (GLOVE) ×8
GLOVE ECLIPSE 6.5 STRL STRAW (GLOVE) ×4 IMPLANT
GOWN STRL REUS W/ TWL LRG LVL3 (GOWN DISPOSABLE) ×6 IMPLANT
GOWN STRL REUS W/TWL LRG LVL3 (GOWN DISPOSABLE) ×16
LIQUID BAND (GAUZE/BANDAGES/DRESSINGS) IMPLANT
NDL HYPO 30GX1 BEV (NEEDLE) IMPLANT
NDL PRECISIONGLIDE 27X1.5 (NEEDLE) ×2 IMPLANT
NEEDLE HYPO 30GX1 BEV (NEEDLE) IMPLANT
NEEDLE PRECISIONGLIDE 27X1.5 (NEEDLE) ×4 IMPLANT
NS IRRIG 1000ML POUR BTL (IV SOLUTION) ×2 IMPLANT
PACK BASIN DAY SURGERY FS (CUSTOM PROCEDURE TRAY) ×4 IMPLANT
PENCIL BUTTON HOLSTER BLD 10FT (ELECTRODE) ×2 IMPLANT
SPONGE GAUZE 2X2 8PLY STER LF (GAUZE/BANDAGES/DRESSINGS)
SPONGE GAUZE 2X2 8PLY STRL LF (GAUZE/BANDAGES/DRESSINGS) IMPLANT
SPONGE LAP 18X18 X RAY DECT (DISPOSABLE) ×2 IMPLANT
STRIP CLOSURE SKIN 1/2X4 (GAUZE/BANDAGES/DRESSINGS) IMPLANT
SUT ETHILON 4 0 PS 2 18 (SUTURE) ×2 IMPLANT
SUT MNCRL AB 3-0 PS2 18 (SUTURE) ×2 IMPLANT
SUT MNCRL AB 4-0 PS2 18 (SUTURE) ×2 IMPLANT
SUT MON AB 2-0 CT1 36 (SUTURE) ×2 IMPLANT
SUT VICRYL 4-0 PS2 18IN ABS (SUTURE) ×2 IMPLANT
SYR BULB 3OZ (MISCELLANEOUS) ×2 IMPLANT
SYR CONTROL 10ML LL (SYRINGE) ×4 IMPLANT
TOWEL OR 17X24 6PK STRL BLUE (TOWEL DISPOSABLE) ×6 IMPLANT
TRAY DSU PREP LF (CUSTOM PROCEDURE TRAY) IMPLANT
TUBE CONNECTING 20'X1/4 (TUBING) ×1
TUBE CONNECTING 20X1/4 (TUBING) ×1 IMPLANT
YANKAUER SUCT BULB TIP NO VENT (SUCTIONS) ×2 IMPLANT

## 2017-05-09 NOTE — Op Note (Signed)
Operative Note   DATE OF OPERATION: 6.26.18  LOCATION: [facility]-outpatient  SURGICAL DIVISION: Plastic Surgery  PREOPERATIVE DIAGNOSES:  1. History right breast cancer 2. S/p reduction mammaplasty 3. History therapeutic radiation 4. Chronic sinus right breast  POSTOPERATIVE DIAGNOSES:  same  PROCEDURE:  1. Removal right chest port 2. Excision right breast sinus cavity  SURGEON: Irene Limbo MD MBA  ASSISTANT: none  ANESTHESIA:  General.   EBL: minimal  COMPLICATIONS: None immediate.   INDICATIONS FOR PROCEDURE:  The patient, Janet King, is a 51 y.o. female born on 03/27/1966, is here for removal post following completion adjuvant chemotherapy. She has a history of right breast cancer and underwent right lumpectomy with immediate oncoplastic reduction. Course complicated by positive margins requiring reoperation and subsequent development chronic wound of breast. At this time patient has intermittent drainage from small wound at 12 o clock right nipple areolar complex. She has previous US of this indicating small sinus cavity.   FINDINGS: 2 mm ulcer at 12 o clock right NAC. With probing, this tracks 2 cm depth. Cavity right breast excised consistent with fat necrosis, chronic sinus cavity.  DESCRIPTION OF PROCEDURE:  The patient's operative site was marked with the patient in the preoperative area. The patient was taken to the operating room. SCDs were placed and IV antibiotics were given. The patient's operative site was prepped and draped in a sterile fashion. A time out was performed and all information was confirmed to be correct. Local anesthetic infiltrated surrounding planned incisions. Incision made in right chest port scar. Incision carried through superficial fascia and capsule. Sutures removed and port removed intact. Layered closure completed with 4-0 vicryl in superficial fascia, 4-0 vicryl in dermis, and running 4-0 monocryl subcuticular skin closure.  Tissue adhesive applied.  Elliptical incision around right breast ulcer at 2 o clock of NAC completed. This was resected en block with probed cavity. Additional tissue consistent with scar and fat necrosis in area excised. Wound irrigated. Cavity approximated with interrupted 2-0 monocryl. 3-0 monocryl placed in dermis. 4-0 nylon running horizontal mattress used to closed skin. Dry dressing applied. Length closure 2.5 cm.  The patient was allowed to wake from anesthesia, extubated and taken to the recovery room in satisfactory condition.   SPECIMENS: right breast sinus  DRAINS: none  Irene Limbo, MD [facility] Plastic & Reconstructive Surgery [phone], pin [phone]

## 2017-05-09 NOTE — Transfer of Care (Signed)
Immediate Anesthesia Transfer of Care Note  Patient: Janet King  Procedure(s) Performed: Procedure(s): REMOVAL PORT-A-CATH AND EXCISION OF ULCER RIGHT BREAST (Right) EXCISION OF BREAST LESION (Right)  Patient Location: PACU  Anesthesia Type:General  Level of Consciousness: awake, alert  and oriented  Airway & Oxygen Therapy: Patient Spontanous Breathing and Patient connected to face mask oxygen  Post-op Assessment: Report given to RN and Post -op Vital signs reviewed and stable  Post vital signs: Reviewed  Last Vitals:  Vitals:   05/09/17 0638  BP: 135/85  Pulse: 79  Resp: 18  Temp: 36.7 C    Last Pain:  Vitals:   05/09/17 0638  TempSrc: Oral         Complications: No apparent anesthesia complications

## 2017-05-09 NOTE — Anesthesia Postprocedure Evaluation (Signed)
Anesthesia Post Note  Patient: Tori Milks [place]  Procedure(s) Performed: Procedure(s) (LRB): REMOVAL PORT-A-CATH AND EXCISION OF ULCER RIGHT BREAST (Right) EXCISION OF BREAST LESION (Right)     Patient location during evaluation: PACU Anesthesia Type: General Level of consciousness: awake and alert Pain management: pain level controlled Vital Signs Assessment: post-procedure vital signs reviewed and stable Respiratory status: spontaneous breathing, nonlabored ventilation, respiratory function stable and patient connected to nasal cannula oxygen Cardiovascular status: blood pressure returned to baseline and stable Postop Assessment: no signs of nausea or vomiting Anesthetic complications: no    Last Vitals:  Vitals:   05/09/17 0900 05/09/17 0945  BP: 121/68 113/64  Pulse: 74 73  Resp: 17 18  Temp:  36.9 C    Last Pain:  Vitals:   05/09/17 0945  TempSrc:   PainSc: 0-No pain                 Catalina Gravel

## 2017-05-09 NOTE — Discharge Instructions (Signed)

## 2017-05-09 NOTE — Anesthesia Procedure Notes (Signed)
Procedure Name: LMA Insertion Date/Time: 05/09/2017 7:27 AM Performed by: Bufford Spikes Pre-anesthesia Checklist: Patient identified, Emergency Drugs available, Suction available and Patient being monitored Patient Re-evaluated:Patient Re-evaluated prior to inductionOxygen Delivery Method: Circle system utilized Preoxygenation: Pre-oxygenation with 100% oxygen Intubation Type: IV induction Ventilation: Mask ventilation without difficulty LMA: LMA inserted LMA Size: 4.0 Number of attempts: 1 Placement Confirmation: positive ETCO2 Tube secured with: Tape Dental Injury: Teeth and Oropharynx as per pre-operative assessment

## 2017-05-09 NOTE — Interval H&P Note (Signed)
History and Physical Interval Note:  05/09/2017 7:02 AM  Janet King  has presented today for surgery, with the diagnosis of HISTORY OF RIGHT BREAST CANCER  The various methods of treatment have been discussed with the patient and family. After consideration of risks, benefits and other options for treatment, the patient has consented to  Procedure(s): REMOVAL PORT-A-CATH AND EXCISION OF ULCER RIGHT BREAST (Right) as a surgical intervention .  The patient's history has been reviewed, patient examined, no change in status, stable for surgery.  I have reviewed the patient's chart and labs.  Questions were answered to the patient's satisfaction.     Areal Cochrane

## 2017-05-09 NOTE — Anesthesia Preprocedure Evaluation (Addendum)
Anesthesia Evaluation  Patient identified by MRN, date of birth, ID band Patient awake    Reviewed: Allergy & Precautions, NPO status , Patient's Chart, lab work & pertinent test results, reviewed documented beta blocker date and time   Airway Mallampati: III  TM Distance: <3 FB Neck ROM: Full    Dental  (+) Teeth Intact, Dental Advisory Given   Pulmonary neg pulmonary ROS,    Pulmonary exam normal breath sounds clear to auscultation       Cardiovascular (-) hypertension(-) angina(-) CAD Normal cardiovascular exam Rhythm:Regular Rate:Normal  Chemotherapy induced cardiomyopathy--takes carvedilol   Neuro/Psych negative neurological ROS     GI/Hepatic negative GI ROS, Neg liver ROS,   Endo/Other  Hypothyroidism Morbid obesity  Renal/GU negative Renal ROS     Musculoskeletal negative musculoskeletal ROS (+)   Abdominal   Peds  Hematology negative hematology ROS (+)   Anesthesia Other Findings Day of surgery medications reviewed with the patient.  H/o right breast cancer  Reproductive/Obstetrics                            Anesthesia Physical Anesthesia Plan  ASA: III  Anesthesia Plan: General   Post-op Pain Management:    Induction: Intravenous  PONV Risk Score and Plan: 3 and Ondansetron, Dexamethasone, Propofol and Midazolam  Airway Management Planned: Oral ETT  Additional Equipment:   Intra-op Plan:   Post-operative Plan: Extubation in OR  Informed Consent: I have reviewed the patients History and Physical, chart, labs and discussed the procedure including the risks, benefits and alternatives for the proposed anesthesia with the patient or authorized representative who has indicated his/her understanding and acceptance.   Dental advisory given  Plan Discussed with: CRNA  Anesthesia Plan Comments: (Risks/benefits of general anesthesia discussed with patient including risk of  damage to teeth, lips, gum, and tongue, nausea/vomiting, allergic reactions to medications, and the possibility of heart attack, stroke and death.  All patient questions answered.  Patient wishes to proceed.)        Anesthesia Quick Evaluation

## 2017-05-10 ENCOUNTER — Encounter (HOSPITAL_BASED_OUTPATIENT_CLINIC_OR_DEPARTMENT_OTHER): Payer: Self-pay | Admitting: Plastic Surgery

## 2017-05-19 ENCOUNTER — Ambulatory Visit
Admission: RE | Admit: 2017-05-19 | Discharge: 2017-05-19 | Disposition: A | Discharge status: Home / Self Care | Payer: Commercial Managed Care - PPO | Source: Ambulatory Visit | Attending: Endocrinology | Admitting: Endocrinology

## 2017-05-19 DIAGNOSIS — E049 Nontoxic goiter, unspecified: Secondary | ICD-10-CM

## 2017-05-24 ENCOUNTER — Other Ambulatory Visit: Payer: Commercial Managed Care - PPO

## 2017-06-19 ENCOUNTER — Ambulatory Visit (HOSPITAL_BASED_OUTPATIENT_CLINIC_OR_DEPARTMENT_OTHER): Payer: Commercial Managed Care - PPO | Admitting: Adult Health

## 2017-06-19 ENCOUNTER — Encounter: Payer: Self-pay | Admitting: Adult Health

## 2017-06-19 VITALS — BP 130/93 | HR 113 | Temp 98.4°F | Resp 20 | Ht 66.5 in | Wt 248.5 lb

## 2017-06-19 DIAGNOSIS — Z170 Estrogen receptor positive status [ER+]: Secondary | ICD-10-CM

## 2017-06-19 DIAGNOSIS — C50211 Malignant neoplasm of upper-inner quadrant of right female breast: Secondary | ICD-10-CM | POA: Diagnosis not present

## 2017-06-19 NOTE — Progress Notes (Signed)
CLINIC:  Survivorship   REASON FOR VISIT:  Routine follow-up post-treatment for a recent history of breast cancer.  BRIEF ONCOLOGIC HISTORY:    Breast cancer of upper-inner quadrant of right female breast ([place])   01/21/2016 Initial Diagnosis    Right breast biopsy: Invasive lobular cancer, ER 95%, PR 95%, Ki-67 5%, HER-2 positive ratio 2.52      02/01/2016 Breast MRI    Right breast: Post biopsy hematoma measuring 1.9 x 1.3 x 2.5 cm surrounding ring of reactive enhancement, no lymphadenopathy. T2 N0 stage II a clinical stage      03/01/2016 Surgery    Right lumpectomy: ILC, grade 2, 0.8 cm, ALH, additional superior margin : ILC grade to 0.3 cm , positive superior margin , 0/1 LN neg, mammoplasty bil : benign, ER 95%, PR 95%, HER-2 pos, Ki-67 5%, T1bN0 stage IA      03/15/2016 Surgery    Right breast. Margin excision: Benign      04/20/2016 - 04/17/2017 Chemotherapy    Taxol Herceptin weekly 12 followed by Herceptin maintenance for 1 year      08/10/2016 - 09/16/2016 Radiation Therapy    Adjuvant radiation therapy      11/14/2016 -  Anti-estrogen oral therapy    Tamoxifen 20 mg daily       INTERVAL HISTORY:  [name] presents to the [facility] today for our initial meeting to review her survivorship care plan detailing her treatment course for breast cancer, as well as monitoring long-term side effects of that treatment, education regarding health maintenance, screening, and overall wellness and health promotion.     Overall, [name] reports feeling quite well.  She is taking Tamoxifen daily.  She is unhappy because of her hot flashes.  She has them worse at night.  She hasn't tried anything for them.      REVIEW OF SYSTEMS:  Review of Systems  Constitutional: Negative for appetite change, chills, fatigue, fever and unexpected weight change.  HENT:   Negative for hearing loss and lump/mass.   Eyes: Negative for eye problems and icterus.  Respiratory: Negative  for chest tightness, cough and shortness of breath.   Cardiovascular: Negative for chest pain, leg swelling and palpitations.  Gastrointestinal: Negative for abdominal distention, abdominal pain, constipation, diarrhea, nausea and vomiting.  Endocrine: Positive for hot flashes.  Musculoskeletal: Negative for arthralgias and back pain.  Skin: Negative for itching and rash.  Neurological: Negative for dizziness, extremity weakness and headaches.  Hematological: Negative for adenopathy. Does not bruise/bleed easily.  Psychiatric/Behavioral: Negative for depression. The patient is not nervous/anxious.   Breast: Denies any new nodularity, masses, tenderness, nipple changes, or nipple discharge.      ONCOLOGY TREATMENT TEAM:  1. Surgeon:  Dr. Donne Hazel at [facility] 2. Medical Oncologist: Dr. Lindi Adie  3. Radiation Oncologist: Dr. Sondra Come    PAST MEDICAL/SURGICAL HISTORY:  Past Medical History:  Diagnosis Date  . Baker's cyst of knee   . Breast cancer ([place])    right  . H/O bone density study   . History of radiation therapy 08/10/16-09/16/16   right breast 50.4 Gy  . Hypothyroidism    Past Surgical History:  Procedure Laterality Date  . BREAST LUMPECTOMY     invasive carcinoma right april 2017  . BREAST RECONSTRUCTION Bilateral 03/01/2016   Procedure: RIGHT ONCOPLASTIC BREAST RECONSTRUCTION WITH LEFT REDUCTION FOR SYMMETRY;  Surgeon: Irene Limbo, MD;  Location: [facility];  Service: Plastics;  Laterality: Bilateral;  . EXCISION  OF BREAST LESION Right 05/09/2017   Procedure: EXCISION OF BREAST LESION;  Surgeon: Irene Limbo, MD;  Location: [facility];  Service: Plastics;  Laterality: Right;  . PERIPHERAL VASCULAR CATHETERIZATION Right 03/01/2016   Procedure: PORTA CATH INSERTION;  Surgeon: Rolm Bookbinder, MD;  Location: [facility];  Service: General;  Laterality: Right;  . PORT-A-CATH REMOVAL Right 05/09/2017    Procedure: REMOVAL PORT-A-CATH AND EXCISION OF ULCER RIGHT BREAST;  Surgeon: Irene Limbo, MD;  Location: [facility];  Service: Plastics;  Laterality: Right;  . RADIOACTIVE SEED GUIDED MASTECTOMY WITH AXILLARY SENTINEL LYMPH NODE BIOPSY Right 03/01/2016   Procedure: RADIOACTIVE SEED GUIDED PARTIAL MASTECTOMY WITH AXILLARY SENTINEL LYMPH NODE BIOPSY;  Surgeon: Rolm Bookbinder, MD;  Location: [facility];  Service: General;  Laterality: Right;  . RE-EXCISION OF BREAST CANCER,SUPERIOR MARGINS Right 03/15/2016   Procedure: RE-EXCISION OF BREAST CANCER, SUPERIOR MARGINS;  Surgeon: Rolm Bookbinder, MD;  Location: [facility];  Service: General;  Laterality: Right;  . REDUCTION MAMMAPLASTY     bilateral 2017  . TONSILLECTOMY       ALLERGIES:  Allergies  Allergen Reactions  . Adhesive [Tape] Other (See Comments)    Redness   . Penicillin G Rash     CURRENT MEDICATIONS:  Outpatient Encounter Prescriptions as of 06/19/2017  Medication Sig Note  . levothyroxine (SYNTHROID, LEVOTHROID) 125 MCG tablet Take 125 mcg by mouth daily before breakfast.   . tamoxifen (NOLVADEX) 20 MG tablet Take 1 tablet (20 mg total) by mouth daily.   . Vitamin D, Ergocalciferol, (DRISDOL) 50000 units CAPS capsule TK ONE C PO TWICE A WEEK   . [DISCONTINUED] carvedilol (COREG) 3.125 MG tablet Take 1 tablet (3.125 mg total) by mouth 2 (two) times daily.   . [DISCONTINUED] lidocaine-prilocaine (EMLA) cream APPLY TOPICALLY TO THE AFFECTED AREA ONCE DAILY 05/09/2017: Only used with chemo    No facility-administered encounter medications on file as of 06/19/2017.      ONCOLOGIC FAMILY HISTORY:  Family History  Problem Relation Age of Onset  . Breast cancer Mother 49  . Heart disease Maternal Grandfather   . Colon cancer Paternal Grandmother        dx. 47s  . Cancer Other        maternal great aunt (MGM's sister) dx. with NOS cancer at older age  . Breast cancer Other         maternal great aunt (MGF's sister) dx. in her 61s; s/p mastectomy  . Cancer Other        paternal great aunt (PGM's sister) dx. with NOS cancer at older age    SOCIAL HISTORY:  Janet King is single and lives in [place], [place].  She denies any current or history of tobacco, alcohol, or illicit drug use.     PHYSICAL EXAMINATION:  Vital Signs:   Vitals:   06/19/17 1434  BP: (!) 130/93  Pulse: (!) 113  Resp: 20  Temp: 98.4 F (36.9 C)   Filed Weights   06/19/17 1434  Weight: 248 lb 8 oz (112.7 kg)   General: Well-nourished, well-appearing female in no acute distress.  She is unaccompanied today.   HEENT: Head is normocephalic.  Pupils equal and reactive to light. Conjunctivae clear without exudate.  Sclerae anicteric. Oral mucosa is pink, moist.  Oropharynx is pink without lesions or erythema.  Lymph: No cervical, supraclavicular, or infraclavicular lymphadenopathy noted on palpation.  Cardiovascular: Regular rate and rhythm.Marland Kitchen Respiratory: Clear  to auscultation bilaterally. Chest expansion symmetric; breathing non-labored.  GI: Abdomen soft and round; non-tender, non-distended. Bowel sounds normoactive.  GU: Deferred.  Neuro: No focal deficits. Steady gait.  Psych: Mood and affect normal and appropriate for situation.  Extremities: No edema. MSK: No focal spinal tenderness to palpation.  Full range of motion in bilateral upper extremities Skin: Warm and dry.  LABORATORY DATA:  None for this visit.  DIAGNOSTIC IMAGING:  None for this visit.      ASSESSMENT AND PLAN:  Ms.. Janet King is a pleasant 51 y.o. 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  She presents to the Survivorship Clinic for our initial meeting and routine follow-up  post-completion of treatment for breast cancer.    1. Stage IA right breast cancer:  [name] is continuing to recover from definitive treatment for breast cancer. She will follow-up with her medical oncologist, Dr. Lindi Adie in 6 months with history and physical exam per surveillance protocol.  She will continue her anti-estrogen therapy with Tamoxifen. Thus far, she is tolerating the Tamoxifen well, with minimal side effects. She was instructed to make Dr. Lindi Adie or myself aware if she begins to experience any worsening side effects of the medication and I could see her back in clinic to help manage those side effects, as needed. Though the incidence is low, there is an associated risk of endometrial cancer with anti-estrogen therapies like Tamoxifen.  [name] was encouraged to contact Dr. Lindi Adie or myself with any vaginal bleeding while taking Tamoxifen. Other side effects of Tamoxifen were again reviewed with her as well. Today, a comprehensive survivorship care plan and treatment summary was reviewed with the patient today detailing her breast cancer diagnosis, treatment course, potential late/long-term effects of treatment, appropriate follow-up care with recommendations for the future, and patient education resources.  A copy of this summary, along with a letter will be sent to the patient's primary care provider via mail/fax/In Basket message after today's visit.    2. Bone health:  Given [name]'s history of breast cancer, she is at slight risk for bone demineralization.  I counseled her that Tamoxifen has a protective effect on her bones.  She was given education on specific activities to promote bone health.  3. Cancer screening:  Due to [name]'s history and her age, she should receive screening for skin cancers, colon cancer, and gynecologic cancers.  The information and recommendations are listed on the patient's comprehensive care plan/treatment summary and were reviewed in detail with  the patient.    4. Health maintenance and wellness promotion: [name] was encouraged to consume 5-7 servings of fruits and vegetables per day. We reviewed the "Nutrition Rainbow" handout, as well as the handout "Take Control of Your Health and Reduce Your Cancer Risk" from the [employer].  She was also encouraged to engage in moderate to vigorous exercise for 30 minutes per day most days of the week. We discussed the [employer] fitness program, which is designed for cancer survivors to help them become more physically fit after cancer treatments.  She was instructed to limit her alcohol consumption and continue to abstain from tobacco use.     5. Support services/counseling: It is not uncommon for this period of the patient's cancer care trajectory to be one of many emotions and stressors.  We discussed  an opportunity for her to participate in the next session of [facility] ("Finding Your New Normal") support group series designed for patients after they have completed treatment.   [name] was encouraged to take advantage of our many other support services programs, support groups, and/or counseling in coping with her new life as a cancer survivor after completing anti-cancer treatment.  She was offered support today through active listening and expressive supportive counseling.  She was given information regarding our available services and encouraged to contact me with any questions or for help enrolling in any of our support group/programs.    Dispo:   -Return to cancer center in 6 months for follow up with Dr. Lindi Adie  -Mammogram due in 03/2018 -Follow up with surgery in December, 2018.   -She is welcome to return back to the Survivorship Clinic at any time; no additional follow-up needed at this time.  -Consider referral back to survivorship as a long-term survivor for continued surveillance  A total of (30) minutes of face-to-face time was spent with this patient with greater than  50% of that time in counseling and care-coordination.   Gardenia Phlegm, NP Survivorship Program [place] [phone]   Note: PRIMARY CARE PROVIDER [name], [place] [phone]  scp

## 2017-06-29 ENCOUNTER — Encounter: Payer: Self-pay | Admitting: Obstetrics and Gynecology

## 2017-08-29 ENCOUNTER — Encounter: Payer: Self-pay | Admitting: Pharmacist

## 2017-08-29 DIAGNOSIS — C50211 Malignant neoplasm of upper-inner quadrant of right female breast: Secondary | ICD-10-CM

## 2017-08-29 DIAGNOSIS — Z170 Estrogen receptor positive status [ER+]: Secondary | ICD-10-CM

## 2017-08-29 NOTE — Progress Notes (Signed)
Telephone documentation  Study code: [id number]  Spoke with patient over the phone today and informed her that her buccal swab for "Pharmacogenetic analysis of toxicities related to administration of taxanes in breast cancer patients" study did NOT contain enough DNA to yield results. Patient did not have any questions.  Darl Pikes, PharmD, BCPS Hematology/Oncology Clinical Pharmacist [facility] [phone]

## 2017-11-01 ENCOUNTER — Other Ambulatory Visit: Payer: Self-pay | Admitting: Hematology and Oncology

## 2017-12-06 ENCOUNTER — Telehealth: Payer: Self-pay

## 2017-12-06 NOTE — Telephone Encounter (Signed)
TC to pt regarding her appt with Dr Lindi Adie for 12/18/17.  She reports she just saw Dr Donne Hazel in Dec 2018 and was told that she could alternate between them, making her Dr Lindi Adie appt to June instead of Feb.  Cancelled Feb appt for pt and encouraged pt to call with any concerns or questions before next appt with Dr Lindi Adie in 6 months. Pt voiced understanding. Left in-basket for scheduling appt with Dr Lindi Adie for in 6 mths and call pt with date/time.

## 2017-12-08 ENCOUNTER — Telehealth: Payer: Self-pay | Admitting: Hematology and Oncology

## 2017-12-08 NOTE — Telephone Encounter (Signed)
Mailed calendar of upcoming July appointments to patient. Patient scheduled per 1/23 sch message.

## 2017-12-18 ENCOUNTER — Ambulatory Visit: Payer: Commercial Managed Care - PPO | Admitting: Hematology and Oncology

## 2017-12-20 ENCOUNTER — Ambulatory Visit: Payer: Commercial Managed Care - PPO | Admitting: Hematology and Oncology

## 2018-03-14 IMAGING — MR MR BREAST BILAT WO/W CM
9 of 13 series · 33 of 48 positions shown · IV contrast (multihance)
Comparison: Previous mammograms and breast ultrasound.

CLINICAL DATA: Newly diagnosed invasive lobular carcinoma of the
upper inner right breast. Staging exam.

LABS:  Creatinine, 0.8 mg/dl drawn on 01/27/2016
EXAM:
BILATERAL BREAST MRI WITH AND WITHOUT CONTRAST
TECHNIQUE: Multiplanar, multisequence MR images of both breasts were obtained
prior to and following the intravenous administration of 20 ml of
MultiHance.

[Series 2: T2 fat-sat · axial · 3.0mm · 0.99mm/px · 1 of 57 slices shown]
[im 1/57]
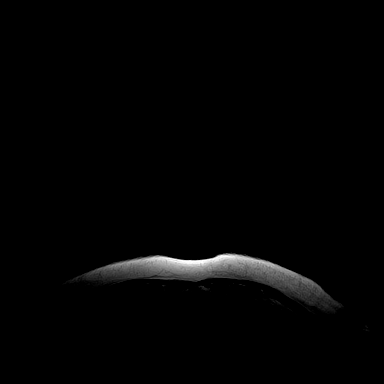

[Series 3: t2_tirm_tra ipat (a-p) · axial · 3.0mm · 0.74mm/px · 1 of 57 slices shown]
[im 1/57]
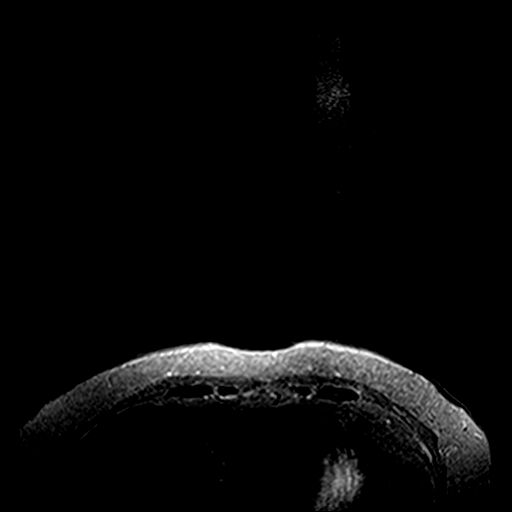

[Series 4: fl3d pre-cm no · axial · non-contrast · 1.2mm · 0.99mm/px · z∈[-96,+95]mm · 5 of 160 slices shown]
[im 1/160]
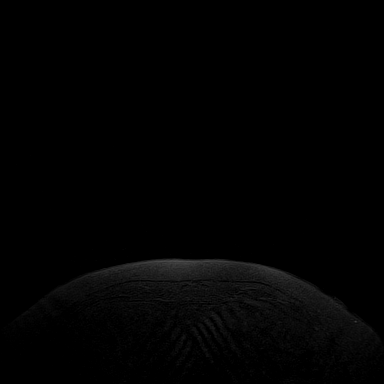
[im 40/160]
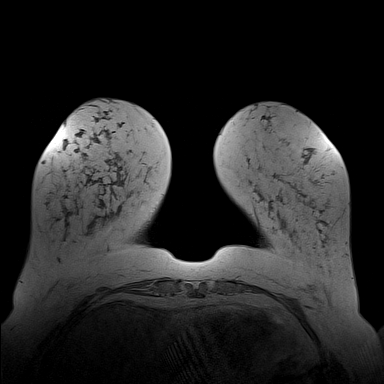
[im 80/160]
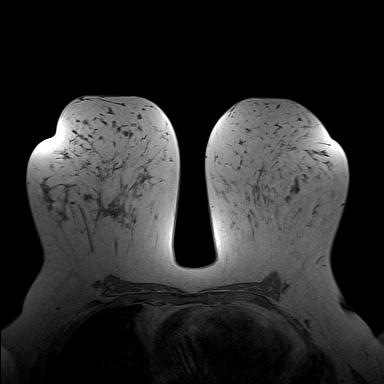
[im 120/160]
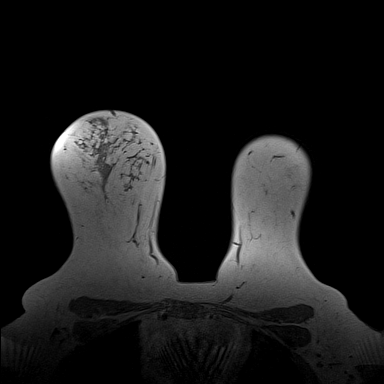
[im 160/160]
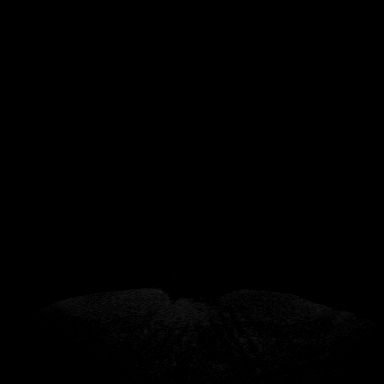

[Series 5: fl3d pre-cm · axial · non-contrast · 1.2mm · 0.99mm/px · z∈[-96,+95]mm · 5 of 160 slices shown]
[im 1/160]
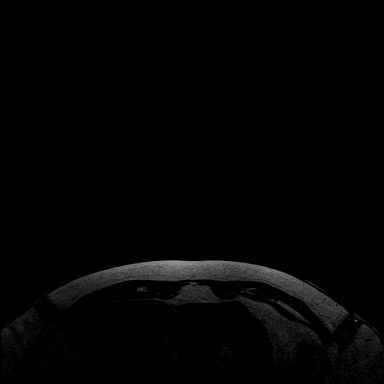
[im 40/160]
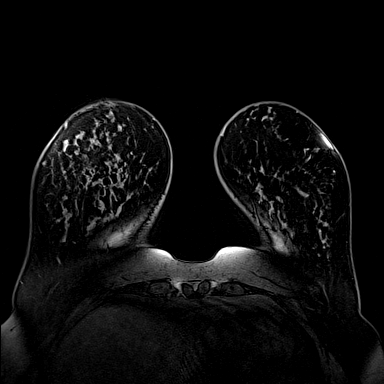
[im 80/160]
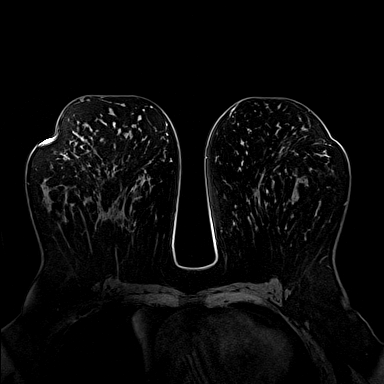
[im 120/160]
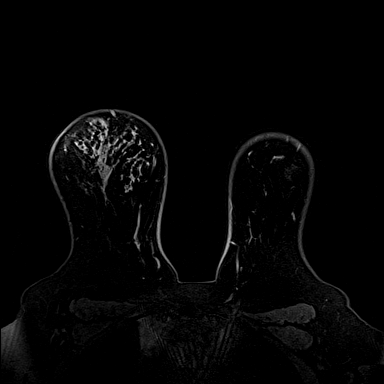
[im 160/160]
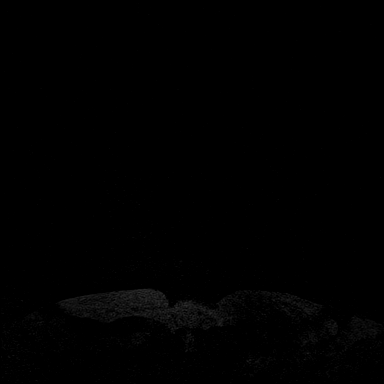

[Series 6: fl3d post-cm 20 · axial · 1.2mm · 0.99mm/px · z∈[-96,+95]mm · 5 of 160 slices shown (1 of 3)]
[im 1/160]
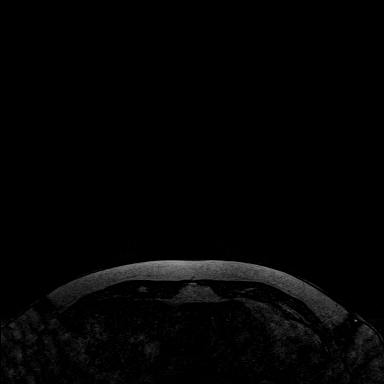
[im 40/160]
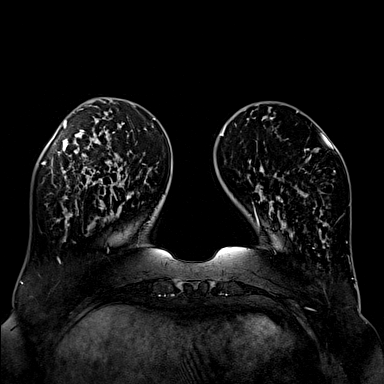
[im 80/160]
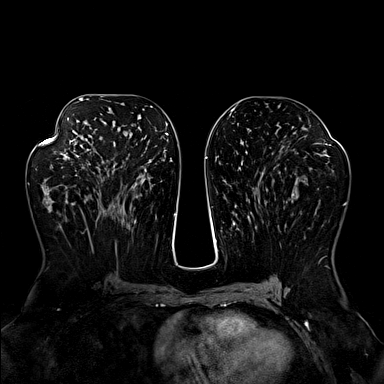
[im 120/160]
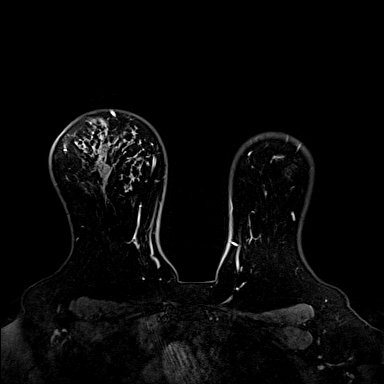
[im 160/160]
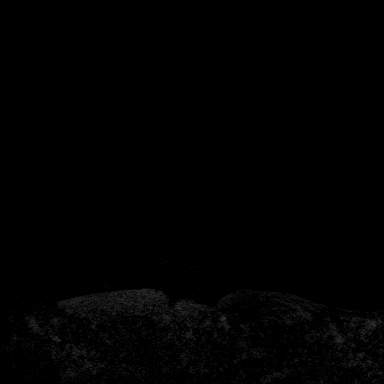

[Series 7: fl3d post-cm 20 · axial · 1.2mm · 0.99mm/px · z∈[-96,+95]mm · 6 of 160 slices shown (2 of 3)]
[im 1/160]
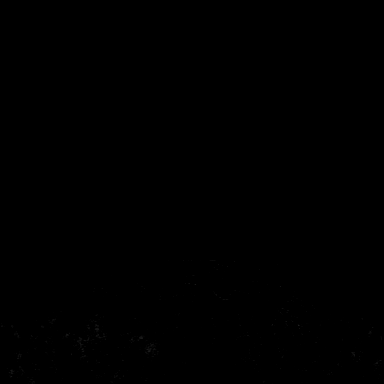
[im 32/160]
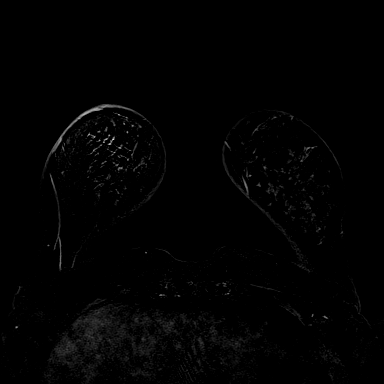
[im 64/160]
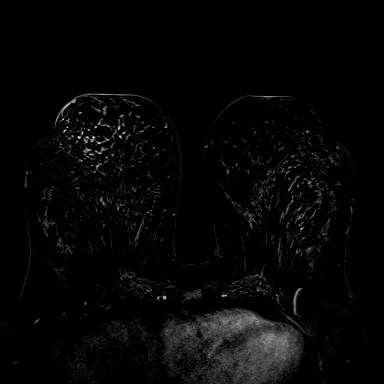
[im 96/160]
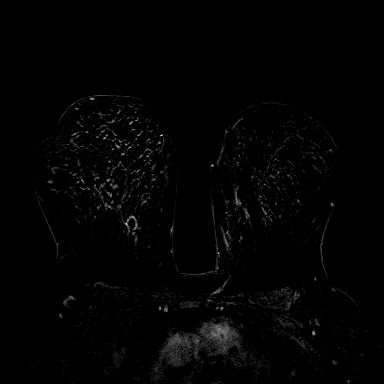
[im 128/160]
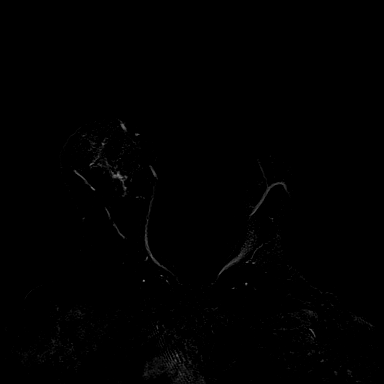
[im 160/160]
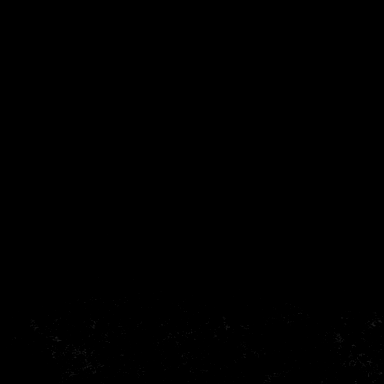

[Series 8: fl3d post-cm 20 · axial · 192.0mm · 0.99mm/px · 1 of 1 slices shown (3 of 3)]
[im 1/1]
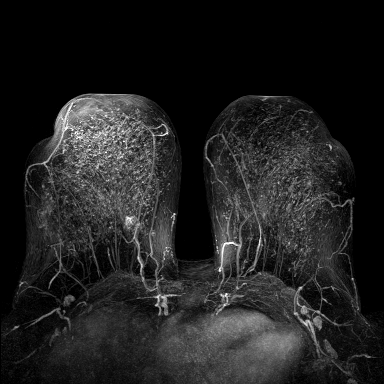

[Series 9: fl3d post-cm 3min · axial · 1.2mm · 0.99mm/px · z∈[-96,+95]mm · 5 of 159 slices shown]
[im 1/159]
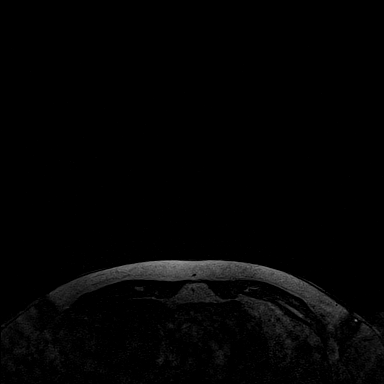
[im 40/159]
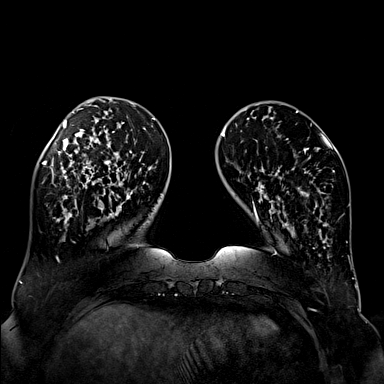
[im 80/159]
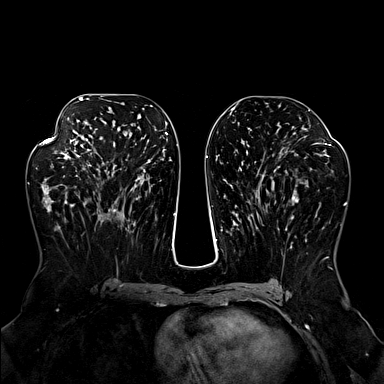
[im 119/159]
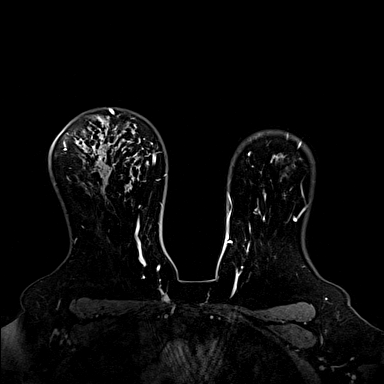
[im 159/159]
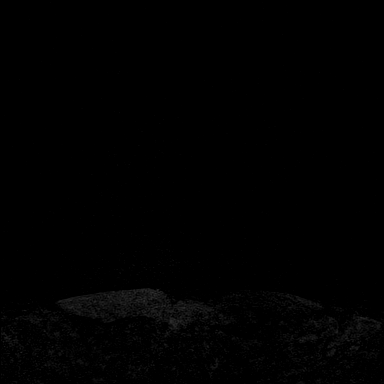

[Series 10: fl3d post-cm 3min_sub · axial · 1.2mm · 0.99mm/px · z∈[-96,+45]mm · 4 of 158 slices shown]
[im 1/158]
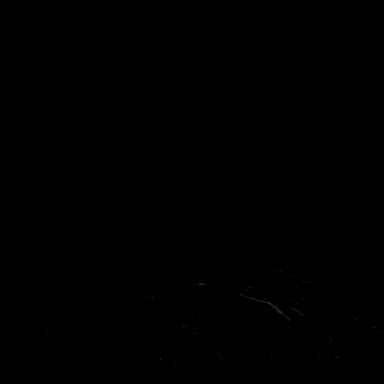
[im 40/158]
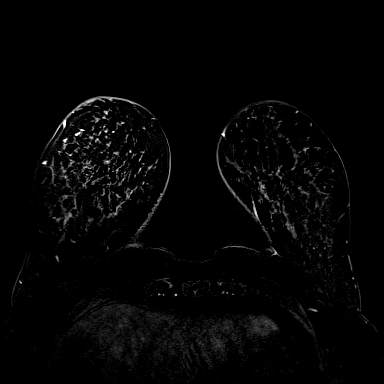
[im 79/158]
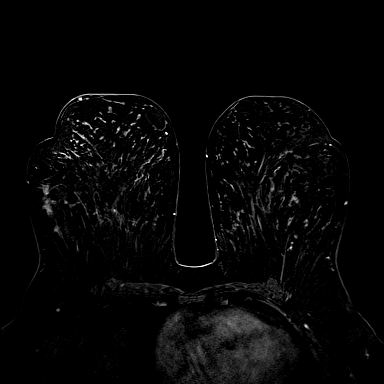
[im 118/158]
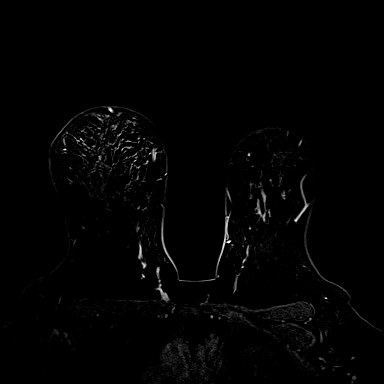

[33 of 48 positions shown; findings below may reference images not displayed]

THREE-DIMENSIONAL MR IMAGE RENDERING ON INDEPENDENT WORKSTATION:

Three-dimensional MR images were rendered by post-processing of the
original MR data on an independent workstation. The
three-dimensional MR images were interpreted, and findings are
reported in the following complete MRI report for this study. Three
dimensional images were evaluated at the independent [employer]
workstation
FINDINGS: Breast composition: c. Heterogeneous fibroglandular tissue.

Background parenchymal enhancement: Marked

Right breast: There is a post biopsy hematoma in the posterior upper
outer right breast measuring 19 x 13 x 25 mm. There is a surrounding
ring of reactive enhancement. Clip susceptibility artifact lies
along the posterior margin of the biopsy hematoma.

There are no masses or other areas of abnormal enhancement within
the right breast.

Left breast: No mass or abnormal enhancement.

Lymph nodes: No abnormal appearing lymph nodes.

Ancillary findings:  None.
IMPRESSION: 1. Newly diagnosed invasive lobular carcinoma of the posterior upper
inner right breast reflected by a post biopsy hematoma with
surrounding reactive enhancement.
2. No evidence of multifocal or multicentric disease. No mass or
abnormal enhancement in the left breast to suggest contralateral
disease.

RECOMMENDATION:
Treatment as planned for the known right breast carcinoma.

BI-RADS CATEGORY  6: Known biopsy-proven malignancy.

## 2018-04-04 ENCOUNTER — Ambulatory Visit
Admission: RE | Admit: 2018-04-04 | Discharge: 2018-04-04 | Disposition: A | Discharge status: Home / Self Care | Payer: Commercial Managed Care - PPO | Source: Ambulatory Visit | Attending: Adult Health | Admitting: Adult Health

## 2018-04-04 DIAGNOSIS — C50211 Malignant neoplasm of upper-inner quadrant of right female breast: Secondary | ICD-10-CM

## 2018-04-04 DIAGNOSIS — Z170 Estrogen receptor positive status [ER+]: Principal | ICD-10-CM

## 2018-04-04 HISTORY — DX: Personal history of irradiation: Z92.3

## 2018-04-04 HISTORY — DX: Personal history of antineoplastic chemotherapy: Z92.21

## 2018-06-12 ENCOUNTER — Other Ambulatory Visit: Payer: Self-pay | Admitting: *Deleted

## 2018-06-12 DIAGNOSIS — Z170 Estrogen receptor positive status [ER+]: Principal | ICD-10-CM

## 2018-06-12 DIAGNOSIS — C50211 Malignant neoplasm of upper-inner quadrant of right female breast: Secondary | ICD-10-CM

## 2018-06-13 ENCOUNTER — Telehealth: Payer: Self-pay | Admitting: Hematology and Oncology

## 2018-06-13 ENCOUNTER — Inpatient Hospital Stay: Payer: Commercial Managed Care - PPO | Attending: Hematology and Oncology

## 2018-06-13 ENCOUNTER — Inpatient Hospital Stay: Payer: Commercial Managed Care - PPO | Admitting: Hematology and Oncology

## 2018-06-13 DIAGNOSIS — Z79810 Long term (current) use of selective estrogen receptor modulators (SERMs): Secondary | ICD-10-CM

## 2018-06-13 DIAGNOSIS — Z170 Estrogen receptor positive status [ER+]: Secondary | ICD-10-CM | POA: Insufficient documentation

## 2018-06-13 DIAGNOSIS — C50211 Malignant neoplasm of upper-inner quadrant of right female breast: Secondary | ICD-10-CM | POA: Diagnosis not present

## 2018-06-13 LAB — CBC WITH DIFFERENTIAL (CANCER CENTER ONLY)
BASOS PCT: 1 %
Basophils Absolute: 0.1 10*3/uL (ref 0.0–0.1)
Eosinophils Absolute: 0.4 10*3/uL (ref 0.0–0.5)
Eosinophils Relative: 5 %
HEMATOCRIT: 38.6 % (ref 34.8–46.6)
HEMOGLOBIN: 12.7 g/dL (ref 11.6–15.9)
LYMPHS ABS: 3.2 10*3/uL (ref 0.9–3.3)
Lymphocytes Relative: 34 %
MCH: 28.5 pg (ref 25.1–34.0)
MCHC: 33 g/dL (ref 31.5–36.0)
MCV: 86.5 fL (ref 79.5–101.0)
MONOS PCT: 6 %
Monocytes Absolute: 0.5 10*3/uL (ref 0.1–0.9)
NEUTROS PCT: 54 %
Neutro Abs: 5 10*3/uL (ref 1.5–6.5)
Platelet Count: 202 10*3/uL (ref 145–400)
RBC: 4.47 MIL/uL (ref 3.70–5.45)
RDW: 12.8 % (ref 11.2–14.5)
WBC Count: 9.2 10*3/uL (ref 3.9–10.3)

## 2018-06-13 LAB — CMP (CANCER CENTER ONLY)
ALK PHOS: 69 U/L (ref 38–126)
ALT: 23 U/L (ref 0–44)
AST: 24 U/L (ref 15–41)
Albumin: 3.8 g/dL (ref 3.5–5.0)
Anion gap: 7 (ref 5–15)
BUN: 19 mg/dL (ref 6–20)
CALCIUM: 9.2 mg/dL (ref 8.9–10.3)
CHLORIDE: 110 mmol/L (ref 98–111)
CO2: 23 mmol/L (ref 22–32)
Creatinine: 1.06 mg/dL — ABNORMAL HIGH (ref 0.44–1.00)
GFR, EST NON AFRICAN AMERICAN: 60 mL/min — AB (ref >60)
Glucose, Bld: 143 mg/dL — ABNORMAL HIGH (ref 70–99)
Potassium: 4.1 mmol/L (ref 3.5–5.1)
Sodium: 140 mmol/L (ref 135–145)
Total Bilirubin: 0.4 mg/dL (ref 0.3–1.2)
Total Protein: 7.5 g/dL (ref 6.5–8.1)

## 2018-06-13 MED ORDER — LEVOTHYROXINE SODIUM 137 MCG PO TABS: 137.0000 ug | ORAL_TABLET | Freq: Every day | ORAL | Status: DC

## 2018-06-13 NOTE — Telephone Encounter (Signed)
Gave patient avs and calendar of upcoming appts.  °

## 2018-06-13 NOTE — Progress Notes (Signed)
Patient Care Team: [name], MD as PCP - General (Obstetrics and Gynecology) Delice Bison, Charlestine Massed, NP as Nurse Practitioner (Hematology and Oncology) Nicholas Lose, MD as Consulting Physician (Hematology and Oncology) Irene Limbo, MD as Consulting Physician (Plastic Surgery) Gery Pray, MD as Consulting Physician (Radiation Oncology) Rolm Bookbinder, MD as Consulting Physician (General Surgery)  DIAGNOSIS:  Encounter Diagnosis  Name Primary?  . Malignant neoplasm of upper-inner quadrant of right breast in female, estrogen receptor positive ([place])     SUMMARY OF ONCOLOGIC HISTORY:   Breast cancer of upper-inner quadrant of right female breast ([place])   01/21/2016 Initial Diagnosis    Right breast biopsy: Invasive lobular cancer, ER 95%, PR 95%, Ki-67 5%, HER-2 positive ratio 2.52      02/01/2016 Breast MRI    Right breast: Post biopsy hematoma measuring 1.9 x 1.3 x 2.5 cm surrounding ring of reactive enhancement, no lymphadenopathy. T2 N0 stage II a clinical stage      03/01/2016 Surgery    Right lumpectomy: ILC, grade 2, 0.8 cm, ALH, additional superior margin : ILC grade to 0.3 cm , positive superior margin , 0/1 LN neg, mammoplasty bil : benign, ER 95%, PR 95%, HER-2 pos, Ki-67 5%, T1bN0 stage IA      03/15/2016 Surgery    Right breast. Margin excision: Benign      04/20/2016 - 04/17/2017 Chemotherapy    Taxol Herceptin weekly 12 followed by Herceptin maintenance for 1 year      08/10/2016 - 09/16/2016 Radiation Therapy    Adjuvant radiation therapy      11/14/2016 -  Anti-estrogen oral therapy    Tamoxifen 20 mg daily       CHIEF COMPLIANT: Follow-up on tamoxifen therapy  INTERVAL HISTORY: Janet King is a 52 year old with above-mentioned history of right breast cancer treated with lumpectomy adjuvant chemotherapy and radiation is currently on tamoxifen.  She appears to be tolerating extremely well.  She does not have any side effects to  tamoxifen.  She stays very busy without any problems or concerns.  REVIEW OF SYSTEMS:   Constitutional: Denies fevers, chills or abnormal weight loss Eyes: Denies blurriness of vision Ears, nose, mouth, throat, and face: Denies mucositis or sore throat Respiratory: Denies cough, dyspnea or wheezes Cardiovascular: Denies palpitation, chest discomfort Gastrointestinal:  Denies nausea, heartburn or change in bowel habits Skin: Denies abnormal skin rashes Lymphatics: Denies new lymphadenopathy or easy bruising Neurological:Denies numbness, tingling or new weaknesses Behavioral/Psych: Mood is stable, no new changes  Extremities: No lower extremity edema Breast:  denies any pain or lumps or nodules in either breasts All other systems were reviewed with the patient and are negative.  I have reviewed the past medical history, past surgical history, social history and family history with the patient and they are unchanged from previous note.  ALLERGIES:  is allergic to adhesive [tape] and penicillin g.  MEDICATIONS:  Current Outpatient Medications  Medication Sig Dispense Refill  . levothyroxine (SYNTHROID, LEVOTHROID) 125 MCG tablet Take 125 mcg by mouth daily before breakfast.    . tamoxifen (NOLVADEX) 20 MG tablet TAKE 1 TABLET(20 MG) BY MOUTH DAILY 90 tablet 4  . Vitamin D, Ergocalciferol, (DRISDOL) 50000 units CAPS capsule TK ONE C PO TWICE A WEEK  4   No current facility-administered medications for this visit.     PHYSICAL EXAMINATION: ECOG PERFORMANCE STATUS: 0 - Asymptomatic  Vitals:   06/13/18 1437  BP: 134/82  Pulse: (!) 103  Resp: 20  Temp: 98.8 F (  37.1 C)  SpO2: 100%   Filed Weights   06/13/18 1437  Weight: 258 lb 12.8 oz (117.4 kg)    GENERAL:alert, no distress and comfortable SKIN: skin color, texture, turgor are normal, no rashes or significant lesions EYES: normal, Conjunctiva are pink and non-injected, sclera clear OROPHARYNX:no exudate, no erythema and  lips, buccal mucosa, and tongue normal  NECK: supple, thyroid normal size, non-tender, without nodularity LYMPH:  no palpable lymphadenopathy in the cervical, axillary or inguinal LUNGS: clear to auscultation and percussion with normal breathing effort HEART: regular rate & rhythm and no murmurs and no lower extremity edema ABDOMEN:abdomen soft, non-tender and normal bowel sounds MUSCULOSKELETAL:no cyanosis of digits and no clubbing  NEURO: alert & oriented x 3 with fluent speech, no focal motor/sensory deficits EXTREMITIES: No lower extremity edema BREAST: No palpable masses or nodules in either right or left breasts. No palpable axillary supraclavicular or infraclavicular adenopathy no breast tenderness or nipple discharge. (exam performed in the presence of a chaperone)  LABORATORY DATA:  I have reviewed the data as listed CMP Latest Ref Rng & Units 06/13/2018 04/17/2017 03/06/2017  Glucose 70 - 99 mg/dL 143(H) 138 92  BUN 6 - 20 mg/dL 19 13.7 16.5  Creatinine 0.44 - 1.00 mg/dL 1.06(H) 0.9 0.8  Sodium 135 - 145 mmol/L 140 143 142  Potassium 3.5 - 5.1 mmol/L 4.1 4.0 4.0  Chloride 98 - 111 mmol/L 110 - -  CO2 22 - 32 mmol/L '23 25 25  ' Calcium 8.9 - 10.3 mg/dL 9.2 9.0 8.9  Total Protein 6.5 - 8.1 g/dL 7.5 7.1 7.0  Total Bilirubin 0.3 - 1.2 mg/dL 0.4 0.48 0.26  Alkaline Phos 38 - 126 U/L 69 80 69  AST 15 - 41 U/L '24 13 15  ' ALT 0 - 44 U/L '23 15 13    ' Lab Results  Component Value Date   WBC 9.2 06/13/2018   HGB 12.7 06/13/2018   HCT 38.6 06/13/2018   MCV 86.5 06/13/2018   PLT 202 06/13/2018   NEUTROABS 5.0 06/13/2018    ASSESSMENT & PLAN:  Breast cancer of upper-inner quadrant of right female breast ([place]) Right lumpectomy 03/01/2016: ILC, grade 2, 0.8 cm, ALH, additional superior margin : ILC grade to 0.3 cm , positive superior margin , 0/1 LN neg, mammoplasty bil : benign, ER 95%, PR 95%, HER-2 positive on primary tumor and negative on secondary mass which had a ratio of 1.55, gene  copy number is 2.25, Ki-67 5%, T1bN0 stage IA  HER-2: HER-2 was positive in the original tumor and the HER-2 on the second excision was negative. I was able to get this clarification from pathology.  Reexcision of superior margin: 5 2017: Benign  Treatment plan: 1. Adjuvant therapy with Taxol and Herceptin weekly 12 completed 07/06/2016 2. Followed by Herceptin maintenance every 3 weeks 1 year (was on hold 06/29/2016 to 08/08/2016 due to a decrease in ejection fraction) completed June 2018 2. Followedadjuvant radiation 08/10/2016 to 09/16/2016 3. Antiestrogen therapy With tamoxifen to start January 2018 (patient's last menstrual cycle was June 2017) ----------------------------------------------------------------------------------------------------------------------------- Current treatment: Tamoxifen 20 mg daily started January 2018  Tamoxifen Toxicities: No further problems with hot flashes or myalgias.  Breast cancer surveillance: 1.  Mammogram 04/02/2018: No evidence of malignancy breast density category C 2. breast exam 06/13/2018: Benign  Return to clinic in 1 year for follow-up   No orders of the defined types were placed in this encounter.  The patient has a good understanding of the overall plan.  she agrees with it. she will call with any problems that may develop before the next visit here.   Harriette Ohara, MD 06/13/18

## 2018-06-13 NOTE — Assessment & Plan Note (Signed)
Right lumpectomy 03/01/2016: ILC, grade 2, 0.8 cm, ALH, additional superior margin : ILC grade to 0.3 cm , positive superior margin , 0/1 LN neg, mammoplasty bil : benign, ER 95%, PR 95%, HER-2 positive on primary tumor and negative on secondary mass which had a ratio of 1.55, gene copy number is 2.25, Ki-67 5%, T1bN0 stage IA  HER-2: HER-2 was positive in the original tumor and the HER-2 on the second excision was negative. I was able to get this clarification from pathology.  Reexcision of superior margin: 5 2017: Benign  Treatment plan: 1. Adjuvant therapy with Taxol and Herceptin weekly 12 completed 07/06/2016 2. Followed by Herceptin maintenance every 3 weeks 1 year (was on hold 06/29/2016 to 08/08/2016 due to a decrease in ejection fraction) completed June 2018 2. Followedadjuvant radiation 08/10/2016 to 09/16/2016 3. Antiestrogen therapy With tamoxifen to start January 2018 (patient's last menstrual cycle was June 2017) ----------------------------------------------------------------------------------------------------------------------------- Current treatment: Tamoxifen 20 mg daily started January 2018  Tamoxifen Toxicities: Mild hot flashes which keep her awake at night  Breast cancer surveillance: 1.  Mammogram 04/02/2018: No evidence of malignancy breast density category C 2. breast exam 06/13/2018: Benign  Return to clinic in 1 year for follow-up

## 2018-07-26 ENCOUNTER — Other Ambulatory Visit: Payer: Self-pay | Admitting: Nurse Practitioner

## 2018-08-16 ENCOUNTER — Other Ambulatory Visit: Payer: Self-pay | Admitting: Nurse Practitioner

## 2018-11-25 ENCOUNTER — Other Ambulatory Visit: Payer: Self-pay | Admitting: Hematology and Oncology

## 2019-02-23 ENCOUNTER — Other Ambulatory Visit: Payer: Self-pay | Admitting: Hematology and Oncology

## 2019-03-07 ENCOUNTER — Other Ambulatory Visit: Payer: Self-pay | Admitting: Adult Health

## 2019-03-07 DIAGNOSIS — Z853 Personal history of malignant neoplasm of breast: Secondary | ICD-10-CM

## 2019-04-09 ENCOUNTER — Other Ambulatory Visit: Payer: Self-pay

## 2019-04-09 ENCOUNTER — Ambulatory Visit
Admission: RE | Admit: 2019-04-09 | Discharge: 2019-04-09 | Disposition: A | Discharge status: Home / Self Care | Payer: Commercial Managed Care - PPO | Source: Ambulatory Visit | Attending: Adult Health | Admitting: Adult Health

## 2019-04-09 DIAGNOSIS — Z853 Personal history of malignant neoplasm of breast: Secondary | ICD-10-CM

## 2019-05-13 ENCOUNTER — Telehealth: Payer: Self-pay | Admitting: Hematology and Oncology

## 2019-05-13 NOTE — Telephone Encounter (Signed)
R/s appt per 6/26 sch message- pt aware of new appt date and time

## 2019-05-20 ENCOUNTER — Other Ambulatory Visit: Payer: Self-pay | Admitting: Hematology and Oncology

## 2019-06-12 ENCOUNTER — Ambulatory Visit: Payer: Commercial Managed Care - PPO | Admitting: Hematology and Oncology

## 2019-08-17 ENCOUNTER — Other Ambulatory Visit: Payer: Self-pay | Admitting: Hematology and Oncology

## 2019-10-23 NOTE — Progress Notes (Signed)
Patient Care Team: [name], MD as PCP - General (Obstetrics and Gynecology) Delice Bison, Charlestine Massed, NP as Nurse Practitioner (Hematology and Oncology) Nicholas Lose, MD as Consulting Physician (Hematology and Oncology) Irene Limbo, MD as Consulting Physician (Plastic Surgery) Gery Pray, MD as Consulting Physician (Radiation Oncology) Rolm Bookbinder, MD as Consulting Physician (General Surgery)  DIAGNOSIS:    ICD-10-CM   1. Malignant neoplasm of upper-inner quadrant of right breast in female, estrogen receptor positive ([name])  C50.211    Z17.0     SUMMARY OF ONCOLOGIC HISTORY: Oncology History  Breast cancer of upper-inner quadrant of right female breast (Janet King)  01/21/2016 Initial Diagnosis   Right breast biopsy: Invasive lobular cancer, ER 95%, PR 95%, Ki-67 5%, HER-2 positive ratio 2.52   02/01/2016 Breast MRI   Right breast: Post biopsy hematoma measuring 1.9 x 1.3 x 2.5 cm surrounding ring of reactive enhancement, no lymphadenopathy. T2 N0 stage II a clinical stage   03/01/2016 Surgery   Right lumpectomy: ILC, grade 2, 0.8 cm, ALH, additional superior margin : ILC grade to 0.3 cm , positive superior margin , 0/1 LN neg, mammoplasty bil : benign, ER 95%, PR 95%, HER-2 pos, Ki-67 5%, T1bN0 stage IA   03/15/2016 Surgery   Right breast. Margin excision: Benign   04/20/2016 - 04/17/2017 Chemotherapy   Taxol Herceptin weekly 12 followed by Herceptin maintenance for 1 year   08/10/2016 - 09/16/2016 Radiation Therapy   Adjuvant radiation therapy   11/14/2016 -  Anti-estrogen oral therapy   Tamoxifen 20 mg daily     CHIEF COMPLIANT: Follow-up on tamoxifen therapy  INTERVAL HISTORY: Janet King is a 53 y.o. with above-mentioned history of right breast cancer treated with lumpectomy, adjuvant chemotherapy, radiation, and who is currently on tamoxifen. Mammogram on 04/09/19 showed no evidence of malignancy bilaterally. She presents to the clinic today for  annual follow-up.  Apart from occasional tenderness in the breast, she is doing quite well.  Hot flashes have resolved and she is tolerating tamoxifen extremely well.  REVIEW OF SYSTEMS:   Constitutional: Denies fevers, chills or abnormal weight loss Eyes: Denies blurriness of vision Ears, nose, mouth, throat, and face: Denies mucositis or sore throat Respiratory: Denies cough, dyspnea or wheezes Cardiovascular: Denies palpitation, chest discomfort Gastrointestinal: Denies nausea, heartburn or change in bowel habits Skin: Denies abnormal skin rashes Lymphatics: Denies new lymphadenopathy or easy bruising Neurological: Denies numbness, tingling or new weaknesses Behavioral/Psych: Mood is stable, no new changes  Extremities: No lower extremity edema Breast: denies any pain or lumps or nodules in either breasts All other systems were reviewed with the patient and are negative.  I have reviewed the past medical history, past surgical history, social history and family history with the patient and they are unchanged from previous note.  ALLERGIES:  is allergic to adhesive [tape] and penicillin g.  MEDICATIONS:  Current Outpatient Medications  Medication Sig Dispense Refill   levothyroxine (SYNTHROID, LEVOTHROID) 137 MCG tablet Take 1 tablet (137 mcg total) by mouth daily before breakfast.     tamoxifen (NOLVADEX) 20 MG tablet Take 1 tablet (20 mg total) by mouth daily. 90 tablet 3   Vitamin D, Ergocalciferol, (DRISDOL) 50000 units CAPS capsule TK ONE C PO TWICE A WEEK  4   No current facility-administered medications for this visit.    PHYSICAL EXAMINATION: ECOG PERFORMANCE STATUS: 1 - Symptomatic but completely ambulatory  Vitals:   10/24/19 1000  BP: (!) 134/97  Pulse: (!) 115  Resp: 19  Temp:  98.2 F (36.8 C)  SpO2: 98%   Filed Weights   10/24/19 1000  Weight: 255 lb 11.2 oz (116 kg)    GENERAL: alert, no distress and comfortable SKIN: skin color, texture, turgor are  normal, no rashes or significant lesions EYES: normal, Conjunctiva are pink and non-injected, sclera clear OROPHARYNX: no exudate, no erythema and lips, buccal mucosa, and tongue normal  NECK: supple, thyroid normal size, non-tender, without nodularity LYMPH: no palpable lymphadenopathy in the cervical, axillary or inguinal LUNGS: clear to auscultation and percussion with normal breathing effort HEART: regular rate & rhythm and no murmurs and no lower extremity edema ABDOMEN: abdomen soft, non-tender and normal bowel sounds MUSCULOSKELETAL: no cyanosis of digits and no clubbing  NEURO: alert & oriented x 3 with fluent speech, no focal motor/sensory deficits EXTREMITIES: No lower extremity edema BREAST: No palpable masses or nodules in either right or left breasts. No palpable axillary supraclavicular or infraclavicular adenopathy no breast tenderness or nipple discharge. (exam performed in the presence of a chaperone)  LABORATORY DATA:  I have reviewed the data as listed CMP Latest Ref Rng & Units 06/13/2018 04/17/2017 03/06/2017  Glucose 70 - 99 mg/dL 143(H) 138 92  BUN 6 - 20 mg/dL 19 13.7 16.5  Creatinine 0.44 - 1.00 mg/dL 1.06(H) 0.9 0.8  Sodium 135 - 145 mmol/L 140 143 142  Potassium 3.5 - 5.1 mmol/L 4.1 4.0 4.0  Chloride 98 - 111 mmol/L 110 - -  CO2 22 - 32 mmol/L '23 25 25  ' Calcium 8.9 - 10.3 mg/dL 9.2 9.0 8.9  Total Protein 6.5 - 8.1 g/dL 7.5 7.1 7.0  Total Bilirubin 0.3 - 1.2 mg/dL 0.4 0.48 0.26  Alkaline Phos 38 - 126 U/L 69 80 69  AST 15 - 41 U/L '24 13 15  ' ALT 0 - 44 U/L '23 15 13    ' Lab Results  Component Value Date   WBC 9.2 06/13/2018   HGB 12.7 06/13/2018   HCT 38.6 06/13/2018   MCV 86.5 06/13/2018   PLT 202 06/13/2018   NEUTROABS 5.0 06/13/2018    ASSESSMENT & PLAN:  Breast cancer of upper-inner quadrant of right female breast (Janet King) Right lumpectomy 03/01/2016: ILC, grade 2, 0.8 cm, ALH, additional superior margin : ILC grade to 0.3 cm , positive superior margin ,  0/1 LN neg, mammoplasty bil : benign, ER 95%, PR 95%, HER-2 positive on primary tumor and negative on secondary mass which had a ratio of 1.55, gene copy number is 2.25, Ki-67 5%, T1bN0 stage IA  HER-2: HER-2 was positive in the original tumor and the HER-2 on the second excision was negative. I was able to get this clarification from pathology.  Reexcision of superior margin: 5 2017: Benign  Treatment plan: 1. Adjuvant therapy with Taxol and Herceptin weekly 12 completed 07/06/2016 2. Followed by Herceptin maintenance every 3 weeks 1 year (was on hold 06/29/2016 to 08/08/2016 due to a decrease in ejection fraction) completed June 2018 2. Followedadjuvant radiation 08/10/2016 to 09/16/2016 3. Antiestrogen therapy With tamoxifen to start January 2018 (patient's last menstrual cycle was June 2017) ----------------------------------------------------------------------------------------------------------------------------- Current treatment: Tamoxifen 20 mg daily started January 2018  Tamoxifen Toxicities: No further problems with hot flashes or myalgias.  Breast cancer surveillance: 1.  Mammogram 04/09/2019: No evidence of malignancy breast density category C 2. breast exam 10/24/19: Benign  She has been busy doing event management.  Currently everything is online and she has been working from home.  Return to clinic in 1 year for  follow-up    No orders of the defined types were placed in this encounter.  The patient has a good understanding of the overall plan. she agrees with it. she will call with any problems that may develop before the next visit here.  Nicholas Lose, MD 10/24/2019  Julious Oka Dorshimer, am acting as scribe for Dr. Nicholas Lose.  I have reviewed the above documentation for accuracy and completeness, and I agree with the above.

## 2019-10-24 ENCOUNTER — Telehealth: Payer: Self-pay | Admitting: Hematology and Oncology

## 2019-10-24 ENCOUNTER — Inpatient Hospital Stay: Payer: Commercial Managed Care - PPO | Attending: Hematology and Oncology | Admitting: Hematology and Oncology

## 2019-10-24 ENCOUNTER — Other Ambulatory Visit: Payer: Self-pay

## 2019-10-24 DIAGNOSIS — Z79810 Long term (current) use of selective estrogen receptor modulators (SERMs): Secondary | ICD-10-CM | POA: Diagnosis not present

## 2019-10-24 DIAGNOSIS — C50211 Malignant neoplasm of upper-inner quadrant of right female breast: Secondary | ICD-10-CM | POA: Insufficient documentation

## 2019-10-24 DIAGNOSIS — Z170 Estrogen receptor positive status [ER+]: Secondary | ICD-10-CM | POA: Insufficient documentation

## 2019-10-24 MED ORDER — TAMOXIFEN CITRATE 20 MG PO TABS: 20.0000 mg | ORAL_TABLET | Freq: Every day | ORAL | 3 refills | Status: DC

## 2019-10-24 NOTE — Assessment & Plan Note (Signed)
Right lumpectomy 03/01/2016: ILC, grade 2, 0.8 cm, ALH, additional superior margin : ILC grade to 0.3 cm , positive superior margin , 0/1 LN neg, mammoplasty bil : benign, ER 95%, PR 95%, HER-2 positive on primary tumor and negative on secondary mass which had a ratio of 1.55, gene copy number is 2.25, Ki-67 5%, T1bN0 stage IA  HER-2: HER-2 was positive in the original tumor and the HER-2 on the second excision was negative. I was able to get this clarification from pathology.  Reexcision of superior margin: 5 2017: Benign  Treatment plan: 1. Adjuvant therapy with Taxol and Herceptin weekly 12 completed 07/06/2016 2. Followed by Herceptin maintenance every 3 weeks 1 year (was on hold 06/29/2016 to 08/08/2016 due to a decrease in ejection fraction) completed June 2018 2. Followedadjuvant radiation 08/10/2016 to 09/16/2016 3. Antiestrogen therapy With tamoxifen to start January 2018 (patient's last menstrual cycle was June 2017) ----------------------------------------------------------------------------------------------------------------------------- Current treatment: Tamoxifen 20 mg daily started January 2018  Tamoxifen Toxicities: No further problems with hot flashes or myalgias.  Breast cancer surveillance: 1.  Mammogram 04/09/2019: No evidence of malignancy breast density category C 2. breast exam 10/24/19: Benign  Return to clinic in 1 year for follow-up

## 2019-10-24 NOTE — Telephone Encounter (Signed)
I talk with patient regarding schedule  

## 2020-02-07 ENCOUNTER — Telehealth: Payer: Self-pay | Admitting: General Practice

## 2020-02-07 NOTE — Telephone Encounter (Signed)
   Pt called to check if we receive her referral

## 2020-02-08 ENCOUNTER — Ambulatory Visit: Payer: Commercial Managed Care - PPO | Attending: Internal Medicine

## 2020-02-08 DIAGNOSIS — Z23 Encounter for immunization: Secondary | ICD-10-CM

## 2020-02-08 NOTE — Progress Notes (Signed)
Janet King presented for her first dose of her COVID vaccine and received it at 0950. During her planned 15 minute observation period, she called the RN over.  1000 Reported she felt like she was going to pass out. RN stayed with her while BP monitor was brought over. Pt was flushed; RR 22, P 122, 157/103, 95% on RA. She reported that her normal BP is 120/80 w HR in 90s-100; denies hx HTN or DM; does report hx breast ca.   [place] reported she was feeling better; moved to EMT area for continued monitoring  1009 Pt was flushed again & reported she felt "another wave" of feeling like she was going to pass out. 158/91, P 117, 98% on RA, RR22. Janet King has not had anything to eat or drink today (this is normal for her/does not eat breakfast). Provided a bottle of water and crackers & encouraged her to finish both.  [name] reported she felt better; encouraged to keep drinking/eating (<1/2 bottle gone, only ate 2 crackers). She asked to stand. BP when standing immediately dropped to 96/50. After 3 minutes, it rebounded to 141/91. Instructed her to sit back down and spoke to NP Mayah; she instructed me to get modified orthostatics (sitting, standing, walking).  1026 Standing: 139/79 immediately after standing, P 123, 100% RA. Janet King then walked about 50': 129/74, P 120, 100% on RA.  1032 Discussed these readings with Janet King. She feels back to baseline, and her BP is back to her normal. She would like to leave and has no further questions. She was encouraged to eat lunch when she left for home, and she left the hall on foot.  [name] [name], RN, BSN, [facility]  02/08/2020 11:12 AM

## 2020-03-04 ENCOUNTER — Ambulatory Visit: Payer: Commercial Managed Care - PPO | Attending: Internal Medicine

## 2020-03-04 DIAGNOSIS — Z23 Encounter for immunization: Secondary | ICD-10-CM

## 2020-03-04 NOTE — Progress Notes (Signed)
   Covid-19 Vaccination Clinic  Name:  Janet King    MRN: [id number] DOB: [birth year]  03/04/2020  [name] was observed post Covid-19 immunization for 15 minutes without incident. She was provided with Vaccine Information Sheet and instruction to access the V-Safe system.   [name] was instructed to call 911 with any severe reactions post vaccine: Marland Kitchen Difficulty breathing  . Swelling of face and throat  . A fast heartbeat  . A bad rash all over body  . Dizziness and weakness   Immunizations Administered    Name Date Dose VIS Date Route   Pfizer COVID-19 Vaccine 03/04/2020  8:48 AM 0.3 mL 01/08/2019 Intramuscular   Manufacturer: [place]   Lot: [id number]   [place]: [id number]

## 2020-03-08 NOTE — Progress Notes (Signed)
Cardiology Office Note:    Date:  03/09/2020   ID:  Janet King, DOB [id number], MRN [id number]  PCP:  [name], MD  Cardiologist:  No primary care provider on file.  Electrophysiologist:  None   Referring MD: Jacelyn Pi, MD   Chief Complaint  Patient presents with  . Tachycardia    History of Present Illness:    Janet King is a 54 y.o. female with a hx of breast cancer, hypothyroidism who is referred by Dr. Chalmers Cater for evaluation of tachycardia.  She was noted to have elevated heart rate at her clinic visit with Dr. Chalmers Cater.  Reports that her heart rate is always elevated.  Denies any palpitations, lower extremity edema, chest pain, or dyspnea.  Recently has been feeling lightheaded since he got her second COVID-19 vaccine last week.  Reports that she walks anywhere from 1-5 times per week for anywhere from 10 to 45 minutes.  She denies any exertional symptoms.  Most recent TTE in 2018 showed LVEF 55 to 60%, no significant valvular disease.  Past Medical History:  Diagnosis Date  . Baker's cyst of knee   . Breast cancer ([place])    right  . H/O bone density study   . History of radiation therapy 08/10/16-09/16/16   right breast 50.4 Gy  . Hypothyroidism   . Personal history of chemotherapy   . Personal history of radiation therapy     Past Surgical History:  Procedure Laterality Date  . BREAST BIOPSY    . BREAST LUMPECTOMY     invasive carcinoma right april 2017  . BREAST RECONSTRUCTION Bilateral 03/01/2016   Procedure: RIGHT ONCOPLASTIC BREAST RECONSTRUCTION WITH LEFT REDUCTION FOR SYMMETRY;  Surgeon: Irene Limbo, MD;  Location: [place];  Service: Plastics;  Laterality: Bilateral;  . EXCISION OF BREAST LESION Right 05/09/2017   Procedure: EXCISION OF BREAST LESION;  Surgeon: Irene Limbo, MD;  Location: [place];  Service: Plastics;  Laterality: Right;  . PERIPHERAL VASCULAR CATHETERIZATION Right 03/01/2016    Procedure: PORTA CATH INSERTION;  Surgeon: Rolm Bookbinder, MD;  Location: [place];  Service: General;  Laterality: Right;  . PORT-A-CATH REMOVAL Right 05/09/2017   Procedure: REMOVAL PORT-A-CATH AND EXCISION OF ULCER RIGHT BREAST;  Surgeon: Irene Limbo, MD;  Location: [place];  Service: Plastics;  Laterality: Right;  . RADIOACTIVE SEED GUIDED PARTIAL MASTECTOMY WITH AXILLARY SENTINEL LYMPH NODE BIOPSY Right 03/01/2016   Procedure: RADIOACTIVE SEED GUIDED PARTIAL MASTECTOMY WITH AXILLARY SENTINEL LYMPH NODE BIOPSY;  Surgeon: Rolm Bookbinder, MD;  Location: [place];  Service: General;  Laterality: Right;  . RE-EXCISION OF BREAST CANCER,SUPERIOR MARGINS Right 03/15/2016   Procedure: RE-EXCISION OF BREAST CANCER, SUPERIOR MARGINS;  Surgeon: Rolm Bookbinder, MD;  Location: [place];  Service: General;  Laterality: Right;  . REDUCTION MAMMAPLASTY     bilateral 2017  . TONSILLECTOMY      Current Medications: Current Meds  Medication Sig  . levothyroxine (SYNTHROID, LEVOTHROID) 137 MCG tablet Take 1 tablet (137 mcg total) by mouth daily before breakfast.  . tamoxifen (NOLVADEX) 20 MG tablet Take 1 tablet (20 mg total) by mouth daily.  . Vitamin D, Ergocalciferol, (DRISDOL) 50000 units CAPS capsule TK ONE C PO TWICE A WEEK     Allergies:   Adhesive [tape] and Penicillin g   Social History   Socioeconomic History  . Marital status: Single    Spouse name: Not on file  . Number of  children: 0  . Years of education: Not on file  . Highest education level: Not on file  Occupational History  . Occupation: Education officer, museum  Tobacco Use  . Smoking status: Never Smoker  . Smokeless tobacco: Never Used  Substance and Sexual Activity  . Alcohol use: Yes    Alcohol/week: 0.0 standard drinks    Comment: occ - once every 3 mos  . Drug use: No  . Sexual activity: Not Currently  Other Topics Concern  .  Not on file  Social History Narrative  . Not on file   Social Determinants of Health   Financial Resource Strain:   . Difficulty of Paying Living Expenses:   Food Insecurity:   . Worried About Charity fundraiser in the Last Year:   . Arboriculturist in the Last Year:   Transportation Needs:   . Film/video editor (Medical):   Marland Kitchen Lack of Transportation (Non-Medical):   Physical Activity:   . Days of Exercise per Week:   . Minutes of Exercise per Session:   Stress:   . Feeling of Stress :   Social Connections:   . Frequency of Communication with Friends and Family:   . Frequency of Social Gatherings with Friends and Family:   . Attends Religious Services:   . Active Member of Clubs or Organizations:   . Attends Archivist Meetings:   Marland Kitchen Marital Status:      Family History: The patient's family history includes Breast cancer in her sister and another family member; Breast cancer (age of onset: 36) in her mother; Cancer in some other family members; Colon cancer in her paternal grandmother; Heart disease in her maternal grandfather.  ROS:   Please see the history of present illness.     All other systems reviewed and are negative.  EKGs/Labs/Other Studies Reviewed:    The following studies were reviewed today:   EKG:  EKG is  ordered today.  The ekg ordered today demonstrates sinus tachycardia, rate 106, no ST/T abnormalities  Recent Labs: No results found for requested labs within last 8760 hours.  Recent Lipid Panel No results found for: CHOL, TRIG, HDL, CHOLHDL, VLDL, LDLCALC, LDLDIRECT  Physical Exam:    VS:  BP (!) 144/92   Pulse (!) 106   Ht 5\' 7"  (1.702 m)   Wt 247 lb (112 kg)   LMP 04/20/2016   BMI 38.69 kg/m     Wt Readings from Last 3 Encounters:  03/09/20 247 lb (112 kg)  10/24/19 255 lb 11.2 oz (116 kg)  06/13/18 258 lb 12.8 oz (117.4 kg)     GEN:  Well nourished, well developed in no acute distress HEENT: Normal NECK: No  JVD LYMPHATICS: No lymphadenopathy CARDIAC: RRR, no murmurs, rubs, gallops RESPIRATORY:  Clear to auscultation without rales, wheezing or rhonchi  ABDOMEN: Soft, non-tender, non-distended MUSCULOSKELETAL:  No edema; No deformity  SKIN: Warm and dry NEUROLOGIC:  Alert and oriented x 3 PSYCHIATRIC:  Normal affect   ASSESSMENT:    1. Tachycardia    PLAN:    In order of problems listed above:  Tachycardia:  Unclear cause, will check Zio patch x3 days to evaluate for arrhythmia.  TTE in 2018 showed no structural heart disease.  RTC in 2 months  Medication Adjustments/Labs and Tests Ordered: Current medicines are reviewed at length with the patient today.  Concerns regarding medicines are outlined above.  Orders Placed This Encounter  Procedures  .  LONG TERM MONITOR (3-14 DAYS)  . EKG 12-Lead   No orders of the defined types were placed in this encounter.   Patient Instructions  Medication Instructions:  Your physician recommends that you continue on your current medications as directed. Please refer to the Current Medication list given to you today.  Testing/Procedures:  Bryn Gulling- Long Term Monitor Instructions   Your physician has requested you wear your ZIO patch monitor for 3 days.   This is a single patch monitor.  [employer] supplies one patch monitor per enrollment.  Additional stickers are not available.   Please do not apply patch if you will be having a Nuclear Stress Test, Echocardiogram, Cardiac CT, MRI, or Chest Xray during the time frame you would be wearing the monitor. The patch cannot be worn during these tests.  You cannot remove and re-apply the ZIO XT patch monitor.   Your ZIO patch monitor will be sent USPS Priority mail from [facility] directly to your home address. The monitor may also be mailed to a PO BOX if home delivery is not available.   It may take 3-5 days to receive your monitor after you have been enrolled.   Once you have received you  monitor, please review enclosed instructions.  Your monitor has already been registered assigning a specific monitor serial # to you.   Applying the monitor   Shave hair from upper left chest.   Hold abrader disc by orange tab.  Rub abrader in 40 strokes over left upper chest as indicated in your monitor instructions.   Clean area with 4 enclosed alcohol pads .  Use all pads to assure are is cleaned thoroughly.  Let dry.   Apply patch as indicated in monitor instructions.  Patch will be place under collarbone on left side of chest with arrow pointing upward.   Rub patch adhesive wings for 2 minutes.Remove white label marked "1".  Remove white label marked "2".  Rub patch adhesive wings for 2 additional minutes.   While looking in a mirror, press and release button in center of patch.  A small green light will flash 3-4 times .  This will be your only indicator the monitor has been turned on.     Do not shower for the first 24 hours.  You may shower after the first 24 hours.   Press button if you feel a symptom. You will hear a small click.  Record Date, Time and Symptom in the Patient Log Book.   When you are ready to remove patch, follow instructions on last 2 pages of Patient Log Book.  Stick patch monitor onto last page of Patient Log Book.   Place Patient Log Book in [place] box.  Use locking tab on box and tape box closed securely.  The [employer] has [employer] on it.  Please place in mailbox as soon as possible.  Your physician should have your test results approximately 7 days after the monitor has been mailed back to [facility].   Call [place] at [phone] if you have questions regarding your ZIO XT patch monitor.  Call them immediately if you see an orange light blinking on your monitor.   If your monitor falls off in less than 4 days contact our Monitor department at [phone].  If your monitor becomes loose or falls off after 4 days  call [employer] at [phone] for suggestions on securing your monitor.    Follow-Up: At [facility],  you and your health needs are our priority.  As part of our continuing mission to provide you with exceptional heart care, we have created designated Provider Care Teams.  These Care Teams include your primary Cardiologist (physician) and Advanced Practice Providers (APPs -  Physician Assistants and Nurse Practitioners) who all work together to provide you with the care you need, when you need it.  We recommend signing up for the patient portal called "MyChart".  Sign up information is provided on this After Visit Summary.  MyChart is used to connect with patients for Virtual Visits (Telemedicine).  Patients are able to view lab/test results, encounter notes, upcoming appointments, etc.  Non-urgent messages can be sent to your provider as well.   To learn more about what you can do with MyChart, go to [email or web address].    Your next appointment:   2 month(s)  The format for your next appointment:   In Person  Provider:   Oswaldo Milian, MD      Signed, Donato Heinz, MD  03/09/2020 7:05 PM    [place]

## 2020-03-09 ENCOUNTER — Ambulatory Visit: Payer: Commercial Managed Care - PPO | Admitting: Cardiology

## 2020-03-09 ENCOUNTER — Encounter: Payer: Self-pay | Admitting: Cardiology

## 2020-03-09 ENCOUNTER — Other Ambulatory Visit: Payer: Self-pay

## 2020-03-09 ENCOUNTER — Encounter: Payer: Self-pay | Admitting: *Deleted

## 2020-03-09 VITALS — BP 144/92 | HR 106 | Ht 67.0 in | Wt 247.0 lb

## 2020-03-09 DIAGNOSIS — R000 Tachycardia, unspecified: Secondary | ICD-10-CM | POA: Diagnosis not present

## 2020-03-09 NOTE — Progress Notes (Signed)
Patient ID: Janet King, female   DOB: [id number], 54 y.o.   MRN: [id number] Patient enrolled for [employer] to ship a 3 day ZIO XT long term holter monitor to her home.

## 2020-03-09 NOTE — Patient Instructions (Signed)
Medication Instructions:  Your physician recommends that you continue on your current medications as directed. Please refer to the Current Medication list given to you today.  Testing/Procedures:  Bryn Gulling- Long Term Monitor Instructions   Your physician has requested you wear your ZIO patch monitor for 3 days.   This is a single patch monitor.  [employer] supplies one patch monitor per enrollment.  Additional stickers are not available.   Please do not apply patch if you will be having a Nuclear Stress Test, Echocardiogram, Cardiac CT, MRI, or Chest Xray during the time frame you would be wearing the monitor. The patch cannot be worn during these tests.  You cannot remove and re-apply the ZIO XT patch monitor.   Your ZIO patch monitor will be sent USPS Priority mail from [facility] directly to your home address. The monitor may also be mailed to a PO BOX if home delivery is not available.   It may take 3-5 days to receive your monitor after you have been enrolled.   Once you have received you monitor, please review enclosed instructions.  Your monitor has already been registered assigning a specific monitor serial # to you.   Applying the monitor   Shave hair from upper left chest.   Hold abrader disc by orange tab.  Rub abrader in 40 strokes over left upper chest as indicated in your monitor instructions.   Clean area with 4 enclosed alcohol pads .  Use all pads to assure are is cleaned thoroughly.  Let dry.   Apply patch as indicated in monitor instructions.  Patch will be place under collarbone on left side of chest with arrow pointing upward.   Rub patch adhesive wings for 2 minutes.Remove white label marked "1".  Remove white label marked "2".  Rub patch adhesive wings for 2 additional minutes.   While looking in a mirror, press and release button in center of patch.  A small green light will flash 3-4 times .  This will be your only indicator the monitor has been turned on.     Do not shower for the first 24 hours.  You may shower after the first 24 hours.   Press button if you feel a symptom. You will hear a small click.  Record Date, Time and Symptom in the Patient Log Book.   When you are ready to remove patch, follow instructions on last 2 pages of Patient Log Book.  Stick patch monitor onto last page of Patient Log Book.   Place Patient Log Book in [place] box.  Use locking tab on box and tape box closed securely.  The [employer] has [employer] on it.  Please place in mailbox as soon as possible.  Your physician should have your test results approximately 7 days after the monitor has been mailed back to [facility].   Call [place] at [phone] if you have questions regarding your ZIO XT patch monitor.  Call them immediately if you see an orange light blinking on your monitor.   If your monitor falls off in less than 4 days contact our Monitor department at [phone].  If your monitor becomes loose or falls off after 4 days call [employer] at [phone] for suggestions on securing your monitor.    Follow-Up: At [facility], you and your health needs are our priority.  As part of our continuing mission to provide you with exceptional heart care, we have created designated Provider Care Teams.  These Care Teams include your primary Cardiologist (physician) and Advanced Practice Providers (APPs -  Physician Assistants and Nurse Practitioners) who all work together to provide you with the care you need, when you need it.  We recommend signing up for the patient portal called "MyChart".  Sign up information is provided on this After Visit Summary.  MyChart is used to connect with patients for Virtual Visits (Telemedicine).  Patients are able to view lab/test results, encounter notes, upcoming appointments, etc.  Non-urgent messages can be sent to your provider as well.   To learn more about what you can do with MyChart, go  to [email or web address].    Your next appointment:   2 month(s)  The format for your next appointment:   In Person  Provider:   Oswaldo Milian, MD

## 2020-03-11 ENCOUNTER — Other Ambulatory Visit: Payer: Self-pay | Admitting: Hematology and Oncology

## 2020-03-11 DIAGNOSIS — Z853 Personal history of malignant neoplasm of breast: Secondary | ICD-10-CM

## 2020-03-11 DIAGNOSIS — Z9889 Other specified postprocedural states: Secondary | ICD-10-CM

## 2020-03-15 ENCOUNTER — Ambulatory Visit (INDEPENDENT_AMBULATORY_CARE_PROVIDER_SITE_OTHER): Payer: Commercial Managed Care - PPO

## 2020-03-15 DIAGNOSIS — R000 Tachycardia, unspecified: Secondary | ICD-10-CM

## 2020-04-22 ENCOUNTER — Ambulatory Visit
Admission: RE | Admit: 2020-04-22 | Discharge: 2020-04-22 | Disposition: A | Discharge status: Home / Self Care | Payer: Commercial Managed Care - PPO | Source: Ambulatory Visit | Attending: Hematology and Oncology | Admitting: Hematology and Oncology

## 2020-04-22 ENCOUNTER — Other Ambulatory Visit: Payer: Self-pay

## 2020-04-22 DIAGNOSIS — Z9889 Other specified postprocedural states: Secondary | ICD-10-CM

## 2020-04-22 DIAGNOSIS — Z853 Personal history of malignant neoplasm of breast: Secondary | ICD-10-CM

## 2020-05-19 NOTE — Progress Notes (Signed)
Cardiology Office Note:    Date:  05/20/2020   ID:  Janet King, DOB [birth year], MRN [id number]  PCP:  [name], MD  Cardiologist:  No primary care provider on file.  Electrophysiologist:  None   Referring MD: [name], MD   No chief complaint on file.   History of Present Illness:    Janet King is a 54 y.o. female with a hx of breast cancer, hypothyroidism who presents for follow-up.  She was referred by Dr. Chalmers Cater for evaluation of tachycardia, initially seen on 03/09/2020.  She was noted to have elevated heart rate at her clinic visit with Dr. Chalmers Cater.  Reports that her heart rate is always elevated.  Denies any palpitations, lower extremity edema, chest pain, or dyspnea.  Recently has been feeling lightheaded since he got her second COVID-19 vaccine last week.  Reports that she walks anywhere from 1-5 times per week for anywhere from 10 to 45 minutes.  She denies any exertional symptoms.  Most recent TTE in 2018 showed LVEF 55 to 60%, no significant valvular disease.  Zio patch x3 days on 04/02/2020 showed no significant arrhythmias.  Since last clinic visit, she reports that she has been doing well.  She denies any palpitations.  Denies any chest pain, dyspnea, syncope, or lightheadedness.  States that she has been walking 2-3 times per week for 30 minutes.  She denies any symptoms with exertion.  Past Medical History:  Diagnosis Date  . Baker's cyst of knee   . Breast cancer ([place])    right  . H/O bone density study   . History of radiation therapy 08/10/16-09/16/16   right breast 50.4 Gy  . Hypothyroidism   . Personal history of chemotherapy   . Personal history of radiation therapy     Past Surgical History:  Procedure Laterality Date  . BREAST BIOPSY    . BREAST LUMPECTOMY     invasive carcinoma right april 2017  . BREAST RECONSTRUCTION Bilateral 03/01/2016   Procedure: RIGHT ONCOPLASTIC BREAST RECONSTRUCTION WITH LEFT REDUCTION FOR  SYMMETRY;  Surgeon: Irene Limbo, MD;  Location: [place];  Service: Plastics;  Laterality: Bilateral;  . EXCISION OF BREAST LESION Right 05/09/2017   Procedure: EXCISION OF BREAST LESION;  Surgeon: Irene Limbo, MD;  Location: [place];  Service: Plastics;  Laterality: Right;  . PERIPHERAL VASCULAR CATHETERIZATION Right 03/01/2016   Procedure: PORTA CATH INSERTION;  Surgeon: Rolm Bookbinder, MD;  Location: [place];  Service: General;  Laterality: Right;  . PORT-A-CATH REMOVAL Right 05/09/2017   Procedure: REMOVAL PORT-A-CATH AND EXCISION OF ULCER RIGHT BREAST;  Surgeon: Irene Limbo, MD;  Location: [place];  Service: Plastics;  Laterality: Right;  . RADIOACTIVE SEED GUIDED PARTIAL MASTECTOMY WITH AXILLARY SENTINEL LYMPH NODE BIOPSY Right 03/01/2016   Procedure: RADIOACTIVE SEED GUIDED PARTIAL MASTECTOMY WITH AXILLARY SENTINEL LYMPH NODE BIOPSY;  Surgeon: Rolm Bookbinder, MD;  Location: [place];  Service: General;  Laterality: Right;  . RE-EXCISION OF BREAST CANCER,SUPERIOR MARGINS Right 03/15/2016   Procedure: RE-EXCISION OF BREAST CANCER, SUPERIOR MARGINS;  Surgeon: Rolm Bookbinder, MD;  Location: [place];  Service: General;  Laterality: Right;  . REDUCTION MAMMAPLASTY     bilateral 2017  . TONSILLECTOMY      Current Medications: Current Meds  Medication Sig  . levothyroxine (SYNTHROID, LEVOTHROID) 137 MCG tablet Take 1 tablet (137 mcg total) by mouth daily before breakfast.  . tamoxifen (NOLVADEX) 20 MG tablet  Take 1 tablet (20 mg total) by mouth daily.  . Vitamin D, Ergocalciferol, (DRISDOL) 50000 units CAPS capsule TK ONE C PO TWICE A WEEK     Allergies:   Adhesive [tape] and Penicillin g   Social History   Socioeconomic History  . Marital status: Single    Spouse name: Not on file  . Number of children: 0  . Years of education: Not on file  . Highest education  level: Not on file  Occupational History  . Occupation: Education officer, museum  Tobacco Use  . Smoking status: Never Smoker  . Smokeless tobacco: Never Used  Substance and Sexual Activity  . Alcohol use: Yes    Alcohol/week: 0.0 standard drinks    Comment: occ - once every 3 mos  . Drug use: No  . Sexual activity: Not Currently  Other Topics Concern  . Not on file  Social History Narrative  . Not on file   Social Determinants of Health   Financial Resource Strain:   . Difficulty of Paying Living Expenses:   Food Insecurity:   . Worried About Charity fundraiser in the Last Year:   . Arboriculturist in the Last Year:   Transportation Needs:   . Film/video editor (Medical):   Marland Kitchen Lack of Transportation (Non-Medical):   Physical Activity:   . Days of Exercise per Week:   . Minutes of Exercise per Session:   Stress:   . Feeling of Stress :   Social Connections:   . Frequency of Communication with Friends and Family:   . Frequency of Social Gatherings with Friends and Family:   . Attends Religious Services:   . Active Member of Clubs or Organizations:   . Attends Archivist Meetings:   Marland Kitchen Marital Status:      Family History: The patient's family history includes Breast cancer in her sister and another family member; Breast cancer (age of onset: 23) in her mother; Cancer in some other family members; Colon cancer in her paternal grandmother; Heart disease in her maternal grandfather.  ROS:   Please see the history of present illness.     All other systems reviewed and are negative.  EKGs/Labs/Other Studies Reviewed:    The following studies were reviewed today:   EKG:  EKG is not ordered today.  The ekg ordered at prior clinic visit demonstrates sinus tachycardia, rate 106, no ST/T abnormalities  Recent Labs: No results found for requested labs within last 8760 hours.  Recent Lipid Panel No results found for: CHOL, TRIG, HDL, CHOLHDL, VLDL,  LDLCALC, LDLDIRECT  Physical Exam:    VS:  BP 130/78   Pulse (!) 122   Temp (!) 97 F (36.1 C)   Ht 5\' 7"  (1.702 m)   Wt 247 lb 9.6 oz (112.3 kg)   LMP 04/20/2016   SpO2 97%   BMI 38.78 kg/m     Wt Readings from Last 3 Encounters:  05/20/20 247 lb 9.6 oz (112.3 kg)  03/09/20 247 lb (112 kg)  10/24/19 255 lb 11.2 oz (116 kg)     GEN:  Well nourished, well developed in no acute distress HEENT: Normal NECK: No JVD LYMPHATICS: No lymphadenopathy CARDIAC: RRR, no murmurs, rubs, gallops RESPIRATORY:  Clear to auscultation without rales, wheezing or rhonchi  ABDOMEN: Soft, non-tender, non-distended MUSCULOSKELETAL:  No edema; No deformity  SKIN: Warm and dry NEUROLOGIC:  Alert and oriented x 3 PSYCHIATRIC:  Normal affect   ASSESSMENT:  1. Tachycardia   2. Hypothyroidism, unspecified type   3. Lipid screening    PLAN:     Tachycardia:  Unclear cause, Zio patch x3 days on 04/02/2020 showed no significant arrhythmias.. Average heart rate 96 bpm on monitor.  TTE in 2018 showed no structural heart disease.  Will check TSH/free T4 to evaluate for hyperthyroidism as cause of her tachycardia.  Cardiovascular risk assessment: will check a lipid panel  RTC in 1 year  Medication Adjustments/Labs and Tests Ordered: Current medicines are reviewed at length with the patient today.  Concerns regarding medicines are outlined above.  Orders Placed This Encounter  Procedures  . TSH  . Lipid panel  . T4, free   No orders of the defined types were placed in this encounter.   There are no Patient Instructions on file for this visit.   Signed, Donato Heinz, MD  05/20/2020 8:27 AM    [place]

## 2020-05-20 ENCOUNTER — Ambulatory Visit: Payer: Commercial Managed Care - PPO | Admitting: Cardiology

## 2020-05-20 ENCOUNTER — Other Ambulatory Visit: Payer: Self-pay

## 2020-05-20 ENCOUNTER — Encounter: Payer: Self-pay | Admitting: Cardiology

## 2020-05-20 VITALS — BP 130/78 | HR 122 | Temp 97.0°F | Ht 67.0 in | Wt 247.6 lb

## 2020-05-20 DIAGNOSIS — Z13220 Encounter for screening for lipoid disorders: Secondary | ICD-10-CM

## 2020-05-20 DIAGNOSIS — R000 Tachycardia, unspecified: Secondary | ICD-10-CM

## 2020-05-20 DIAGNOSIS — E039 Hypothyroidism, unspecified: Secondary | ICD-10-CM

## 2020-05-20 LAB — LIPID PANEL
Chol/HDL Ratio: 4.1 ratio (ref 0.0–4.4)
Cholesterol, Total: 150 mg/dL (ref 100–199)
HDL: 37 mg/dL — ABNORMAL LOW (ref >39)
LDL Chol Calc (NIH): 77 mg/dL (ref 0–99)
Triglycerides: 218 mg/dL — ABNORMAL HIGH (ref 0–149)
VLDL Cholesterol Cal: 36 mg/dL (ref 5–40)

## 2020-05-20 LAB — T4, FREE: Free T4: 1.45 ng/dL (ref 0.82–1.77)

## 2020-05-20 LAB — TSH: TSH: 1.29 u[IU]/mL (ref 0.450–4.500)

## 2020-05-20 NOTE — Patient Instructions (Addendum)
Medication Instructions:  Your physician recommends that you continue on your current medications as directed. Please refer to the Current Medication list given to you today.  Lab Work: TSH, freeT4, Lipid panel today  If you have labs (blood work) drawn today and your tests are completely normal, you will receive your results only by: Marland Kitchen MyChart Message (if you have MyChart) OR . A paper copy in the mail If you have any lab test that is abnormal or we need to change your treatment, we will call you to review the results.  Follow-Up: At [facility], you and your health needs are our priority.  As part of our continuing mission to provide you with exceptional heart care, we have created designated Provider Care Teams.  These Care Teams include your primary Cardiologist (physician) and Advanced Practice Providers (APPs -  Physician Assistants and Nurse Practitioners) who all work together to provide you with the care you need, when you need it.  We recommend signing up for the patient portal called "MyChart".  Sign up information is provided on this After Visit Summary.  MyChart is used to connect with patients for Virtual Visits (Telemedicine).  Patients are able to view lab/test results, encounter notes, upcoming appointments, etc.  Non-urgent messages can be sent to your provider as well.   To learn more about what you can do with MyChart, go to [email or web address].    Your next appointment:   12 month(s)  The format for your next appointment:   In Person  Provider:   Oswaldo Milian, MD

## 2020-10-16 ENCOUNTER — Ambulatory Visit: Payer: Commercial Managed Care - PPO | Attending: Internal Medicine

## 2020-10-16 DIAGNOSIS — Z23 Encounter for immunization: Secondary | ICD-10-CM

## 2020-10-16 NOTE — Progress Notes (Addendum)
   Covid-19 Vaccination Clinic  Name:  Janet King    MRN: [id number] DOB: March 10, 1966  10/16/2020  Janet King was observed post Covid-19 immunization for 30 minutes without incident. She was provided with Vaccine Information Sheet and instruction to access the V-Safe system.   Janet King was instructed to call 911 with any severe reactions post vaccine: Marland Kitchen Difficulty breathing  . Swelling of face and throat  . A fast heartbeat  . A bad rash all over body  . Dizziness and weakness   Immunizations Administered    Name Date Dose VIS Date Route   Pfizer COVID-19 Vaccine 10/16/2020  4:32 PM 0.3 mL 09/02/2020 Intramuscular   Manufacturer: [employer]   Lot: [id number]   NDC: [address]-8

## 2020-10-23 ENCOUNTER — Inpatient Hospital Stay: Payer: Commercial Managed Care - PPO | Attending: Hematology and Oncology | Admitting: Hematology and Oncology

## 2020-10-23 ENCOUNTER — Other Ambulatory Visit: Payer: Self-pay

## 2020-10-23 DIAGNOSIS — Z79810 Long term (current) use of selective estrogen receptor modulators (SERMs): Secondary | ICD-10-CM | POA: Insufficient documentation

## 2020-10-23 DIAGNOSIS — Z170 Estrogen receptor positive status [ER+]: Secondary | ICD-10-CM | POA: Diagnosis not present

## 2020-10-23 DIAGNOSIS — Z923 Personal history of irradiation: Secondary | ICD-10-CM | POA: Insufficient documentation

## 2020-10-23 DIAGNOSIS — C50211 Malignant neoplasm of upper-inner quadrant of right female breast: Secondary | ICD-10-CM | POA: Insufficient documentation

## 2020-10-23 MED ORDER — TAMOXIFEN CITRATE 20 MG PO TABS: 20.0000 mg | ORAL_TABLET | Freq: Every day | ORAL | 3 refills | Status: DC

## 2020-10-23 NOTE — Progress Notes (Signed)
Patient Care Team: [name], MD as PCP - General (Obstetrics and Gynecology) Delice Bison, Charlestine Massed, NP as Nurse Practitioner (Hematology and Oncology) Nicholas Lose, MD as Consulting Physician (Hematology and Oncology) Irene Limbo, MD as Consulting Physician (Plastic Surgery) Gery Pray, MD as Consulting Physician (Radiation Oncology) Rolm Bookbinder, MD as Consulting Physician (General Surgery)  DIAGNOSIS:  Encounter Diagnosis  Name Primary?  . Malignant neoplasm of upper-inner quadrant of right breast in female, estrogen receptor positive ([place])     SUMMARY OF ONCOLOGIC HISTORY: Oncology History  Breast cancer of upper-inner quadrant of right female breast ([place])  01/21/2016 Initial Diagnosis   Right breast biopsy: Invasive lobular cancer, ER 95%, PR 95%, Ki-67 5%, HER-2 positive ratio 2.52   02/01/2016 Breast MRI   Right breast: Post biopsy hematoma measuring 1.9 x 1.3 x 2.5 cm surrounding ring of reactive enhancement, no lymphadenopathy. T2 N0 stage II a clinical stage   03/01/2016 Surgery   Right lumpectomy: ILC, grade 2, 0.8 cm, ALH, additional superior margin : ILC grade to 0.3 cm , positive superior margin , 0/1 LN neg, mammoplasty bil : benign, ER 95%, PR 95%, HER-2 pos, Ki-67 5%, T1bN0 stage IA   03/15/2016 Surgery   Right breast. Margin excision: Benign   04/20/2016 - 04/17/2017 Chemotherapy   Taxol Herceptin weekly 12 followed by Herceptin maintenance for 1 year   08/10/2016 - 09/16/2016 Radiation Therapy   Adjuvant radiation therapy   11/14/2016 -  Anti-estrogen oral therapy   Tamoxifen 20 mg daily     CHIEF COMPLIANT:   INTERVAL HISTORY: Janet King is a   ALLERGIES:  is allergic to adhesive [tape] and penicillin g.  MEDICATIONS:  Current Outpatient Medications  Medication Sig Dispense Refill  . levothyroxine (SYNTHROID, LEVOTHROID) 137 MCG tablet Take 1 tablet (137 mcg total) by mouth daily before breakfast.    . tamoxifen  (NOLVADEX) 20 MG tablet Take 1 tablet (20 mg total) by mouth daily. 90 tablet 3  . Vitamin D, Ergocalciferol, (DRISDOL) 50000 units CAPS capsule TK ONE C PO TWICE A WEEK  4   No current facility-administered medications for this visit.    PHYSICAL EXAMINATION: ECOG PERFORMANCE STATUS: 1 - Symptomatic but completely ambulatory  Vitals:   10/23/20 1025  BP: 139/84  Pulse: (!) 111  Resp: 18  Temp: 98.8 F (37.1 C)  SpO2: 97%   Filed Weights   10/23/20 1025  Weight: 250 lb 1.6 oz (113.4 kg)    BREAST: No palpable masses or nodules in either right or left breasts. No palpable axillary supraclavicular or infraclavicular adenopathy no breast tenderness or nipple discharge. (exam performed in the presence of a chaperone)  LABORATORY DATA:  I have reviewed the data as listed CMP Latest Ref Rng & Units 06/13/2018 04/17/2017 03/06/2017  Glucose 70 - 99 mg/dL 143(H) 138 92  BUN 6 - 20 mg/dL 19 13.7 16.5  Creatinine 0.44 - 1.00 mg/dL 1.06(H) 0.9 0.8  Sodium 135 - 145 mmol/L 140 143 142  Potassium 3.5 - 5.1 mmol/L 4.1 4.0 4.0  Chloride 98 - 111 mmol/L 110 - -  CO2 22 - 32 mmol/L '23 25 25  ' Calcium 8.9 - 10.3 mg/dL 9.2 9.0 8.9  Total Protein 6.5 - 8.1 g/dL 7.5 7.1 7.0  Total Bilirubin 0.3 - 1.2 mg/dL 0.4 0.48 0.26  Alkaline Phos 38 - 126 U/L 69 80 69  AST 15 - 41 U/L '24 13 15  ' ALT 0 - 44 U/L 23 15 13  Lab Results  Component Value Date   WBC 9.2 06/13/2018   HGB 12.7 06/13/2018   HCT 38.6 06/13/2018   MCV 86.5 06/13/2018   PLT 202 06/13/2018   NEUTROABS 5.0 06/13/2018    ASSESSMENT & PLAN:  Breast cancer of upper-inner quadrant of right female breast ([place]) Right lumpectomy 03/01/2016: ILC, grade 2, 0.8 cm, ALH, additional superior margin : ILC grade to 0.3 cm , positive superior margin , 0/1 LN neg, mammoplasty bil : benign, ER 95%, PR 95%, HER-2 positive on primary tumor and negative on secondary mass which had a ratio of 1.55, gene copy number is 2.25, Ki-67 5%, T1bN0 stage  IA  HER-2: HER-2 was positive in the original tumor and the HER-2 on the second excision was negative. I was able to get this clarification from pathology.  Reexcision of superior margin: 5 2017: Benign  Treatment plan: 1. Adjuvant therapy with Taxol and Herceptin weekly 12 completed 07/06/2016 2. Followed by Herceptin maintenance every 3 weeks 1 year (was on hold 06/29/2016 to 08/08/2016 due to a decrease in ejection fraction)completed June 2018 2. Followedadjuvant radiation 08/10/2016 to 09/16/2016 3. Antiestrogen therapy With tamoxifen to start January 2018 (patient's last menstrual cycle was June 2017) ----------------------------------------------------------------------------------------------------------------------------- Current treatment:Tamoxifen 20 mg daily started January 2018 (10 years recommended)  Tamoxifen Toxicities: No further problems with hot flashes or myalgias.  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  Return to clinic in 1 year for follow-up    No orders of the defined types were placed in this encounter.  The patient has a good understanding of the overall plan. she agrees with it. she will call with any problems that may develop before the next visit here. Total time spent: 30 mins including face to face time and time spent for planning, charting and co-ordination of care   Harriette Ohara, MD 10/23/20

## 2020-10-23 NOTE — Assessment & Plan Note (Signed)
Right lumpectomy 03/01/2016: ILC, grade 2, 0.8 cm, ALH, additional superior margin : ILC grade to 0.3 cm , positive superior margin , 0/1 LN neg, mammoplasty bil : benign, ER 95%, PR 95%, HER-2 positive on primary tumor and negative on secondary mass which had a ratio of 1.55, gene copy number is 2.25, Ki-67 5%, T1bN0 stage IA  HER-2: HER-2 was positive in the original tumor and the HER-2 on the second excision was negative. I was able to get this clarification from pathology.  Reexcision of superior margin: 5 2017: Benign  Treatment plan: 1. Adjuvant therapy with Taxol and Herceptin weekly 12 completed 07/06/2016 2. Followed by Herceptin maintenance every 3 weeks 1 year (was on hold 06/29/2016 to 08/08/2016 due to a decrease in ejection fraction)completed June 2018 2. Followedadjuvant radiation 08/10/2016 to 09/16/2016 3. Antiestrogen therapy With tamoxifen to start January 2018 (patient's last menstrual cycle was June 2017) ----------------------------------------------------------------------------------------------------------------------------- Current treatment:Tamoxifen 20 mg daily started January 2018  Tamoxifen Toxicities: No further problems with hot flashes or myalgias.  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  Return to clinic in 1 year for follow-up

## 2020-12-10 ENCOUNTER — Other Ambulatory Visit: Payer: Self-pay | Admitting: Endocrinology

## 2020-12-10 DIAGNOSIS — E049 Nontoxic goiter, unspecified: Secondary | ICD-10-CM

## 2020-12-22 ENCOUNTER — Ambulatory Visit
Admission: RE | Admit: 2020-12-22 | Discharge: 2020-12-22 | Disposition: A | Discharge status: Home / Self Care | Payer: Commercial Managed Care - PPO | Source: Ambulatory Visit | Attending: Endocrinology | Admitting: Endocrinology

## 2020-12-22 DIAGNOSIS — E049 Nontoxic goiter, unspecified: Secondary | ICD-10-CM

## 2021-03-22 ENCOUNTER — Other Ambulatory Visit: Payer: Self-pay | Admitting: Hematology and Oncology

## 2021-03-22 DIAGNOSIS — Z1231 Encounter for screening mammogram for malignant neoplasm of breast: Secondary | ICD-10-CM

## 2021-04-15 ENCOUNTER — Telehealth: Payer: Self-pay | Admitting: Hematology and Oncology

## 2021-04-15 NOTE — Telephone Encounter (Signed)
R/s per prov pal, per 12/10 los, pt aware.

## 2021-05-12 ENCOUNTER — Other Ambulatory Visit: Payer: Self-pay

## 2021-05-12 ENCOUNTER — Ambulatory Visit
Admission: RE | Admit: 2021-05-12 | Discharge: 2021-05-12 | Disposition: A | Discharge status: Home / Self Care | Payer: Commercial Managed Care - PPO | Source: Ambulatory Visit | Attending: Hematology and Oncology | Admitting: Hematology and Oncology

## 2021-05-12 DIAGNOSIS — Z1231 Encounter for screening mammogram for malignant neoplasm of breast: Secondary | ICD-10-CM

## 2021-06-16 ENCOUNTER — Ambulatory Visit: Payer: Commercial Managed Care - PPO | Admitting: Cardiology

## 2021-07-02 ENCOUNTER — Ambulatory Visit: Payer: Commercial Managed Care - PPO | Admitting: Plastic Surgery

## 2021-07-02 ENCOUNTER — Other Ambulatory Visit: Payer: Self-pay

## 2021-07-02 DIAGNOSIS — C50211 Malignant neoplasm of upper-inner quadrant of right female breast: Secondary | ICD-10-CM | POA: Diagnosis not present

## 2021-07-02 DIAGNOSIS — Z170 Estrogen receptor positive status [ER+]: Secondary | ICD-10-CM

## 2021-07-02 NOTE — Progress Notes (Signed)
Referring Provider [name], MD [address] [place],  [place] [address]   CC: No chief complaint on file.     Janet King is an 55 y.o. female.  HPI: Patient presents to discuss revision of her breast reconstruction.  She initially had a lumpectomy on the right side several years ago.  She had an oncoplastic reduction that was done at the same time.  I believe she required a reexcision for positive margin which was clear.  She was subsequently had chemotherapy and radiation to the right breast.  She had a left reduction at the same time as the initial right sided reduction was performed.  Subsequent to the radiation she had some healing difficulties particularly around the areola on the right side.  She developed a sinus tract which ultimately required surgical excision and closure.  She has healed from that but is dissatisfied with the symmetry.  The right nipple areolar complex is higher than the left and there is a size discrepancy with the left being larger.  She also has pain in the right axilla and is bothered by axillary accessory breast tissue extending in the left axilla.  Allergies  Allergen Reactions   Adhesive [Tape] Other (See Comments)    Redness    Penicillin G Rash    Outpatient Encounter Medications as of 07/02/2021  Medication Sig   levothyroxine (SYNTHROID, LEVOTHROID) 137 MCG tablet Take 1 tablet (137 mcg total) by mouth daily before breakfast.   tamoxifen (NOLVADEX) 20 MG tablet Take 1 tablet (20 mg total) by mouth daily.   Vitamin D, Ergocalciferol, (DRISDOL) 50000 units CAPS capsule TK ONE C PO TWICE A WEEK   No facility-administered encounter medications on file as of 07/02/2021.     Past Medical History:  Diagnosis Date   Baker's cyst of knee    Breast cancer ([place])    right   H/O bone density study    History of radiation therapy 08/10/16-09/16/16   right breast 50.4 Gy   Hypothyroidism    Personal history of chemotherapy     Personal history of radiation therapy     Past Surgical History:  Procedure Laterality Date   BREAST BIOPSY     BREAST LUMPECTOMY     invasive carcinoma right april 2017   BREAST RECONSTRUCTION Bilateral 03/01/2016   Procedure: RIGHT ONCOPLASTIC BREAST RECONSTRUCTION WITH LEFT REDUCTION FOR SYMMETRY;  Surgeon: Irene Limbo, MD;  Location: [facility];  Service: Plastics;  Laterality: Bilateral;   EXCISION OF BREAST LESION Right 05/09/2017   Procedure: EXCISION OF BREAST LESION;  Surgeon: Irene Limbo, MD;  Location: [facility];  Service: Plastics;  Laterality: Right;   PERIPHERAL VASCULAR CATHETERIZATION Right 03/01/2016   Procedure: PORTA CATH INSERTION;  Surgeon: Rolm Bookbinder, MD;  Location: [facility];  Service: General;  Laterality: Right;   PORT-A-CATH REMOVAL Right 05/09/2017   Procedure: REMOVAL PORT-A-CATH AND EXCISION OF ULCER RIGHT BREAST;  Surgeon: Irene Limbo, MD;  Location: [facility];  Service: Plastics;  Laterality: Right;   RADIOACTIVE SEED GUIDED PARTIAL MASTECTOMY WITH AXILLARY SENTINEL LYMPH NODE BIOPSY Right 03/01/2016   Procedure: RADIOACTIVE SEED GUIDED PARTIAL MASTECTOMY WITH AXILLARY SENTINEL LYMPH NODE BIOPSY;  Surgeon: Rolm Bookbinder, MD;  Location: [facility];  Service: General;  Laterality: Right;   RE-EXCISION OF BREAST CANCER,SUPERIOR MARGINS Right 03/15/2016   Procedure: RE-EXCISION OF BREAST CANCER, SUPERIOR MARGINS;  Surgeon: Rolm Bookbinder, MD;  Location: [facility];  Service: General;  Laterality: Right;   REDUCTION MAMMAPLASTY     bilateral 2017   TONSILLECTOMY      Family History  Problem Relation Age of Onset   Breast cancer Mother 69   Breast cancer Sister    Heart disease Maternal Grandfather    Colon cancer Paternal Grandmother        dx. 57s   Cancer Other        maternal great aunt (MGM's sister) dx. with NOS cancer at older age    Breast cancer Other        maternal great aunt (MGF's sister) dx. in her 60s; s/p mastectomy   Cancer Other        paternal great aunt (PGM's sister) dx. with NOS cancer at older age    Social History   Social History Narrative   Not on file  Denies tobacco use  Review of Systems General: Denies fevers, chills, weight loss CV: Denies chest pain, shortness of breath, palpitations  Physical Exam Vitals with BMI 10/23/2020 05/20/2020 03/09/2020  Height '5\' 7"'$  '5\' 7"'$  '5\' 7"'$   Weight 250 lbs 2 oz 247 lbs 10 oz 247 lbs  BMI 39.16 [id number]  Systolic [id number]  Diastolic 84 78 92  Pulse [id number] 122 106    General:  No acute distress,  Alert and oriented, Non-Toxic, Normal speech and affect Breast: She has pseudoptosis on the left.  She has a depression at about 10 or 11:00 in the right NAC.  There is some radiation changes and telangiectasias of the skin on the right side.  The footprint in volume is constricted on the right side compared to the left as a result of the radiation.  Otherwise the skin fibrosis changes do not appear to be too bad and overall the skin envelope is soft.  The right nipple areolar complex is smaller than the left.  She has a little bit of fullness in the right axilla which is where her sentinel lymph node procedure was done.  She has preaxillary accessory breast tissue on the left side.  Assessment/Plan Patient presents with asymmetries after reconstructive surgery for treatment of her right-sided breast cancer.  I do believe that revision procedure would help her.  My plan would be to incise and free up the tethered depression in the right nipple areolar complex.  I would place a full-thickness de-epithelialized skin graft beneath this to try to prevent it from scarring back down.  She would need a revision reduction on the left side to improve the asymmetry in both the volume and nipple areolar complex.  I could do a excision of the left accessory breast tissue as well and  perform liposuction in the right axilla to try and help with the pain and fullness in that area.  I have had success with this in the past.  We discussed the risks of the procedure that include bleeding, infection, damage to surrounding structures and need for additional procedures.  All her questions were answered.  She wants to take some time to think this over and will be available to move forward if she would like.  Cindra Presume 07/02/2021, 3:26 PM

## 2021-07-14 ENCOUNTER — Other Ambulatory Visit: Payer: Self-pay

## 2021-07-14 ENCOUNTER — Ambulatory Visit: Payer: Commercial Managed Care - PPO | Admitting: Podiatry

## 2021-07-14 DIAGNOSIS — M792 Neuralgia and neuritis, unspecified: Secondary | ICD-10-CM | POA: Diagnosis not present

## 2021-07-15 NOTE — Progress Notes (Signed)
  Subjective:  Patient ID: Janet King, female    DOB: [birth year],  MRN: [id number]  Chief Complaint  Patient presents with   Toe Pain    Left foot 5th toe pain on the side of the toe     55 y.o. female presents with the above complaint.  Patient presents with complaint of left fifth digit neuritis.  Patient states is painful on the side of the toe.  It feels like more of a burning tingling.  Of right rubs against the side of the toe she cannot wear any shoes.  She states that she tends to wear a little bit tighter shoes.  She has a personal history of radiation therapy.  She has not seen MRIs prior to seeing me.  She would like to discuss treatment options for this.   Review of Systems: Negative except as noted in the HPI. Denies N/V/F/Ch.  Past Medical History:  Diagnosis Date   Baker's cyst of knee    Breast cancer ([place])    right   H/O bone density study    History of radiation therapy 08/10/16-09/16/16   right breast 50.4 Gy   Hypothyroidism    Personal history of chemotherapy    Personal history of radiation therapy     Current Outpatient Medications:    levothyroxine (SYNTHROID, LEVOTHROID) 137 MCG tablet, Take 1 tablet (137 mcg total) by mouth daily before breakfast., Disp: , Rfl:    tamoxifen (NOLVADEX) 20 MG tablet, Take 1 tablet (20 mg total) by mouth daily., Disp: 90 tablet, Rfl: 3   Vitamin D, Ergocalciferol, (DRISDOL) 50000 units CAPS capsule, TK ONE C PO TWICE A WEEK, Disp: , Rfl: 4  Social History   Tobacco Use  Smoking Status Never  Smokeless Tobacco Never    Allergies  Allergen Reactions   Adhesive [Tape] Other (See Comments)    Redness    Penicillin G Rash   Objective:  There were no vitals filed for this visit. There is no height or weight on file to calculate BMI. Constitutional Well developed. Well nourished.  Vascular Dorsalis pedis pulses palpable bilaterally. Posterior tibial pulses palpable bilaterally. Capillary refill normal to all  digits.  No cyanosis or clubbing noted. Pedal hair growth normal.  Neurologic Normal speech. Oriented to person, place, and time. Epicritic sensation to light touch grossly present bilaterally.  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  Negative Tinel's sign of the sural nerve.  Mild hyperkeratotic lesion/corn noted on the fifth digit  Orthopedic: Normal joint ROM without pain or crepitus bilaterally. No visible deformities. No bony tenderness.   Radiographs: None Assessment:   1. Neuritis    Plan:  Patient was evaluated and treated and all questions answered.  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  Ultimately I discussed with patient she will benefit from shoe gear modification.  I discussed shoe gear modification as well as protecting the toe from rubbing against it.  This was likely the culprit for leading to constant pain to the fifth digit.  If it does not improve we can discuss other treatment options.  For now we will plan on short shoe gear modification and toe protectors.  No follow-ups on file.

## 2021-08-09 NOTE — Progress Notes (Signed)
Cardiology Clinic Note   Patient Name: Janet King [facility] Date of Encounter: 08/10/2021  Primary Care Provider:  [name], MD Primary Cardiologist:  Donato Heinz, MD  Patient Profile    Janet King 55 year old female presents the clinic today for follow-up evaluation of her tachycardia  Past Medical History    Past Medical History:  Diagnosis Date   Baker's cyst of knee    Breast cancer ([place])    right   H/O bone density study    History of radiation therapy 08/10/16-09/16/16   right breast 50.4 Gy   Hypothyroidism    Personal history of chemotherapy    Personal history of radiation therapy    Past Surgical History:  Procedure Laterality Date   BREAST BIOPSY     BREAST LUMPECTOMY     invasive carcinoma right april 2017   BREAST RECONSTRUCTION Bilateral 03/01/2016   Procedure: RIGHT ONCOPLASTIC BREAST RECONSTRUCTION WITH LEFT REDUCTION FOR SYMMETRY;  Surgeon: Irene Limbo, MD;  Location: [place];  Service: Plastics;  Laterality: Bilateral;   EXCISION OF BREAST LESION Right 05/09/2017   Procedure: EXCISION OF BREAST LESION;  Surgeon: Irene Limbo, MD;  Location: [place];  Service: Plastics;  Laterality: Right;   PERIPHERAL VASCULAR CATHETERIZATION Right 03/01/2016   Procedure: PORTA CATH INSERTION;  Surgeon: Rolm Bookbinder, MD;  Location: [place];  Service: General;  Laterality: Right;   PORT-A-CATH REMOVAL Right 05/09/2017   Procedure: REMOVAL PORT-A-CATH AND EXCISION OF ULCER RIGHT BREAST;  Surgeon: Irene Limbo, MD;  Location: [place];  Service: Plastics;  Laterality: Right;   RADIOACTIVE SEED GUIDED PARTIAL MASTECTOMY WITH AXILLARY SENTINEL LYMPH NODE BIOPSY Right 03/01/2016   Procedure: RADIOACTIVE SEED GUIDED PARTIAL MASTECTOMY WITH AXILLARY SENTINEL LYMPH NODE BIOPSY;  Surgeon: Rolm Bookbinder, MD;  Location: [place];  Service: General;   Laterality: Right;   RE-EXCISION OF BREAST CANCER,SUPERIOR MARGINS Right 03/15/2016   Procedure: RE-EXCISION OF BREAST CANCER, SUPERIOR MARGINS;  Surgeon: Rolm Bookbinder, MD;  Location: [place];  Service: General;  Laterality: Right;   REDUCTION MAMMAPLASTY     bilateral 2017   TONSILLECTOMY      Allergies  Allergies  Allergen Reactions   Adhesive [Tape] Other (See Comments)    Redness    Penicillin G Rash    History of Present Illness    Janet King has a PMH of breast CA, hypothyroidism, and tachycardia.  She was referred by Dr. Chalmers Cater for evaluation of tachycardia on 03/09/2020.  She was noted to have an elevated heart rate during visit.  She denied palpitations, lower extremity swelling, chest pain, and dyspnea.  She did report some lightheadedness after her second bout of COVID-19 infection.  She was exercising anywhere from 1-5 times per week for about 10-45 minutes.  She denied exertional type symptoms.  She underwent TTE in 2018 which showed LVEF 55-60% and no significant valvular normalities.  She wore a cardiac event monitor for 3 days 04/02/2020.  That showed no arrhythmias.  When she was seen by Dr. Gardiner Rhyme on 05/20/2020.  During that time she was doing well.  She denied palpitations.  She denied chest pain dyspnea, syncope, and lightheadedness.  She had been walking 2-3 times per week for 30 minutes.  She denied exertional type symptoms.  Her TSH and free T4 were unremarkable at that time.  She presents the clinic today for follow-up evaluation states she feels well.  She does notice  a elevated amount of stress with her job.  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  She hurt her foot in August and has been limited physical activity.  She typically walks 3-4 times per week on a treadmill for 20 to 30 minutes.  She denies shortness of breath, activity tolerance, and chest pain.  We reviewed her EKG today which shows a heart  rate of 113.  She is cardiac unaware.  She reports that when she checks her pulse at home in the evening it is normally run around 100.  We reviewed symptoms that may warrant a call to the cardiology office such as increased shortness of breath, increased fatigue, activity intolerance etc.  She expressed understanding.  I offered repeat lipid screening and she wishes to defer at this time.  I will give her a heart rate log and plan follow-up for 1 year.  Today she denies chest pain, shortness of breath, lower extremity edema, fatigue, palpitations, melena, hematuria, hemoptysis, diaphoresis, weakness, presyncope, syncope, orthopnea, and PND.   Home Medications    Prior to Admission medications   Medication Sig Start Date End Date Taking? Authorizing Provider  levothyroxine (SYNTHROID, LEVOTHROID) 137 MCG tablet Take 1 tablet (137 mcg total) by mouth daily before breakfast. 06/13/18   Nicholas Lose, MD  tamoxifen (NOLVADEX) 20 MG tablet Take 1 tablet (20 mg total) by mouth daily. 10/23/20   Nicholas Lose, MD  Vitamin D, Ergocalciferol, (DRISDOL) 50000 units CAPS capsule TK ONE C PO TWICE A WEEK 04/12/17   [provider]    Family History    Family History  Problem Relation Age of Onset   Breast cancer Mother 110   Breast cancer Sister    Heart disease Maternal Grandfather    Colon cancer Paternal Grandmother        dx. 35s   Cancer Other        maternal great aunt (MGM's sister) dx. with NOS cancer at older age   Breast cancer Other        maternal great aunt (MGF's sister) dx. in her 78s; s/p mastectomy   Cancer Other        paternal great aunt (PGM's sister) dx. with NOS cancer at older age   She indicated that her mother is alive. She indicated that her father is alive. She indicated that her sister is alive. She indicated that her maternal grandmother is deceased. She indicated that her maternal grandfather is deceased. She indicated that her paternal grandmother is deceased.  She indicated that her paternal grandfather is deceased. She indicated that her paternal aunt is alive. She indicated that both of her others are deceased.  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  Tobacco Use   Smoking status: Never   Smokeless tobacco: Never  Substance and Sexual Activity   Alcohol use: Yes    Alcohol/week: 0.0 standard drinks    Comment: occ - once every 3 mos   Drug use: No   Sexual activity: Not Currently  Other Topics Concern   Not on file  Social History Narrative   Not on file   Social Determinants of Health   Financial Resource Strain: Not  on file  Food Insecurity: Not on file  Transportation Needs: Not on file  Physical Activity: Not on file  Stress: Not on file  Social Connections: Not on file  Intimate Partner Violence: Not on file     Review of Systems    General:  No chills, fever, night sweats or weight changes.  Cardiovascular:  No chest pain, dyspnea on exertion, edema, orthopnea, palpitations, paroxysmal nocturnal dyspnea. Dermatological: No rash, lesions/masses Respiratory: No cough, dyspnea Urologic: No hematuria, dysuria Abdominal:   No nausea, vomiting, diarrhea, bright red blood per rectum, melena, or hematemesis Neurologic:  No visual changes, wkns, changes in mental status. All other systems reviewed and are otherwise negative except as noted above.  Physical Exam    VS:  BP 124/78 (BP Location: Left Arm, Patient Position: Sitting, Cuff Size: Large)   Pulse (!) 113   Ht 5\' 7"  (1.702 m)   Wt 243 lb 12.8 oz (110.6 kg)   LMP 04/20/2016   SpO2 97%   BMI 38.18 kg/m  , BMI Body mass index is 38.18 kg/m. GEN: Well nourished, well developed, in no acute distress. HEENT: normal. Neck:  Supple, no JVD, carotid bruits, or masses. Cardiac: RRR, no murmurs, rubs, or gallops. No clubbing, cyanosis, edema.  Radials/DP/PT 2+ and equal bilaterally.  Respiratory:  Respirations regular and unlabored, clear to auscultation bilaterally. GI: Soft, nontender, nondistended, BS + x 4. MS: no deformity or atrophy. Skin: warm and dry, no rash. Neuro:  Strength and sensation are intact. Psych: Normal affect.  Accessory Clinical Findings    Recent Labs: No results found for requested labs within last 8760 hours.   Recent Lipid Panel    Component Value Date/Time   CHOL 150 05/20/2020 0847   TRIG 218 (H) 05/20/2020 0847   HDL 37 (L) 05/20/2020 0847   CHOLHDL 4.1 05/20/2020 0847   LDLCALC 77 05/20/2020 0847    ECG personally reviewed by me today-sinus tachycardia possible left atrial enlargement 113 bpm- No acute changes  Echocardiogram 05/01/2017 Study Conclusions   - Left ventricle: Normal GLSS at -19.2% The cavity size was normal.    Systolic function was normal. The estimated ejection fraction was    in the range of 55% to 60%. Wall motion was normal; there were no    regional wall motion abnormalities. Left ventricular diastolic    function parameters were normal.  - Mitral valve: There was trivial regurgitation.  - Tricuspid valve: There was trivial regurgitation.  - Pulmonic valve: There was trivial regurgitation.  - Pulmonary arteries: Systolic pressure could not be accurately    estimated.   Impressions:   - Normal LVF with GLSS 19.2% and trivial MR/PR and TR. Compared to    echo of 01/2017, GLSS is stable (-19.5%>>-19.2%) and LVF remains    normal   -------------------------------------------------------------------  Study data:  Comparison was made to the study of 01/16/2017.  Study  status:  Routine.  Procedure:  The patient reported no pain pre or  post test. Transthoracic echocardiography. Image quality was fair.  Study completion:  There were no  complications.  Transthoracic echocardiography.  M-mode, complete 2D, spectral  Doppler, and color Doppler.  Birthdate:  Patient birthdate:  1966/09/17.  Age:  Patient is 55 yr old.  Sex:  Gender: female.  BMI: 40 kg/m^2.  Blood pressure:     125/85  Patient status:  Outpatient.  Study date:  Study date: 05/01/2017. Study time: 11:05  AM.  Location:  Echo laboratory.   -------------------------------------------------------------------   -------------------------------------------------------------------  Left ventricle:  Normal GLSS at -19.2% The cavity size was normal.  Systolic function was normal. The estimated ejection fraction was  in the range of 55% to 60%. Wall motion was normal; there were no  regional wall motion abnormalities. The transmitral flow pattern  was normal. The deceleration time of the early transmitral flow  velocity was normal. The pulmonary vein flow pattern was normal.  The tissue Doppler parameters were normal. Left ventricular  diastolic function parameters were normal.   -------------------------------------------------------------------  Aortic valve:   Trileaflet; normal thickness leaflets. Mobility was  not restricted.  Doppler:  Transvalvular velocity was within the  normal range. There was no stenosis. There was no regurgitation.     -------------------------------------------------------------------  Aorta:  Aortic root: The aortic root was normal in size.   -------------------------------------------------------------------  Mitral valve:   Structurally normal valve.   Mobility was not  restricted.  Doppler:  Transvalvular velocity was within the normal  range. There was no evidence for stenosis. There was trivial  regurgitation.    Peak gradient (D): 4 mm Hg.   -------------------------------------------------------------------  Left atrium:  The atrium was normal in size.   -------------------------------------------------------------------   Right ventricle:  The cavity size was normal. Wall thickness was  normal. Systolic function was normal.   -------------------------------------------------------------------  Pulmonic valve:    Structurally normal valve.   Cusp separation was  normal.  Doppler:  Transvalvular velocity was within the normal  range. There was no evidence for stenosis. There was trivial  regurgitation.   -------------------------------------------------------------------  Tricuspid valve:   Structurally normal valve.    Doppler:  Transvalvular velocity was within the normal range. There was  trivial regurgitation.   -------------------------------------------------------------------  Pulmonary artery:   The main pulmonary artery was normal-sized.  Systolic pressure could not be accurately estimated.   -------------------------------------------------------------------  Right atrium:  The atrium was normal in size.   -------------------------------------------------------------------  Pericardium:  There was no pericardial effusion.   -------------------------------------------------------------------  Systemic veins:  Inferior vena cava: The vessel was normal in size.   Cardiac event monitor 04/02/2020  No significant arrhythmias detected   3 days of data recorded on Zio monitor. Patient had a min HR of 54 bpm, max HR of 156 bpm, and avg HR of 96 bpm. Predominant underlying rhythm was Sinus Rhythm. No VT, SVT, atrial fibrillation, high degree block, or pauses noted. Isolated atrial and ventricular ectopy was rare (<1%). There were 0 triggered events. No significant arrhythmias detected.  Assessment & Plan   1.  Tachycardia-EKG today shows sinus tachycardia possible left atrial enlargement 113 bpm.  Denies recent episodes of accelerated or irregular heartbeats.  Denies palpitations. Continue to monitor Heart healthy low-sodium diet Increase physical activity as tolerated Avoid triggers caffeine,  chocolate, EtOH, dehydration etc.  Hypothyroidism-05/20/2020 TSH 1.29 and free T4 1.45. Continue Synthroid Follows with PCP Increase physical activity as tolerated  Hypertriglyceridemia-triglycerides 218 on 05/20/2020.  Wishes to defer further lipid screening at this time. Heart healthy low-sodium high-fiber diet   Disposition: Follow-up with Dr. Gardiner Rhyme in 12 months.   Jossie Ng. Chauntay Paszkiewicz NP-C    08/10/2021, 4:15 PM [facility] [place] [address] Office [phone] Fax [phone]  Notice: This dictation was prepared with Dragon dictation along with smaller phrase technology. Any transcriptional errors that result from this process are unintentional and may not be corrected upon review.  I spent 13 minutes examining this patient, reviewing medications, and using patient centered shared decision making involving her  cardiac care.  Prior to her visit I spent greater than 20 minutes reviewing her past medical history,  medications, and prior cardiac tests.

## 2021-08-10 ENCOUNTER — Other Ambulatory Visit: Payer: Self-pay

## 2021-08-10 ENCOUNTER — Ambulatory Visit: Payer: Commercial Managed Care - PPO | Admitting: General Practice

## 2021-08-10 ENCOUNTER — Encounter: Payer: Self-pay | Admitting: General Practice

## 2021-08-10 VITALS — BP 124/78 | HR 113 | Ht 67.0 in | Wt 243.8 lb

## 2021-08-10 DIAGNOSIS — E039 Hypothyroidism, unspecified: Secondary | ICD-10-CM | POA: Diagnosis not present

## 2021-08-10 DIAGNOSIS — Z13220 Encounter for screening for lipoid disorders: Secondary | ICD-10-CM | POA: Diagnosis not present

## 2021-08-10 DIAGNOSIS — R000 Tachycardia, unspecified: Secondary | ICD-10-CM

## 2021-08-10 NOTE — Patient Instructions (Signed)
Medication Instructions:  The current medical regimen is effective;  continue present plan and medications as directed. Please refer to the Current Medication list given to you today.  *If you need a refill on your cardiac medications before your next appointment, please call your pharmacy*  Lab Work:   Testing/Procedures:  NONE    NONE  Special Instructions  TAKE AND LOG YOUR HEART RATE/PULSE 2-3 TIMES A MONTH AND IF YOU HAVE SYMPTOMS  PLEASE AVOID SWEETS, SIMPLE CARBS, WHITE RICE...  Please try to avoid these triggers: Do not use any products that have nicotine or tobacco in them. These include cigarettes, e-cigarettes, and chewing tobacco. If you need help quitting, ask your doctor. Eat heart-healthy foods. Talk with your doctor about the right eating plan for you. Exercise regularly as told by your doctor. Stay hydrated Do not drink alcohol, Caffeine or chocolate. Lose weight if you are overweight. Do not use drugs, including cannabis   Follow-Up: Your next appointment:  12 month(s) In Person with You may see Donato Heinz, MD IF UNAVAILABLE Coletta Memos, FNP-C or one of the following Advanced Practice Providers on your designated Care Team:   Rosaria Ferries, PA-C Caron Presume, PA-C Jory Sims, DNP, ANP   Please call our office 2 months in advance to schedule this appointment   At [facility], you and your health needs are our priority.  As part of our continuing mission to provide you with exceptional heart care, we have created designated Provider Care Teams.  These Care Teams include your primary Cardiologist (physician) and Advanced Practice Providers (APPs -  Physician Assistants and Nurse Practitioners) who all work together to provide you with the care you need, when you need it.

## 2021-10-22 ENCOUNTER — Ambulatory Visit: Payer: Commercial Managed Care - PPO | Admitting: Hematology and Oncology

## 2021-10-29 ENCOUNTER — Inpatient Hospital Stay: Payer: Commercial Managed Care - PPO | Admitting: Hematology and Oncology

## 2021-11-30 ENCOUNTER — Other Ambulatory Visit: Payer: Self-pay | Admitting: Hematology and Oncology

## 2021-11-30 NOTE — Progress Notes (Signed)
Patient Care Team: [name], MD as PCP - General (Obstetrics and Gynecology) Donato Heinz, MD as PCP - Cardiology (Cardiology) Delice Bison, Charlestine Massed, NP as Nurse Practitioner (Hematology and Oncology) Nicholas Lose, MD as Consulting Physician (Hematology and Oncology) Irene Limbo, MD as Consulting Physician (Plastic Surgery) Gery Pray, MD as Consulting Physician (Radiation Oncology) Rolm Bookbinder, MD as Consulting Physician (General Surgery)  DIAGNOSIS:    ICD-10-CM   1. Malignant neoplasm of upper-inner quadrant of right breast in female, estrogen receptor positive ([place])  C50.211    Z17.0       SUMMARY OF ONCOLOGIC HISTORY: Oncology History  Breast cancer of upper-inner quadrant of right female breast ([place])  01/21/2016 Initial Diagnosis   Right breast biopsy: Invasive lobular cancer, ER 95%, PR 95%, Ki-67 5%, HER-2 positive ratio 2.52   02/01/2016 Breast MRI   Right breast: Post biopsy hematoma measuring 1.9 x 1.3 x 2.5 cm surrounding ring of reactive enhancement, no lymphadenopathy. T2 N0 stage II a clinical stage   03/01/2016 Surgery   Right lumpectomy: ILC, grade 2, 0.8 cm, ALH, additional superior margin : ILC grade to 0.3 cm , positive superior margin , 0/1 LN neg, mammoplasty bil : benign, ER 95%, PR 95%, HER-2 pos, Ki-67 5%, T1bN0 stage IA   03/15/2016 Surgery   Right breast. Margin excision: Benign   04/20/2016 - 04/17/2017 Chemotherapy   Taxol Herceptin weekly 12 followed by Herceptin maintenance for 1 year   08/10/2016 - 09/16/2016 Radiation Therapy   Adjuvant radiation therapy   11/14/2016 -  Anti-estrogen oral therapy   Tamoxifen 20 mg daily     CHIEF COMPLIANT: Follow-up of right breast cancer  INTERVAL HISTORY: Janet King is a 56 y.o. with above-mentioned history of right breast cancer treated with lumpectomy, adjuvant chemotherapy, radiation, and who is currently on tamoxifen. Mammogram on 05/12/2021 showed no evidence  of malignancy bilaterally. She presents to the clinic today for annual follow-up.  She is tolerating tamoxifen extremely well without any problems or concerns.  ALLERGIES:  is allergic to adhesive [tape] and penicillin g.  MEDICATIONS:  Current Outpatient Medications  Medication Sig Dispense Refill   levothyroxine (SYNTHROID, LEVOTHROID) 137 MCG tablet Take 1 tablet (137 mcg total) by mouth daily before breakfast.     tamoxifen (NOLVADEX) 20 MG tablet TAKE 1 TABLET(20 MG) BY MOUTH DAILY 90 tablet 3   Vitamin D, Ergocalciferol, (DRISDOL) 50000 units CAPS capsule TK ONE C PO TWICE A WEEK  4   No current facility-administered medications for this visit.    PHYSICAL EXAMINATION: ECOG PERFORMANCE STATUS: 1 - Symptomatic but completely ambulatory  Vitals:   12/01/21 1056  BP: 137/82  Pulse: (!) 118  Resp: 17  Temp: 97.7 F (36.5 C)  SpO2: 98%   Filed Weights   12/01/21 1056  Weight: 232 lb 1 oz (105.3 kg)    BREAST: No palpable masses or nodules in either right or left breasts. No palpable axillary supraclavicular or infraclavicular adenopathy no breast tenderness or nipple discharge. (exam performed in the presence of a chaperone)  LABORATORY DATA:  I have reviewed the data as listed CMP Latest Ref Rng & Units 06/13/2018 04/17/2017 03/06/2017  Glucose 70 - 99 mg/dL 143(H) 138 92  BUN 6 - 20 mg/dL 19 13.7 16.5  Creatinine 0.44 - 1.00 mg/dL 1.06(H) 0.9 0.8  Sodium 135 - 145 mmol/L 140 143 142  Potassium 3.5 - 5.1 mmol/L 4.1 4.0 4.0  Chloride 98 - 111 mmol/L 110 - -  CO2 22 - 32 mmol/L '23 25 25  ' Calcium 8.9 - 10.3 mg/dL 9.2 9.0 8.9  Total Protein 6.5 - 8.1 g/dL 7.5 7.1 7.0  Total Bilirubin 0.3 - 1.2 mg/dL 0.4 0.48 0.26  Alkaline Phos 38 - 126 U/L 69 80 69  AST 15 - 41 U/L '24 13 15  ' ALT 0 - 44 U/L '23 15 13    ' Lab Results  Component Value Date   WBC 9.2 06/13/2018   HGB 12.7 06/13/2018   HCT 38.6 06/13/2018   MCV 86.5 06/13/2018   PLT 202 06/13/2018   NEUTROABS 5.0  06/13/2018    ASSESSMENT & PLAN:  Breast cancer of upper-inner quadrant of right female breast ([place]) Right lumpectomy 03/01/2016: ILC, grade 2, 0.8 cm, ALH, additional superior margin : ILC grade to 0.3 cm , positive superior margin , 0/1 LN neg, mammoplasty bil : benign, ER 95%, PR 95%, HER-2 positive on primary tumor and negative on secondary mass which had a ratio of 1.55, gene copy number is 2.25, Ki-67 5%, T1bN0 stage IA   HER-2: HER-2 was positive in the original tumor and the HER-2 on the second excision was negative. I was able to get this clarification from pathology.  Reexcision of superior margin: 5 2017: Benign   Treatment plan: 1. Adjuvant therapy with Taxol and Herceptin weekly 12  completed 07/06/2016 2. Followed by Herceptin maintenance every 3 weeks 1 year (was on hold 06/29/2016 to 08/08/2016 due to a decrease in ejection fraction) completed June 2018 2. Followed adjuvant radiation 08/10/2016 to 09/16/2016 3. Antiestrogen therapy With tamoxifen to start January 2018 (patient's last menstrual cycle was June 2017) ----------------------------------------------------------------------------------------------------------------------------- Current treatment: Tamoxifen 20 mg daily started January 2018 (10 years recommended) We discussed the role of breast cancer index and patient is fine to continuing this medication but because she does not have any side effects.  Tamoxifen Toxicities: No further problems with hot flashes or myalgias.   Breast cancer surveillance: 1. Mammogram 05/13/2021: No evidence of malignancy breast density category C 2. Breast exam 12/01/21: Benign   She has been busy doing event management.  Currently everything is online and she has been working from home.   Return to clinic in 1 year for follow-up    No orders of the defined types were placed in this encounter.  The patient has a good understanding of the overall plan. she agrees with it. she  will call with any problems that may develop before the next visit here.  Total time spent: 20 mins including face to face time and time spent for planning, charting and coordination of care  Rulon Eisenmenger, MD, MPH 12/01/2021  I, Thana Ates, am acting as scribe for Dr. Nicholas Lose.  I have reviewed the above documentation for accuracy and completeness, and I agree with the above.

## 2021-12-01 ENCOUNTER — Other Ambulatory Visit: Payer: Self-pay

## 2021-12-01 ENCOUNTER — Inpatient Hospital Stay: Payer: Commercial Managed Care - PPO | Attending: Hematology and Oncology | Admitting: Hematology and Oncology

## 2021-12-01 DIAGNOSIS — Z79810 Long term (current) use of selective estrogen receptor modulators (SERMs): Secondary | ICD-10-CM | POA: Insufficient documentation

## 2021-12-01 DIAGNOSIS — Z170 Estrogen receptor positive status [ER+]: Secondary | ICD-10-CM | POA: Diagnosis not present

## 2021-12-01 DIAGNOSIS — C50211 Malignant neoplasm of upper-inner quadrant of right female breast: Secondary | ICD-10-CM | POA: Insufficient documentation

## 2021-12-01 NOTE — Assessment & Plan Note (Signed)
Right lumpectomy 03/01/2016: ILC, grade 2, 0.8 cm, ALH, additional superior margin : ILC grade to 0.3 cm , positive superior margin , 0/1 LN neg, mammoplasty bil : benign, ER 95%, PR 95%, HER-2 positive on primary tumor and negative on secondary mass which had a ratio of 1.55, gene copy number is 2.25, Ki-67 5%, T1bN0 stage IA  HER-2: HER-2 was positive in the original tumor and the HER-2 on the second excision was negative. I was able to get this clarification from pathology.  Reexcision of superior margin: 5 2017: Benign  Treatment plan: 1. Adjuvant therapy with Taxol and Herceptin weekly 12 completed 07/06/2016 2. Followed by Herceptin maintenance every 3 weeks 1 year (was on hold 06/29/2016 to 08/08/2016 due to a decrease in ejection fraction)completed June 2018 2. Followedadjuvant radiation 08/10/2016 to 09/16/2016 3. Antiestrogen therapy With tamoxifen to start January 2018 (patient's last menstrual cycle was June 2017) ----------------------------------------------------------------------------------------------------------------------------- Current treatment:Tamoxifen 20 mg daily started January 2018 (10 years recommended)  Tamoxifen Toxicities: No further problems with hot flashes or myalgias.  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  Return to clinic in 1 year for follow-up

## 2022-03-16 ENCOUNTER — Ambulatory Visit: Payer: Commercial Managed Care - PPO | Admitting: Plastic Surgery

## 2022-03-16 ENCOUNTER — Encounter: Payer: Self-pay | Admitting: Plastic Surgery

## 2022-03-16 DIAGNOSIS — Z170 Estrogen receptor positive status [ER+]: Secondary | ICD-10-CM | POA: Diagnosis not present

## 2022-03-16 DIAGNOSIS — C50211 Malignant neoplasm of upper-inner quadrant of right female breast: Secondary | ICD-10-CM

## 2022-03-16 NOTE — Progress Notes (Signed)
? ?  Referring Provider ?Janet Pearson, MD ?[address], [address] ?[place],  [place] [address]  ? ?CC:  ?Chief Complaint  ?Patient presents with  ? Follow-up  ?   ? ?Janet King is an 56 y.o. female.  ?HPI: Patient presents to discuss revision breast surgery.  I had seen her back in August and we discussed a revision to her reconstruction.  We discussed release of periareolar scar on the right side with placement of a dermal fat graft beneath it.  We also discussed excising some inferior breast skin to try and pull the areola down a centimeter to.  We also discussed liposuction in the right axilla to debulk that area.  Additionally we planned a left-sided reduction to raise the areola on that side and to help the volume match the right side a bit better.  This would be combined with excision of left axillary breast tissue.  She is thought about it and would like to move forward with the above. ? ?Review of Systems ?General: Denies fevers and chills ? ?Physical Exam ? ?  12/01/2021  ? 10:56 AM 08/10/2021  ?  3:38 PM 10/23/2020  ? 10:25 AM  ?Vitals with BMI  ?Height '5\' 7"'$  '5\' 7"'$  '5\' 7"'$   ?Weight 232 lbs 1 oz 243 lbs 13 oz 250 lbs 2 oz  ?BMI 36.34 38.18 39.16  ?Systolic 371 696 789  ?Diastolic 82 78 84  ?Pulse 118 113 111  ?  ?General:  No acute distress,  Alert and oriented, Non-Toxic, Normal speech and affect ?Breast: Exam unchanged from previous. ? ?Assessment/Plan ?Patient I went through the plan again and are in agreement.  We will try and release the tethered right areola and place a subdermal fat graft.  We will remove some right breast tissue inferiorly to try and redistribute the areola a bit lower and perform right axillary liposuction.  The left axillary breast tissue will be removed in addition to a revision reduction on the left to help with symmetry.  Patient's fully aware of the risks include bleeding, infection, damage to surrounding structures and need for additional procedures.  All of her  questions were answered and we will plan to move forward. ? ?Cindra Presume ?03/16/2022, 1:09 PM  ? ? ?    ?

## 2022-04-21 ENCOUNTER — Telehealth: Payer: Self-pay | Admitting: Plastic Surgery

## 2022-04-21 NOTE — Telephone Encounter (Signed)
LVM to discuss surgery date 

## 2022-04-25 ENCOUNTER — Other Ambulatory Visit: Payer: Self-pay | Admitting: Obstetrics and Gynecology

## 2022-04-25 DIAGNOSIS — R928 Other abnormal and inconclusive findings on diagnostic imaging of breast: Secondary | ICD-10-CM

## 2022-04-27 ENCOUNTER — Ambulatory Visit
Admission: RE | Admit: 2022-04-27 | Discharge: 2022-04-27 | Disposition: A | Discharge status: Home / Self Care | Payer: Commercial Managed Care - PPO | Source: Ambulatory Visit | Attending: Obstetrics and Gynecology | Admitting: Obstetrics and Gynecology

## 2022-04-27 ENCOUNTER — Other Ambulatory Visit: Payer: Self-pay | Admitting: Obstetrics and Gynecology

## 2022-04-27 DIAGNOSIS — R921 Mammographic calcification found on diagnostic imaging of breast: Secondary | ICD-10-CM

## 2022-04-27 DIAGNOSIS — R928 Other abnormal and inconclusive findings on diagnostic imaging of breast: Secondary | ICD-10-CM

## 2022-04-28 ENCOUNTER — Ambulatory Visit
Admission: RE | Admit: 2022-04-28 | Discharge: 2022-04-28 | Disposition: A | Discharge status: Home / Self Care | Payer: Commercial Managed Care - PPO | Source: Ambulatory Visit | Attending: Obstetrics and Gynecology | Admitting: Obstetrics and Gynecology

## 2022-04-28 DIAGNOSIS — R921 Mammographic calcification found on diagnostic imaging of breast: Secondary | ICD-10-CM

## 2022-05-12 NOTE — Progress Notes (Signed)
Patient ID: Janet King, female    DOB: 1966-09-16, 56 y.o.   MRN: [id number]  Chief Complaint  Patient presents with   Pre-op Exam      ICD-10-CM   1. Postoperative breast asymmetry  N64.89        History of Present Illness: Janet King is a 56 y.o.  female  with a history of right-sided breast cancer with right-sided lumpectomy and bilateral oncoplastic reduction and right breast radiation.  She presents for preoperative evaluation for upcoming procedure, revision breast reconstruction with scar release and placement of dermal fat graft, scheduled for 05/27/2022 with Dr. Claudia Desanctis.  The patient has not had problems with anesthesia.  She does report that she has a history of elevated heart rate for which she has been seen by cardiology.  She reports that her echocardiograms have been normal and that a Holter monitor was without evidence of arrhythmia.  She is elevated at 125 bpm here in the office today, but states that she is feeling well and that it is only minimally elevated compared to her baseline.  She does not take any blood thinners.  No personal or family history of DVT or clotting disorder.  Patient also tells me that she recently had mammogram with clips placed on medial aspect of right breast.  Patient has already spoken with Dr. Lindi Adie and plans to hold her chemotherapy perioperatively.  Discussed 2 weeks before and after surgery.  She tells me that she discussed and no drains with Dr. Claudia Desanctis.  She does not particularly care about breast size, but rather symmetry postoperatively.  She will hold her vitamin D 1 week prior to surgery.  She does endorse intermittent tape allergy.  Summary of Previous Visit: She initially met with Dr. Claudia Desanctis on 07/02/2021 for consult.  At that time, expressed that she had healing difficulties vertically around the areola on the right side.  She then developed a sinus tract which ultimately required surgical excision and closure.  She has been  dissatisfied with her post-operative subsequent symmetry.  Right NAC is higher than the left.  There is also a size discrepancy with the left side being larger.  She then complained of axillary discomfort and excess tissue.  Discussed plan including release of periareolar scar on right side with placement of dermal fat graft beneath it.  Also discussed excising some inferior breast skin to try and pull down the areola as well as liposuction in right axilla to debulk the area.  Then, discussed left-sided reduction to raise areola and to help the volume match the right side a bit more.  This would be combined with excision of left axillary breast tissue.  She expressed understanding of the risks and benefits and would like to proceed with surgery.  Job: VP event planner position.  She requests 1 week off from work completely followed by 1 week working from home.  Afterwards, she would feel comfortable returning to work with lifting restrictions.  PMH Significant for: Right-sided breast cancer with subsequent lumpectomy and bilateral oncoplastic reduction, right-sided radiation, thyroid disorder well-controlled, vitamin D deficiency.   Past Medical History: Allergies: Allergies  Allergen Reactions   Adhesive [Tape] Other (See Comments)    Redness    Penicillin G Rash    Current Medications:  Current Outpatient Medications:    levothyroxine (SYNTHROID, LEVOTHROID) 137 MCG tablet, Take 1 tablet (137 mcg total) by mouth daily before breakfast., Disp: , Rfl:    tamoxifen (NOLVADEX) 20 MG  tablet, TAKE 1 TABLET(20 MG) BY MOUTH DAILY, Disp: 90 tablet, Rfl: 3   Vitamin D, Ergocalciferol, (DRISDOL) 50000 units CAPS capsule, TK ONE C PO TWICE A WEEK, Disp: , Rfl: 4  Past Medical Problems: Past Medical History:  Diagnosis Date   Baker's cyst of knee    Breast cancer ([place])    right   H/O bone density study    History of radiation therapy 08/10/16-09/16/16   right breast 50.4 Gy   Hypothyroidism     Personal history of chemotherapy    Personal history of radiation therapy     Past Surgical History: Past Surgical History:  Procedure Laterality Date   BREAST BIOPSY     BREAST LUMPECTOMY     invasive carcinoma right april 2017   BREAST RECONSTRUCTION Bilateral 03/01/2016   Procedure: RIGHT ONCOPLASTIC BREAST RECONSTRUCTION WITH LEFT REDUCTION FOR SYMMETRY;  Surgeon: Irene Limbo, MD;  Location: [facility];  Service: Plastics;  Laterality: Bilateral;   EXCISION OF BREAST LESION Right 05/09/2017   Procedure: EXCISION OF BREAST LESION;  Surgeon: Irene Limbo, MD;  Location: [facility];  Service: Plastics;  Laterality: Right;   PERIPHERAL VASCULAR CATHETERIZATION Right 03/01/2016   Procedure: PORTA CATH INSERTION;  Surgeon: Rolm Bookbinder, MD;  Location: [facility];  Service: General;  Laterality: Right;   PORT-A-CATH REMOVAL Right 05/09/2017   Procedure: REMOVAL PORT-A-CATH AND EXCISION OF ULCER RIGHT BREAST;  Surgeon: Irene Limbo, MD;  Location: [facility];  Service: Plastics;  Laterality: Right;   RADIOACTIVE SEED GUIDED PARTIAL MASTECTOMY WITH AXILLARY SENTINEL LYMPH NODE BIOPSY Right 03/01/2016   Procedure: RADIOACTIVE SEED GUIDED PARTIAL MASTECTOMY WITH AXILLARY SENTINEL LYMPH NODE BIOPSY;  Surgeon: Rolm Bookbinder, MD;  Location: [facility];  Service: General;  Laterality: Right;   RE-EXCISION OF BREAST CANCER,SUPERIOR MARGINS Right 03/15/2016   Procedure: RE-EXCISION OF BREAST CANCER, SUPERIOR MARGINS;  Surgeon: Rolm Bookbinder, MD;  Location: [facility];  Service: General;  Laterality: Right;   REDUCTION MAMMAPLASTY     bilateral 2017   TONSILLECTOMY      Social History: Social History   Socioeconomic History   Marital status: Single    Spouse name: Not on file   Number of children: 0   Years of education: Not on file   Highest education level: Not on file  Occupational  History   Occupation: Trade Psychologist, counselling  Tobacco Use   Smoking status: Never   Smokeless tobacco: Never  Substance and Sexual Activity   Alcohol use: Yes    Alcohol/week: 0.0 standard drinks of alcohol    Comment: occ - once every 3 mos   Drug use: No   Sexual activity: Not Currently  Other Topics Concern   Not on file  Social History Narrative   Not on file   Social Determinants of Health   Financial Resource Strain: Not on file  Food Insecurity: Not on file  Transportation Needs: Not on file  Physical Activity: Not on file  Stress: Not on file  Social Connections: Not on file  Intimate Partner Violence: Not on file    Family History: Family History  Problem Relation Age of Onset   Breast cancer Mother 93   Breast cancer Sister    Heart disease Maternal Grandfather    Colon cancer Paternal Grandmother        dx. 73s   Cancer Other        maternal great aunt (MGM's sister)  dx. with NOS cancer at older age   Breast cancer Other        maternal great aunt (MGF's sister) dx. in her 35s; s/p mastectomy   Cancer Other        paternal great aunt (PGM's sister) dx. with NOS cancer at older age    Review of Systems: ROS Denies chest pain, difficulty breathing, leg swelling, or fevers.  Physical Exam: Vital Signs LMP 04/20/2016   Physical Exam Constitutional:      General: Not in acute distress.    Appearance: Normal appearance. Not ill-appearing.  HENT:     Head: Normocephalic and atraumatic.  Eyes:     Pupils: Pupils are equal, round. Cardiovascular:     Rate and Rhythm: Normal rate.    Pulses: Normal pulses.  Pulmonary:     Effort: No respiratory distress or increased work of breathing.  Speaks in full sentences. Abdominal:     General: Abdomen is flat. No distension.   Musculoskeletal: Normal range of motion. No lower extremity swelling or edema. No varicosities. Skin:    General: Skin is warm and dry.     Findings: No erythema or rash.   Neurological:     Mental Status: Alert and oriented to person, place, and time.  Psychiatric:        Mood and Affect: Mood normal.        Behavior: Behavior normal.    Assessment/Plan: The patient is scheduled for revision breast reconstruction with scar release and placement of dermal fat graft with Dr. Claudia Desanctis.  Risks, benefits, and alternatives of procedure discussed, questions answered and consent obtained.    Smoking Status: Non-smoker. Last Mammogram: Diagnostic unilateral right on 04/28/2022; Results: Stromal fibrosis and microcalcifications right breast, ribbon clip placed.  Last bilateral mammogram was 04/2021, BI-RADS Category 1: negative.  Caprini Score: 6; Risk Factors include: Age, BMI greater than 25, history of breast cancer, and length of planned surgery. Recommendation for mechanical prophylaxis. Encourage early ambulation.   Pictures obtained: 07/02/2022  Post-op Rx sent to pharmacy: Zofran. She adamantly declined narcotic analgesics and tells me that she has residual narcotic pain pills from a previous encounter still at home in the event she experiences breakthrough pain.  Patient was provided with the General Surgical Risk consent document and Pain Medication Agreement prior to their appointment.  They had adequate time to read through the risk consent documents and Pain Medication Agreement. We also discussed them in person together during this preop appointment. All of their questions were answered to their satisfaction.  Recommended calling if they have any further questions.  Risk consent form and Pain Medication Agreement to be scanned into patient's chart.  The risk that can be encountered with breast reduction were discussed and include the following but not limited to these:  Breast asymmetry, fluid accumulation, firmness of the breast, inability to breast feed, loss of nipple or areola, skin loss, decrease or no nipple sensation, fat necrosis of the breast tissue,  bleeding, infection, healing delay.  There are risks of anesthesia, changes to skin sensation and injury to nerves or blood vessels.  The muscle can be temporarily or permanently injured.  You may have an allergic reaction to tape, suture, glue, blood products which can result in skin discoloration, swelling, pain, skin lesions, poor healing.  Any of these can lead to the need for revisonal surgery or stage procedures.  A reduction has potential to interfere with diagnostic procedures.  Nipple or breast piercing can increase risks  of infection.  This procedure is best done when the breast is fully developed.  Changes in the breast will continue to occur over time.  Pregnancy can alter the outcomes of previous breast reduction surgery, weight gain and weigh loss can also effect the long term appearance.    Electronically signed by: Krista Blue, PA-C 05/12/2022 3:19 PM

## 2022-05-13 ENCOUNTER — Other Ambulatory Visit: Payer: Self-pay

## 2022-05-13 ENCOUNTER — Encounter (HOSPITAL_BASED_OUTPATIENT_CLINIC_OR_DEPARTMENT_OTHER): Payer: Self-pay | Admitting: Plastic Surgery

## 2022-05-16 ENCOUNTER — Ambulatory Visit (INDEPENDENT_AMBULATORY_CARE_PROVIDER_SITE_OTHER): Payer: Commercial Managed Care - PPO | Admitting: Physician Assistant

## 2022-05-16 VITALS — BP 120/70 | HR 125 | Ht 67.0 in | Wt 225.0 lb

## 2022-05-16 DIAGNOSIS — N6489 Other specified disorders of breast: Secondary | ICD-10-CM

## 2022-05-16 MED ORDER — ONDANSETRON 4 MG PO TBDP: 4.0000 mg | ORAL_TABLET | Freq: Three times a day (TID) | ORAL | 0 refills | Status: DC | PRN

## 2022-05-19 ENCOUNTER — Telehealth: Payer: Self-pay | Admitting: Plastic Surgery

## 2022-05-19 NOTE — Telephone Encounter (Signed)
Called insurance to determine why surgery authorization was voided for CPT 15836, [phone], [phone] and CSR Judson Roch advised no prior authorization required and no pre determination recommended based on patient's plan. Ref # [id number].

## 2022-05-20 ENCOUNTER — Telehealth: Payer: Self-pay | Admitting: Plastic Surgery

## 2022-05-20 DIAGNOSIS — Z719 Counseling, unspecified: Secondary | ICD-10-CM

## 2022-05-20 NOTE — Telephone Encounter (Signed)
Pt came in and dropped off her FMLA forms to be completed by the provider.  Pt paid the full form fee of $25.00 and she is aware that is will take anywhere from 7-10 business day to complete.  Form was scanned in to [facility] and original was given to her to have completed.

## 2022-05-21 ENCOUNTER — Telehealth: Payer: Self-pay | Admitting: Plastic Surgery

## 2022-05-21 NOTE — Telephone Encounter (Signed)
Left message for pt to call office

## 2022-05-21 NOTE — Telephone Encounter (Signed)
Left message for pt to call office today or Monday regarding her surgery scheduled for 7/14 which will need to be rescheduled.

## 2022-05-21 NOTE — Telephone Encounter (Signed)
Spoke with patient regarding surgery being cancelled for 7/14 and offered an appointment with Dr. Erin Hearing for consultation.  Patient wants to think things over and will call the office.

## 2022-05-27 ENCOUNTER — Ambulatory Visit (HOSPITAL_BASED_OUTPATIENT_CLINIC_OR_DEPARTMENT_OTHER)
Admission: RE | Admit: 2022-05-27 | Payer: Commercial Managed Care - PPO | Source: Home / Self Care | Admitting: Plastic Surgery

## 2022-05-27 SURGERY — RECONSTRUCTION, BREAST
Anesthesia: General | Site: Breast | Laterality: Right

## 2022-06-02 ENCOUNTER — Encounter: Payer: Commercial Managed Care - PPO | Admitting: Plastic Surgery

## 2022-09-07 ENCOUNTER — Other Ambulatory Visit: Payer: Self-pay | Admitting: Endocrinology

## 2022-09-07 DIAGNOSIS — E049 Nontoxic goiter, unspecified: Secondary | ICD-10-CM

## 2022-09-28 ENCOUNTER — Other Ambulatory Visit: Payer: Self-pay | Admitting: Hematology and Oncology

## 2022-09-28 DIAGNOSIS — R921 Mammographic calcification found on diagnostic imaging of breast: Secondary | ICD-10-CM

## 2022-11-03 ENCOUNTER — Other Ambulatory Visit: Payer: Self-pay | Admitting: Hematology and Oncology

## 2022-11-03 ENCOUNTER — Ambulatory Visit
Admission: RE | Admit: 2022-11-03 | Discharge: 2022-11-03 | Disposition: A | Discharge status: Home / Self Care | Payer: Commercial Managed Care - PPO | Source: Ambulatory Visit | Attending: Hematology and Oncology | Admitting: Hematology and Oncology

## 2022-11-03 DIAGNOSIS — R921 Mammographic calcification found on diagnostic imaging of breast: Secondary | ICD-10-CM

## 2022-11-30 ENCOUNTER — Ambulatory Visit
Admission: RE | Admit: 2022-11-30 | Discharge: 2022-11-30 | Disposition: A | Discharge status: Home / Self Care | Payer: Commercial Managed Care - PPO | Source: Ambulatory Visit | Attending: Endocrinology | Admitting: Endocrinology

## 2022-11-30 DIAGNOSIS — E049 Nontoxic goiter, unspecified: Secondary | ICD-10-CM

## 2022-12-01 ENCOUNTER — Ambulatory Visit: Payer: Commercial Managed Care - PPO | Admitting: Hematology and Oncology

## 2022-12-01 ENCOUNTER — Other Ambulatory Visit: Payer: Self-pay | Admitting: Hematology and Oncology

## 2022-12-13 NOTE — Assessment & Plan Note (Signed)
Right lumpectomy 03/01/2016: ILC, grade 2, 0.8 cm, ALH, additional superior margin : ILC grade to 0.3 cm , positive superior margin , 0/1 LN neg, mammoplasty bil : benign, ER 95%, PR 95%, HER-2 positive on primary tumor and negative on secondary mass which had a ratio of 1.55, gene copy number is 2.25, Ki-67 5%, T1bN0 stage IA   HER-2: HER-2 was positive in the original tumor and the HER-2 on the second excision was negative. I was able to get this clarification from pathology.  Reexcision of superior margin: 5 2017: Benign   Treatment plan: 1. Adjuvant therapy with Taxol and Herceptin weekly 12  completed 07/06/2016 2. Followed by Herceptin maintenance every 3 weeks 1 year (was on hold 06/29/2016 to 08/08/2016 due to a decrease in ejection fraction) completed June 2018 2. Followed adjuvant radiation 08/10/2016 to 09/16/2016 3. Antiestrogen therapy With tamoxifen to start January 2018 (patient's last menstrual cycle was June 2017) ----------------------------------------------------------------------------------------------------------------------------- Current treatment: Tamoxifen 20 mg daily started January 2018 (10 years recommended) We discussed the role of breast cancer index and patient is fine to continuing this medication but because she does not have any side effects.   Tamoxifen Toxicities: No further problems with hot flashes or myalgias.   Breast cancer surveillance: 1. Mammogram 11/03/22 No evidence of malignancy breast density category B 2. Breast exam 12/14/22: Benign   She has been busy doing event management.  Currently everything is online and she has been working from home.   Return to clinic in 1 year for follow-up

## 2022-12-13 NOTE — Progress Notes (Signed)
Patient Care Team: [name], MD as PCP - General (Obstetrics and Gynecology) Donato Heinz, MD as PCP - Cardiology (Cardiology) Delice Bison, Charlestine Massed, NP as Nurse Practitioner (Hematology and Oncology) Nicholas Lose, MD as Consulting Physician (Hematology and Oncology) Irene Limbo, MD as Consulting Physician (Plastic Surgery) Gery Pray, MD as Consulting Physician (Radiation Oncology) Rolm Bookbinder, MD as Consulting Physician (General Surgery)  DIAGNOSIS: No diagnosis found.  SUMMARY OF ONCOLOGIC HISTORY: Oncology History  Breast cancer of upper-inner quadrant of right female breast ([place])  01/21/2016 Initial Diagnosis   Right breast biopsy: Invasive lobular cancer, ER 95%, PR 95%, Ki-67 5%, HER-2 positive ratio 2.52   02/01/2016 Breast MRI   Right breast: Post biopsy hematoma measuring 1.9 x 1.3 x 2.5 cm surrounding ring of reactive enhancement, no lymphadenopathy. T2 N0 stage II a clinical stage   03/01/2016 Surgery   Right lumpectomy: ILC, grade 2, 0.8 cm, ALH, additional superior margin : ILC grade to 0.3 cm , positive superior margin , 0/1 LN neg, mammoplasty bil : benign, ER 95%, PR 95%, HER-2 pos, Ki-67 5%, T1bN0 stage IA   03/15/2016 Surgery   Right breast. Margin excision: Benign   04/20/2016 - 04/17/2017 Chemotherapy   Taxol Herceptin weekly 12 followed by Herceptin maintenance for 1 year   08/10/2016 - 09/16/2016 Radiation Therapy   Adjuvant radiation therapy   11/14/2016 -  Anti-estrogen oral therapy   Tamoxifen 20 mg daily     CHIEF COMPLIANT:   INTERVAL HISTORY: Janet King is a   ALLERGIES:  is allergic to adhesive [tape] and penicillin g.  MEDICATIONS:  Current Outpatient Medications  Medication Sig Dispense Refill   levothyroxine (SYNTHROID, LEVOTHROID) 137 MCG tablet Take 1 tablet (137 mcg total) by mouth daily before breakfast.     ondansetron (ZOFRAN-ODT) 4 MG disintegrating tablet Take 1 tablet (4 mg total) by  mouth every 8 (eight) hours as needed for nausea or vomiting. 20 tablet 0   tamoxifen (NOLVADEX) 20 MG tablet TAKE 1 TABLET(20 MG) BY MOUTH DAILY 90 tablet 3   Vitamin D, Ergocalciferol, (DRISDOL) 50000 units CAPS capsule TK ONE C PO TWICE A WEEK  4   No current facility-administered medications for this visit.    PHYSICAL EXAMINATION: ECOG PERFORMANCE STATUS: {CHL ONC ECOG PS:[id number]}  There were no vitals filed for this visit. There were no vitals filed for this visit.  BREAST:*** No palpable masses or nodules in either right or left breasts. No palpable axillary supraclavicular or infraclavicular adenopathy no breast tenderness or nipple discharge. (exam performed in the presence of a chaperone)  LABORATORY DATA:  I have reviewed the data as listed    Latest Ref Rng & Units 06/13/2018    2:13 PM 04/17/2017    3:01 PM 03/06/2017    3:00 PM  CMP  Glucose 70 - 99 mg/dL 143  138  92   BUN 6 - 20 mg/dL 19  13.7  16.5   Creatinine 0.44 - 1.00 mg/dL 1.06  0.9  0.8   Sodium 135 - 145 mmol/L 140  143  142   Potassium 3.5 - 5.1 mmol/L 4.1  4.0  4.0   Chloride 98 - 111 mmol/L 110     CO2 22 - 32 mmol/L '23  25  25   '$ Calcium 8.9 - 10.3 mg/dL 9.2  9.0  8.9   Total Protein 6.5 - 8.1 g/dL 7.5  7.1  7.0   Total Bilirubin 0.3 - 1.2 mg/dL 0.4  0.48  0.26   Alkaline Phos 38 - 126 U/L 69  80  69   AST 15 - 41 U/L '24  13  15   '$ ALT 0 - 44 U/L '23  15  13     '$ Lab Results  Component Value Date   WBC 9.2 06/13/2018   HGB 12.7 06/13/2018   HCT 38.6 06/13/2018   MCV 86.5 06/13/2018   PLT 202 06/13/2018   NEUTROABS 5.0 06/13/2018    ASSESSMENT & PLAN:  No problem-specific Assessment & Plan notes found for this encounter.    No orders of the defined types were placed in this encounter.  The patient has a good understanding of the overall plan. she agrees with it. she will call with any problems that may develop before the next visit here. Total time spent: 30 mins including face to face  time and time spent for planning, charting and co-ordination of care   Suzzette Righter, [place] 12/13/22    I Gardiner Coins am acting as a Education administrator for [employer]  ***

## 2022-12-14 ENCOUNTER — Inpatient Hospital Stay: Payer: Commercial Managed Care - PPO | Attending: Hematology and Oncology | Admitting: Hematology and Oncology

## 2022-12-14 ENCOUNTER — Other Ambulatory Visit: Payer: Self-pay

## 2022-12-14 VITALS — BP 131/87 | HR 106 | Temp 97.3°F | Resp 17 | Wt 219.0 lb

## 2022-12-14 DIAGNOSIS — Z79810 Long term (current) use of selective estrogen receptor modulators (SERMs): Secondary | ICD-10-CM | POA: Insufficient documentation

## 2022-12-14 DIAGNOSIS — Z170 Estrogen receptor positive status [ER+]: Secondary | ICD-10-CM | POA: Diagnosis not present

## 2022-12-14 DIAGNOSIS — C50211 Malignant neoplasm of upper-inner quadrant of right female breast: Secondary | ICD-10-CM | POA: Diagnosis present

## 2022-12-14 MED ORDER — LEVOTHYROXINE SODIUM 150 MCG PO TABS: 150.0000 ug | ORAL_TABLET | Freq: Every day | ORAL | Status: AC

## 2023-03-06 ENCOUNTER — Telehealth: Payer: Self-pay | Admitting: Hematology and Oncology

## 2023-03-06 NOTE — Telephone Encounter (Signed)
Patient called to reschedule year follow up appointment.

## 2023-05-08 ENCOUNTER — Ambulatory Visit
Admission: RE | Admit: 2023-05-08 | Discharge: 2023-05-08 | Disposition: A | Discharge status: Home / Self Care | Payer: Commercial Managed Care - PPO | Source: Ambulatory Visit | Attending: Hematology and Oncology | Admitting: Hematology and Oncology

## 2023-05-08 DIAGNOSIS — R921 Mammographic calcification found on diagnostic imaging of breast: Secondary | ICD-10-CM

## 2023-11-28 ENCOUNTER — Other Ambulatory Visit: Payer: Self-pay | Admitting: Hematology and Oncology

## 2023-12-14 ENCOUNTER — Ambulatory Visit: Payer: Commercial Managed Care - PPO | Admitting: Hematology and Oncology

## 2023-12-21 ENCOUNTER — Inpatient Hospital Stay: Payer: Commercial Managed Care - PPO | Attending: Hematology and Oncology | Admitting: Hematology and Oncology

## 2023-12-21 VITALS — BP 135/70 | HR 100 | Temp 97.9°F | Resp 18 | Ht 67.0 in | Wt 211.2 lb

## 2023-12-21 DIAGNOSIS — C50211 Malignant neoplasm of upper-inner quadrant of right female breast: Secondary | ICD-10-CM | POA: Diagnosis present

## 2023-12-21 DIAGNOSIS — Z79810 Long term (current) use of selective estrogen receptor modulators (SERMs): Secondary | ICD-10-CM | POA: Insufficient documentation

## 2023-12-21 DIAGNOSIS — Z170 Estrogen receptor positive status [ER+]: Secondary | ICD-10-CM | POA: Insufficient documentation

## 2023-12-21 MED ORDER — TAMOXIFEN CITRATE 20 MG PO TABS
20.0000 mg | ORAL_TABLET | Freq: Every day | ORAL | 3 refills | Status: AC
Start: 2023-12-21 — End: ?

## 2023-12-21 NOTE — Assessment & Plan Note (Signed)
 Right lumpectomy 03/01/2016: ILC, grade 2, 0.8 cm, ALH, additional superior margin : ILC grade to 0.3 cm , positive superior margin , 0/1 LN neg, mammoplasty bil : benign, ER 95%, PR 95%, HER-2 positive on primary tumor and negative on secondary mass which had a ratio of 1.55, gene copy number is 2.25, Ki-67 5%, T1bN0 stage IA   HER-2: HER-2 was positive in the original tumor and the HER-2 on the second excision was negative. I was able to get this clarification from pathology.  Reexcision of superior margin: 5 2017: Benign   Treatment plan: 1. Adjuvant therapy with Taxol  and Herceptin  weekly 12  completed 07/06/2016 2. Followed by Herceptin  maintenance every 3 weeks 1 year (was on hold 06/29/2016 to 08/08/2016 due to a decrease in ejection fraction) completed June 2018 2. Followed adjuvant radiation 08/10/2016 to 09/16/2016 3. Antiestrogen therapy With tamoxifen  to start January 2018 (patient's last menstrual cycle was June 2017) ----------------------------------------------------------------------------------------------------------------------------- Current treatment: Tamoxifen  20 mg daily started January 2018 (10 years recommended) We discussed the role of breast cancer index and patient is fine to continuing this medication but because she does not have any side effects.   Tamoxifen  Toxicities: No further problems with hot flashes or myalgias.   Breast cancer surveillance: 1. Mammogram 05/08/2023 no evidence of malignancy breast density category B 2. Breast exam 12/21/2023: Benign   She has been busy doing event management.     Return to clinic in 1 year for follow-up

## 2023-12-21 NOTE — Progress Notes (Signed)
 Patient Care Team: Latisha Medford, MD as PCP - General (Obstetrics and Gynecology) Kate Lonni CROME, MD as PCP - Cardiology (Cardiology) Crawford, Morna Pickle, NP as Nurse Practitioner (Hematology and Oncology) Odean Potts, MD as Consulting Physician (Hematology and Oncology) Arelia Filippo, MD as Consulting Physician (Plastic Surgery) Shannon Agent, MD as Consulting Physician (Radiation Oncology) Ebbie Cough, MD as Consulting Physician (General Surgery)  DIAGNOSIS:  Encounter Diagnosis  Name Primary?   Malignant neoplasm of upper-inner quadrant of right breast in female, estrogen receptor positive (HCC) Yes    SUMMARY OF ONCOLOGIC HISTORY: Oncology History  Breast cancer of upper-inner quadrant of right female breast (HCC)  01/21/2016 Initial Diagnosis   Right breast biopsy: Invasive lobular cancer, ER 95%, PR 95%, Ki-67 5%, HER-2 positive ratio 2.52   02/01/2016 Breast MRI   Right breast: Post biopsy hematoma measuring 1.9 x 1.3 x 2.5 cm surrounding ring of reactive enhancement, no lymphadenopathy. T2 N0 stage II a clinical stage   03/01/2016 Surgery   Right lumpectomy: ILC, grade 2, 0.8 cm, ALH, additional superior margin : ILC grade to 0.3 cm , positive superior margin , 0/1 LN neg, mammoplasty bil : benign, ER 95%, PR 95%, HER-2 pos, Ki-67 5%, T1bN0 stage IA   03/15/2016 Surgery   Right breast. Margin excision: Benign   04/20/2016 - 04/17/2017 Chemotherapy   Taxol  Herceptin  weekly 12 followed by Herceptin  maintenance for 1 year   08/10/2016 - 09/16/2016 Radiation Therapy   Adjuvant radiation therapy   11/14/2016 -  Anti-estrogen oral therapy   Tamoxifen  20 mg daily     CHIEF COMPLIANT: Follow-up on tamoxifen   HISTORY OF PRESENT ILLNESS:   History of Present Illness   Jaxie Racanelli is a 58 year old female with history of breast cancer who presents for a follow-up visit.  She was diagnosed with breast cancer in 2017 and has been on  tamoxifen  since January 2018. There are no changes in her condition, and she has not experienced hot flashes. She underwent chemotherapy as part of her treatment. She has been busy with work, which has been stressful, but she has not experienced any breast pain or discomfort. A recent mammogram showed her breast density has decreased to a B category.  She was recently diagnosed with diabetes and started insulin treatment three days ago. Her A1c level is 10. She did not notice any symptoms prior to the diagnosis and is unsure about her family history of diabetes. Stress and irregular eating habits, such as skipping meals, might have contributed to her condition.  Her social history includes a high-stress job at the [employer], where she has worked for 27 years. 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 She reports not having much time for exercise but does floor exercises and uses a treadmill when possible.         ALLERGIES:  is allergic to adhesive [tape], levothyroxine  sodium, penicillin g, and penicillins.  MEDICATIONS:  Current Outpatient Medications  Medication Sig Dispense Refill   levothyroxine  (SYNTHROID ) 150 MCG tablet Take 1 tablet (150 mcg total) by mouth daily before breakfast.     tamoxifen  (NOLVADEX ) 20 MG tablet Take 1 tablet (20 mg total) by mouth daily. 90 tablet 3   Vitamin D , Ergocalciferol , (DRISDOL) 50000 units CAPS capsule TK ONE C PO TWICE A WEEK  4   No current facility-administered medications for this visit.    PHYSICAL EXAMINATION: ECOG PERFORMANCE STATUS: 1 - Symptomatic but completely ambulatory  Vitals:   12/21/23 0814  BP: 135/70  Pulse: 100  Resp: 18  Temp: 97.9 F (36.6 C)  SpO2: 100%   Filed Weights   12/21/23 0814  Weight: 211 lb 3.2 oz (95.8 kg)    Physical Exam no palpable lumps or nodules of concern        (exam performed in the presence of a chaperone)  LABORATORY DATA:  I have reviewed the  data as listed    Latest Ref Rng & Units 06/13/2018    2:13 PM 04/17/2017    3:01 PM 03/06/2017    3:00 PM  CMP  Glucose 70 - 99 mg/dL 856  861  92   BUN 6 - 20 mg/dL 19  86.2  83.4   Creatinine 0.44 - 1.00 mg/dL 8.93  0.9  0.8   Sodium 135 - 145 mmol/L 140  143  142   Potassium 3.5 - 5.1 mmol/L 4.1  4.0  4.0   Chloride 98 - 111 mmol/L 110     CO2 22 - 32 mmol/L 23  25  25    Calcium 8.9 - 10.3 mg/dL 9.2  9.0  8.9   Total Protein 6.5 - 8.1 g/dL 7.5  7.1  7.0   Total Bilirubin 0.3 - 1.2 mg/dL 0.4  9.51  9.73   Alkaline Phos 38 - 126 U/L 69  80  69   AST 15 - 41 U/L 24  13  15    ALT 0 - 44 U/L 23  15  13      Lab Results  Component Value Date   WBC 9.2 06/13/2018   HGB 12.7 06/13/2018   HCT 38.6 06/13/2018   MCV 86.5 06/13/2018   PLT 202 06/13/2018   NEUTROABS 5.0 06/13/2018    ASSESSMENT & PLAN:  Breast cancer of upper-inner quadrant of right female breast (HCC) Right lumpectomy 03/01/2016: ILC, grade 2, 0.8 cm, ALH, additional superior margin : ILC grade to 0.3 cm , positive superior margin , 0/1 LN neg, mammoplasty bil : benign, ER 95%, PR 95%, HER-2 positive on primary tumor and negative on secondary mass which had a ratio of 1.55, gene copy number is 2.25, Ki-67 5%, T1bN0 stage IA   HER-2: HER-2 was positive in the original tumor and the HER-2 on the second excision was negative. I was able to get this clarification from pathology.  Reexcision of superior margin: 5 2017: Benign   Treatment plan: 1. Adjuvant therapy with Taxol  and Herceptin  weekly 12  completed 07/06/2016 2. Followed by Herceptin  maintenance every 3 weeks 1 year (was on hold 06/29/2016 to 08/08/2016 due to a decrease in ejection fraction) completed June 2018 2. Followed adjuvant radiation 08/10/2016 to 09/16/2016 3. Antiestrogen therapy With tamoxifen  started January 2018 (patient's last menstrual cycle was June  2017) ----------------------------------------------------------------------------------------------------------------------------- Current treatment: Tamoxifen  20 mg daily started January 2018 (10 years recommended) We discussed the role of breast cancer index and patient is fine to continuing this medication but because she does not have any side effects.   Tamoxifen  Toxicities: No further problems with hot flashes or myalgias.   Breast cancer surveillance: 1. Mammogram 05/08/2023 no evidence of malignancy breast density category B 2. Breast exam 12/21/2023: Benign   She has been busy doing event management.  She has a major event coming up next week and she is very stressed about it. Recent diagnosis of diabetes: Currently on insulin with A1c of 10.  Return to clinic in 1 year for follow-up   No  orders of the defined types were placed in this encounter.  The patient has a good understanding of the overall plan. she agrees with it. she will call with any problems that may develop before the next visit here. Total time spent: 30 mins including face to face time and time spent for planning, charting and co-ordination of care   Naomi MARLA Chad, MD 12/21/23

## 2023-12-22 ENCOUNTER — Telehealth: Payer: Self-pay | Admitting: Hematology and Oncology

## 2023-12-22 NOTE — Telephone Encounter (Signed)
Scheduled appointment per 2/6 los. Patient is aware of the made appointment.

## 2024-04-09 ENCOUNTER — Other Ambulatory Visit: Payer: Self-pay | Admitting: Obstetrics and Gynecology

## 2024-04-09 DIAGNOSIS — Z1231 Encounter for screening mammogram for malignant neoplasm of breast: Secondary | ICD-10-CM

## 2024-05-08 ENCOUNTER — Ambulatory Visit
Admission: RE | Admit: 2024-05-08 | Discharge: 2024-05-08 | Discharge status: Home / Self Care | Source: Ambulatory Visit | Attending: Obstetrics and Gynecology | Admitting: Obstetrics and Gynecology

## 2024-05-08 DIAGNOSIS — Z1231 Encounter for screening mammogram for malignant neoplasm of breast: Secondary | ICD-10-CM

## 2024-12-23 ENCOUNTER — Inpatient Hospital Stay: Payer: Commercial Managed Care - PPO | Admitting: Hematology and Oncology
